# Patient Record
Sex: Female | Born: 1950 | Race: White | Hispanic: No | Marital: Single | Smoking: Never smoker
Health system: Southern US, Community
[De-identification: ages and names within clinical notes are randomized; demographics above are authoritative.]

## PROBLEM LIST (undated history)

## (undated) DIAGNOSIS — E46 Unspecified protein-calorie malnutrition: Secondary | ICD-10-CM

## (undated) DIAGNOSIS — D649 Anemia, unspecified: Secondary | ICD-10-CM

## (undated) DIAGNOSIS — E7800 Pure hypercholesterolemia, unspecified: Secondary | ICD-10-CM

## (undated) DIAGNOSIS — I5032 Chronic diastolic (congestive) heart failure: Secondary | ICD-10-CM

## (undated) DIAGNOSIS — J189 Pneumonia, unspecified organism: Secondary | ICD-10-CM

## (undated) DIAGNOSIS — E440 Moderate protein-calorie malnutrition: Secondary | ICD-10-CM

## (undated) DIAGNOSIS — J9601 Acute respiratory failure with hypoxia: Secondary | ICD-10-CM

## (undated) DIAGNOSIS — D72829 Elevated white blood cell count, unspecified: Secondary | ICD-10-CM

## (undated) DIAGNOSIS — R0902 Hypoxemia: Secondary | ICD-10-CM

## (undated) DIAGNOSIS — R002 Palpitations: Secondary | ICD-10-CM

## (undated) DIAGNOSIS — J849 Interstitial pulmonary disease, unspecified: Secondary | ICD-10-CM

## (undated) DIAGNOSIS — I7300 Raynaud's syndrome without gangrene: Secondary | ICD-10-CM

## (undated) DIAGNOSIS — I272 Pulmonary hypertension, unspecified: Secondary | ICD-10-CM

## (undated) DIAGNOSIS — R6510 Systemic inflammatory response syndrome (SIRS) of non-infectious origin without acute organ dysfunction: Secondary | ICD-10-CM

## (undated) HISTORY — DX: Chronic diastolic (congestive) heart failure: I50.32

## (undated) HISTORY — DX: Palpitations: R00.2

## (undated) HISTORY — DX: Elevated white blood cell count, unspecified: D72.829

## (undated) HISTORY — DX: Anemia, unspecified: D64.9

## (undated) HISTORY — DX: Raynaud's syndrome without gangrene: I73.00

## (undated) HISTORY — DX: Interstitial pulmonary disease, unspecified: J84.9

## (undated) HISTORY — DX: Hypoxemia: R09.02

## (undated) HISTORY — DX: Moderate protein-calorie malnutrition: E44.0

## (undated) HISTORY — DX: Pneumonia, unspecified organism: J18.9

## (undated) HISTORY — DX: Pure hypercholesterolemia, unspecified: E78.00

## (undated) HISTORY — DX: Unspecified protein-calorie malnutrition: E46

## (undated) HISTORY — DX: Systemic inflammatory response syndrome (sirs) of non-infectious origin without acute organ dysfunction: R65.10

## (undated) HISTORY — DX: Acute respiratory failure with hypoxia: J96.01

## (undated) HISTORY — DX: Pulmonary hypertension, unspecified: I27.20

---

## 1978-03-30 HISTORY — PX: TUBAL LIGATION: SHX77

## 2002-06-17 ENCOUNTER — Encounter: Payer: Self-pay | Admitting: Emergency Medicine

## 2002-06-17 ENCOUNTER — Emergency Department (HOSPITAL_COMMUNITY): Admission: EM | Admit: 2002-06-17 | Discharge: 2002-06-17 | Payer: Self-pay | Admitting: Emergency Medicine

## 2011-05-09 ENCOUNTER — Emergency Department (HOSPITAL_COMMUNITY): Payer: BC Managed Care – PPO

## 2011-05-09 ENCOUNTER — Emergency Department (HOSPITAL_COMMUNITY)
Admission: EM | Admit: 2011-05-09 | Discharge: 2011-05-09 | Disposition: A | Discharge status: Home / Self Care | Payer: BC Managed Care – PPO | Attending: Emergency Medicine | Admitting: Emergency Medicine

## 2011-05-09 DIAGNOSIS — R0602 Shortness of breath: Secondary | ICD-10-CM | POA: Insufficient documentation

## 2011-05-09 DIAGNOSIS — J189 Pneumonia, unspecified organism: Secondary | ICD-10-CM | POA: Insufficient documentation

## 2011-05-09 DIAGNOSIS — M549 Dorsalgia, unspecified: Secondary | ICD-10-CM | POA: Insufficient documentation

## 2011-05-09 DIAGNOSIS — R079 Chest pain, unspecified: Secondary | ICD-10-CM | POA: Insufficient documentation

## 2011-05-09 DIAGNOSIS — J8410 Pulmonary fibrosis, unspecified: Secondary | ICD-10-CM | POA: Insufficient documentation

## 2011-05-09 LAB — CBC
HCT: 37.9 % (ref 36.0–46.0)
Hemoglobin: 12.7 g/dL (ref 12.0–15.0)
MCH: 30.1 pg (ref 26.0–34.0)
MCHC: 33.5 g/dL (ref 30.0–36.0)
MCV: 89.8 fL (ref 78.0–100.0)
Platelets: 256 10*3/uL (ref 150–400)
RBC: 4.22 MIL/uL (ref 3.87–5.11)
RDW: 12 % (ref 11.5–15.5)
WBC: 8.8 10*3/uL (ref 4.0–10.5)

## 2011-05-09 LAB — BASIC METABOLIC PANEL
BUN: 13 mg/dL (ref 6–23)
CO2: 28 mEq/L (ref 19–32)
Calcium: 9 mg/dL (ref 8.4–10.5)
Chloride: 97 mEq/L (ref 96–112)
Creatinine, Ser: <0.47 mg/dL (ref 0.4–1.2)
Glucose, Bld: 92 mg/dL (ref 70–99)
Potassium: 4 mEq/L (ref 3.5–5.1)
Sodium: 135 mEq/L (ref 135–145)

## 2011-05-09 LAB — POCT CARDIAC MARKERS
CKMB, poc: <1 ng/mL — ABNORMAL LOW (ref 1.0–8.0)
Myoglobin, poc: 44.4 ng/mL (ref 12–200)
Troponin i, poc: <0.05 ng/mL (ref 0.00–0.09)

## 2011-05-09 LAB — DIFFERENTIAL
Basophils Absolute: 0 10*3/uL (ref 0.0–0.1)
Basophils Relative: 0 % (ref 0–1)
Eosinophils Absolute: 0.1 10*3/uL (ref 0.0–0.7)
Eosinophils Relative: 1 % (ref 0–5)
Lymphocytes Relative: 27 % (ref 12–46)
Lymphs Abs: 2.4 10*3/uL (ref 0.7–4.0)
Monocytes Absolute: 0.7 10*3/uL (ref 0.1–1.0)
Monocytes Relative: 8 % (ref 3–12)
Neutro Abs: 5.6 10*3/uL (ref 1.7–7.7)
Neutrophils Relative %: 64 % (ref 43–77)

## 2011-05-09 MED ORDER — IOHEXOL 300 MG/ML  SOLN
80.0000 mL | Freq: Once | INTRAMUSCULAR | Status: AC | PRN
  Administered 2011-05-09: 80 mL via INTRAVENOUS

## 2011-06-10 ENCOUNTER — Encounter: Payer: Self-pay | Admitting: Emergency Medicine

## 2011-06-13 ENCOUNTER — Ambulatory Visit (INDEPENDENT_AMBULATORY_CARE_PROVIDER_SITE_OTHER): Payer: BC Managed Care – PPO | Admitting: Emergency Medicine

## 2011-06-13 ENCOUNTER — Encounter: Payer: Self-pay | Admitting: Emergency Medicine

## 2011-06-13 ENCOUNTER — Other Ambulatory Visit: Payer: BC Managed Care – PPO

## 2011-06-13 VITALS — BP 108/66 | HR 69 | Temp 97.9°F | Ht 60.5 in | Wt 142.0 lb

## 2011-06-13 DIAGNOSIS — J849 Interstitial pulmonary disease, unspecified: Secondary | ICD-10-CM | POA: Insufficient documentation

## 2011-06-13 DIAGNOSIS — J84115 Respiratory bronchiolitis interstitial lung disease: Secondary | ICD-10-CM

## 2011-06-13 NOTE — Patient Instructions (Signed)
We will do bloodwork today Follow up with Dr Delton Coombes next available appointment to review the results.

## 2011-06-13 NOTE — Progress Notes (Signed)
  Subjective:    Patient ID: Julie Page, female    DOB: 1951-08-09, 60 y.o.   MRN: [id number]  HPI 60 yo never smoker, hx raynaud's, hypercholesterolemia. She has also been treated for CAP on at least 2 -3 occasions, last was May 2012. On that occasion she was rx Avelox. F/u spiro was normal at urgent care. CT-PA 05/09/11 showed some very subtle subpleural interstitial disease. She is referred regarding the CT scan. ROS significant for chronic intermittant dry cough.    Review of Systems  Respiratory: Positive for cough and shortness of breath.    Past Medical History  Diagnosis Date  . Raynaud's syndrome   . Hypercholesteremia      Family History  Problem Relation Age of Onset  . Heart disease Mother   . Heart disease Maternal Grandfather   . Heart disease Father      History   Social History  . Marital Status: Divorced    Spouse Name: N/A    Number of Children: N/A  . Years of Education: N/A   Occupational History  . bookkeeper/accountant    Social History Main Topics  . Smoking status: Never Smoker   . Smokeless tobacco: Not on file  . Alcohol Use: No     rare ETOH use  . Drug Use: No  . Sexually Active: Not on file   Other Topics Concern  . Not on file   Social History Narrative  . No narrative on file     No Known Allergies   No outpatient prescriptions prior to visit.         Objective:   Physical Exam Gen: Pleasant, well-nourished, in no distress,  normal affect  ENT: No lesions,  mouth clear,  oropharynx clear, no postnasal drip  Neck: No JVD, no TMG, no carotid bruits  Lungs: No use of accessory muscles, no dullness to percussion, clear without rales or rhonchi  Cardiovascular: RRR, heart sounds normal, no murmur or gallops, no peripheral edema  Musculoskeletal: No deformities, no cyanosis or clubbing  Neuro: alert, non focal  Skin: Warm, no lesions or rashes        Assessment & Plan:  Interstitial lung disease Will order  auto-immune labs ROV next available to review

## 2011-06-13 NOTE — Assessment & Plan Note (Signed)
Will order auto-immune labs ROV next available to review

## 2011-06-14 LAB — ANA: Anti Nuclear Antibody(ANA): NEGATIVE

## 2011-06-17 ENCOUNTER — Telehealth: Payer: Self-pay | Admitting: Emergency Medicine

## 2011-06-17 NOTE — Telephone Encounter (Signed)
Discussed lab results w pt - all the autoimmune labs are negative w exception of borderline elevated RF. In absence of joint sx significance of this is questionable. I will see her back to decide if any other eval needed for this, and to plan timing of further imaging.

## 2011-06-28 ENCOUNTER — Ambulatory Visit: Payer: BC Managed Care – PPO | Admitting: Emergency Medicine

## 2011-07-19 ENCOUNTER — Ambulatory Visit (INDEPENDENT_AMBULATORY_CARE_PROVIDER_SITE_OTHER): Payer: BC Managed Care – PPO | Admitting: Emergency Medicine

## 2011-08-09 ENCOUNTER — Ambulatory Visit (INDEPENDENT_AMBULATORY_CARE_PROVIDER_SITE_OTHER): Payer: BC Managed Care – PPO | Admitting: Emergency Medicine

## 2011-08-09 ENCOUNTER — Encounter: Payer: Self-pay | Admitting: Emergency Medicine

## 2011-08-09 VITALS — BP 116/78 | HR 88 | Temp 97.8°F | Ht 60.5 in | Wt 145.2 lb

## 2011-08-09 DIAGNOSIS — J849 Interstitial pulmonary disease, unspecified: Secondary | ICD-10-CM

## 2011-08-09 DIAGNOSIS — J84115 Respiratory bronchiolitis interstitial lung disease: Secondary | ICD-10-CM

## 2011-08-09 NOTE — Patient Instructions (Signed)
We will perform full pulmonary function testing on the date of your next visit We will repeat your CXR or your CT scan in several months to compare with your prior Follow up with Dr Delton Coombes next available with PFT's

## 2011-08-09 NOTE — Assessment & Plan Note (Signed)
Full PFT RF slightly elevated, ? Significance Will repeat CXR or CT scan, determine timing based on her clinical status rov next available.

## 2011-08-09 NOTE — Progress Notes (Signed)
  Subjective:    Patient ID: Julie Page, female    DOB: Oct 12, 1951, 60 y.o.   MRN: [id number]  HPI 60 yo never smoker, hx raynaud's, hypercholesterolemia. She has also been treated for CAP on at least 2 -3 occasions, last was May 2012. On that occasion she was rx Avelox. F/u spiro was normal at urgent care. CT-PA 05/09/11 showed some very subtle subpleural interstitial disease. She is referred regarding the CT scan. ROS significant for chronic intermittant dry cough.   ROV 08/09/11 -- follows up for her mild ILD on CT scan. Auto-immune labs negative with exception of RF of 24 (very mildly elevated).  She has not had any arthritic symptoms. She has continued to have cough, especially in the am productive of clear or yellow mucous. She hears some inspiratory wheeze with exertion.  Seems to happen at random, often in the am. She c/o fatigue since our last visit.    Review of Systems  Respiratory: Positive for cough and shortness of breath.       Objective:   Physical Exam Gen: Pleasant, well-nourished, in no distress,  normal affect  ENT: No lesions,  mouth clear,  oropharynx clear, no postnasal drip  Neck: No JVD, no TMG, no carotid bruits  Lungs: No use of accessory muscles, no dullness to percussion, clear without rales or rhonchi  Cardiovascular: RRR, heart sounds normal, no murmur or gallops, no peripheral edema  Musculoskeletal: No deformities, no cyanosis or clubbing  Neuro: alert, non focal  Skin: Warm, no lesions or rashes     Assessment & Plan:

## 2011-09-08 ENCOUNTER — Encounter: Payer: Self-pay | Admitting: Emergency Medicine

## 2011-09-08 ENCOUNTER — Ambulatory Visit (INDEPENDENT_AMBULATORY_CARE_PROVIDER_SITE_OTHER): Payer: BC Managed Care – PPO | Admitting: Emergency Medicine

## 2011-09-08 VITALS — BP 138/72 | HR 85 | Temp 97.7°F | Ht 60.5 in | Wt 143.8 lb

## 2011-09-08 DIAGNOSIS — J84115 Respiratory bronchiolitis interstitial lung disease: Secondary | ICD-10-CM

## 2011-09-08 DIAGNOSIS — J849 Interstitial pulmonary disease, unspecified: Secondary | ICD-10-CM

## 2011-09-08 LAB — PULMONARY FUNCTION TEST

## 2011-09-08 NOTE — Assessment & Plan Note (Signed)
PFT reassuring - repeat CT scan in April or may 2013 - rov to review after

## 2011-09-08 NOTE — Patient Instructions (Signed)
Your pulmonary function testing today was normal We will repeat your CT scan of the chest in April 2013 Follow up with Dr Delton Coombes in April after your scan, or sooner if you have any problems.

## 2011-09-08 NOTE — Progress Notes (Signed)
PFT done today. 

## 2011-09-08 NOTE — Progress Notes (Signed)
  Subjective:    Patient ID: Julie Page, female    DOB: 16-Oct-1951, 60 y.o.   MRN: [id number]  HPI 60 yo never smoker, hx raynaud's, hypercholesterolemia. She has also been treated for CAP on at least 2 -3 occasions, last was May 2012. On that occasion she was rx Avelox. F/u spiro was normal at urgent care. CT-PA 05/09/11 showed some very subtle subpleural interstitial disease. She is referred regarding the CT scan. ROS significant for chronic intermittant dry cough.   ROV 08/09/11 -- follows up for her mild ILD on CT scan. Auto-immune labs negative with exception of RF of 24 (very mildly elevated).  She has not had any arthritic symptoms. She has continued to have cough, especially in the am productive of clear or yellow mucous. She hears some inspiratory wheeze with exertion.  Seems to happen at random, often in the am. She c/o fatigue since our last visit.   ROV 09/08/11 -- follow up for SOB, mild ILD (mild RF+). She tells me that she still has exertional SOB w stairs and hills. Still has cough, usually worst in am, often productive. She is beginning to get some URI sx, sore throat, has sick contact at work. She had PFT today - show normal AF, no BD response, normal volumes, DLCO that corrects for volumes.    Review of Systems  Respiratory: Positive for cough and shortness of breath.       Objective:   Physical Exam Gen: Pleasant, well-nourished, in no distress,  normal affect  ENT: No lesions,  mouth clear,  oropharynx clear, no postnasal drip  Neck: No JVD, no TMG, no carotid bruits  Lungs: No use of accessory muscles, no dullness to percussion, clear without rales or rhonchi  Cardiovascular: RRR, heart sounds normal, no murmur or gallops, no peripheral edema  Musculoskeletal: No deformities, no cyanosis or clubbing  Neuro: alert, non focal  Skin: Warm, no lesions or rashes     Assessment & Plan:  Interstitial lung disease PFT reassuring - repeat CT scan in April or may  2013 - rov to review after

## 2012-03-07 ENCOUNTER — Ambulatory Visit (INDEPENDENT_AMBULATORY_CARE_PROVIDER_SITE_OTHER): Payer: BC Managed Care – PPO | Admitting: Internal Medicine

## 2012-03-07 VITALS — BP 123/73 | HR 101 | Temp 98.1°F | Resp 16 | Ht 61.0 in | Wt 141.4 lb

## 2012-03-07 DIAGNOSIS — J209 Acute bronchitis, unspecified: Secondary | ICD-10-CM

## 2012-03-07 DIAGNOSIS — J40 Bronchitis, not specified as acute or chronic: Secondary | ICD-10-CM

## 2012-03-07 MED ORDER — AZITHROMYCIN 250 MG PO TABS: ORAL_TABLET | ORAL | Status: AC

## 2012-03-07 MED ORDER — HYDROCODONE-ACETAMINOPHEN 7.5-500 MG/15ML PO SOLN: 5.0000 mL | Freq: Four times a day (QID) | ORAL | Status: AC | PRN

## 2012-03-07 NOTE — Progress Notes (Signed)
  Subjective:    Patient ID: Julie Page, female    DOB: [birth year], 61 y.o.   MRN: [id number]  HPI Has fever, cough, sputum yellow, head congestion, sinus. No CP or SOB. OX 96%   Review of Systems Neg    Objective:   Physical Exam Nasal congestion Lungs coarse bs no rales. Heart normal        Assessment & Plan:  Zpak and Lortab elixir 6oz prn

## 2012-03-07 NOTE — Patient Instructions (Signed)

## 2012-03-15 ENCOUNTER — Telehealth: Payer: Self-pay | Admitting: Emergency Medicine

## 2012-03-15 NOTE — Telephone Encounter (Signed)
Spoke with pt and she states just needing to reschedule her ct chest for a later date. I gave her the number to call Rose and reschedule. Pt verbalized understanding and states nothing further needed. Will forward to [facility] per her request to check and make sure she reschedules. Thanks!

## 2012-03-19 ENCOUNTER — Ambulatory Visit (INDEPENDENT_AMBULATORY_CARE_PROVIDER_SITE_OTHER): Payer: BC Managed Care – PPO | Admitting: Family Medicine

## 2012-03-19 VITALS — BP 141/72 | HR 81 | Temp 98.9°F | Resp 16 | Ht 59.38 in | Wt 141.6 lb

## 2012-03-19 DIAGNOSIS — R5381 Other malaise: Secondary | ICD-10-CM

## 2012-03-19 DIAGNOSIS — R05 Cough: Secondary | ICD-10-CM

## 2012-03-19 DIAGNOSIS — R5383 Other fatigue: Secondary | ICD-10-CM

## 2012-03-19 DIAGNOSIS — J029 Acute pharyngitis, unspecified: Secondary | ICD-10-CM

## 2012-03-19 LAB — POCT CBC
Granulocyte percent: 58.5 %G (ref 37–80)
MCH, POC: 29.2 pg (ref 27–31.2)
MCV: 92.1 fL (ref 80–97)
MID (cbc): 0.7 (ref 0–0.9)
MPV: 8.3 fL (ref 0–99.8)
POC LYMPH PERCENT: 35.7 %L (ref 10–50)
POC MID %: 5.8 %M (ref 0–12)
Platelet Count, POC: 510 10*3/uL — AB (ref 142–424)
RBC: 4.15 M/uL (ref 4.04–5.48)
RDW, POC: 13.2 %

## 2012-03-19 MED ORDER — IPRATROPIUM BROMIDE 0.03 % NA SOLN: 2.0000 | Freq: Four times a day (QID) | NASAL | Status: DC

## 2012-03-19 NOTE — Progress Notes (Signed)
Patient Name: Julie Page Date of Birth: [birth year] Medical Record Number: [id number] Gender: female Date of Encounter: 03/19/2012  History of Present Illness:  Julie Page is a 61 y.o. very pleasant female patient who presents with the following:  Here on 03/07/12 and treated for bronchitis with a zpack. She had felt better, but never got to 100%.  Started to feel worse- tired, coughing- over the last 3 days.  Then yesterday noted onset of ST and PND.  Felt a little tight in her chest.  No fever that she has noticed.  No aches, no body aches.  Cough is dry- not productive.  No GI symptoms.  No runny nose, no sneezing.   Patient Active Problem List  Diagnoses  . Interstitial lung disease   Past Medical History  Diagnosis Date  . Raynaud's syndrome   . Hypercholesteremia    Past Surgical History  Procedure Date  . Tubal ligation 03/30/1978   History  Substance Use Topics  . Smoking status: Never Smoker   . Smokeless tobacco: Never Used  . Alcohol Use: No     rare ETOH use   Family History  Problem Relation Age of Onset  . Heart disease Mother   . Heart disease Maternal Grandfather   . Heart disease Father    No Known Allergies  Medication list has been reviewed and updated.  Review of Systems: As per HPI- otherwise negative. Main issue today is just feeling tired and bad  Physical Examination: Filed Vitals:   03/19/12 1442  BP: 141/72  Pulse: 81  Temp: 98.9 F (37.2 C)  TempSrc: Oral  Resp: 16  Height: 4' 11.38" (1.508 m)  Weight: 141 lb 9.6 oz (64.229 kg)   O2 sat 100% Body mass index is 28.23 kg/(m^2).  GEN: WDWN, NAD, Non-toxic, A & O x 3, looks well HEENT: Atraumatic, Normocephalic. Neck supple. No masses, No LAD.  TM, oropharynx wnl Ears and Nose: No external deformity. CV: RRR, No M/G/R. No JVD. No thrill. No extra heart sounds. PULM: CTA B, no wheezes, crackles, rhonchi. No retractions. No resp. distress. No accessory muscle use. EXTR:  No c/c/e NEURO Normal gait.  PSYCH: Normally interactive. Conversant. Not depressed or anxious appearing.  Calm demeanor.   Results for orders placed in visit on 03/19/12  POCT INFLUENZA A/B      Component Value Range   Influenza A, POC Negative     Influenza B, POC Negative    POCT RAPID STREP A (OFFICE)      Component Value Range   Rapid Strep A Screen Negative  Negative   POCT CBC      Component Value Range   WBC 11.3 (*) 4.6 - 10.2 (K/uL)   Lymph, poc 4.0 (*) 0.6 - 3.4    POC LYMPH PERCENT 35.7  10 - 50 (%L)   MID (cbc) 0.7  0 - 0.9    POC MID % 5.8  0 - 12 (%M)   POC Granulocyte 6.6  2 - 6.9    Granulocyte percent 58.5  37 - 80 (%G)   RBC 4.15  4.04 - 5.48 (M/uL)   Hemoglobin 12.1 (*) 12.2 - 16.2 (g/dL)   HCT, POC 40.9  81.1 - 47.9 (%)   MCV 92.1  80 - 97 (fL)   MCH, POC 29.2  27 - 31.2 (pg)   MCHC 31.7 (*) 31.8 - 35.4 (g/dL)   RDW, POC 91.4     Platelet Count, POC 510 (*) 142 -  424 (K/uL)   MPV 8.3  0 - 99.8 (fL)     Assessment and Plan: 1. Fatigue  POCT Influenza A/B, POCT rapid strep A, POCT CBC  2. Cough  POCT Influenza A/B, POCT CBC  3. Sore throat     likley viral URI.  DMM, atrovent nasal prn. Buy thermometer and check temp.  If not feeling better within a couple of days please call- Sooner if worse.   Recommended a recheck CBC at her next blood draw to recheck platelets and wbc

## 2012-03-21 NOTE — Telephone Encounter (Signed)
Spoke with pt and she decided to keep appt for ct on 03/22/12.

## 2012-03-22 ENCOUNTER — Ambulatory Visit (INDEPENDENT_AMBULATORY_CARE_PROVIDER_SITE_OTHER)
Admission: RE | Admit: 2012-03-22 | Discharge: 2012-03-22 | Disposition: A | Discharge status: Home / Self Care | Payer: BC Managed Care – PPO | Source: Ambulatory Visit | Attending: Emergency Medicine | Admitting: Emergency Medicine

## 2012-03-22 DIAGNOSIS — J849 Interstitial pulmonary disease, unspecified: Secondary | ICD-10-CM

## 2012-03-22 DIAGNOSIS — J8410 Pulmonary fibrosis, unspecified: Secondary | ICD-10-CM

## 2012-03-29 ENCOUNTER — Telehealth: Payer: Self-pay | Admitting: Emergency Medicine

## 2012-03-29 NOTE — Telephone Encounter (Signed)
Pt calling for ct results.  Please advise. thanks

## 2012-03-29 NOTE — Telephone Encounter (Signed)
Called and spoke with pt and she is aware of ct results per RB.  She is aware of appt on 4/22 pt to arrive at 8:45.  Pt is aware. Nothing further needed.

## 2012-03-29 NOTE — Telephone Encounter (Signed)
lori can you give me an appt for this pt to come in and see RB to discuss her ct results.  thanks

## 2012-03-29 NOTE — Telephone Encounter (Signed)
Please inform patient that her CT scan shows some slight progression of scarring and inflammation compared with 04/2011. She needs to make follow up with me so we can discuss, decide whether we need to do any other evaluation of this.

## 2012-04-09 ENCOUNTER — Ambulatory Visit (INDEPENDENT_AMBULATORY_CARE_PROVIDER_SITE_OTHER): Payer: BC Managed Care – PPO | Admitting: Emergency Medicine

## 2012-04-09 ENCOUNTER — Encounter: Payer: Self-pay | Admitting: Emergency Medicine

## 2012-04-09 ENCOUNTER — Other Ambulatory Visit: Payer: BC Managed Care – PPO

## 2012-04-09 VITALS — BP 100/64 | HR 93 | Temp 99.0°F | Ht 60.5 in | Wt 140.8 lb

## 2012-04-09 DIAGNOSIS — J849 Interstitial pulmonary disease, unspecified: Secondary | ICD-10-CM

## 2012-04-09 DIAGNOSIS — R05 Cough: Secondary | ICD-10-CM

## 2012-04-09 DIAGNOSIS — J8410 Pulmonary fibrosis, unspecified: Secondary | ICD-10-CM

## 2012-04-09 MED ORDER — OMEPRAZOLE 20 MG PO CPDR: 20.0000 mg | DELAYED_RELEASE_CAPSULE | Freq: Every day | ORAL | Status: DC

## 2012-04-09 NOTE — Assessment & Plan Note (Signed)
She relates to when she went to new workplace 7 yrs ago, has had episodes pleurisy, PNA x 2. Now the CT scan that shows peripheral ILD. Her RF is elevated, other auto-immune labs negative.  - will try to send sputum for cx, if unsuccessful then consider FOB for BAL - strongly consider VATS bx to eval ILD.  - resend RF and consider referral to rheum or empiric steroids.

## 2012-04-09 NOTE — Patient Instructions (Signed)
Please start taking omeprazole 20mg  daily until next visit We will recheck rheumatoid factor today We will refer to thoracici surgery to discuss the possible benefits of a lung biopsy Follow with Dr Delton Coombes in 1 month

## 2012-04-09 NOTE — Progress Notes (Signed)
Subjective:    Patient ID: Julie Page, female    DOB: [birth year], 61 y.o.   MRN: [id number]  HPI 61 yo never smoker, hx raynaud's, hypercholesterolemia. She has also been treated for CAP on at least 2 -3 occasions, last was May 2012. On that occasion she was rx Avelox. F/u spiro was normal at urgent care. CT-PA 05/09/11 showed some very subtle subpleural interstitial disease. She is referred regarding the CT scan. ROS significant for chronic intermittant dry cough.   ROV 08/09/11 -- follows up for her mild ILD on CT scan. Auto-immune labs negative with exception of RF of 24 (very mildly elevated).  She has not had any arthritic symptoms. She has continued to have cough, especially in the am productive of clear or yellow mucous. She hears some inspiratory wheeze with exertion.  Seems to happen at random, often in the am. She c/o fatigue since our last visit.   ROV 09/08/11 -- follow up for SOB, mild ILD (mild RF+). She tells me that she still has exertional SOB w stairs and hills. Still has cough, usually worst in am, often productive. She is beginning to get some URI sx, sore throat, has sick contact at work. She had PFT today - show normal AF, no BD response, normal volumes, DLCO that corrects for volumes.   ROV 04/09/12  --  follow up for SOB, mild ILD (mild RF+). CT scan was done 03/22/12 that showed some slight progression of peripheral ILD with some associated bronchiectasis. PFT 09/08/11 =  normal AF, no BD response, normal volumes, DLCO that corrects for volumes. Tells me that she caught URI and had bronchitis sx in March, rx azithro. Her breathing is stable, still with DOE when climbing stairs. She is having some more cough, mainly in the am usually non-productive. Can relate to meals, no overt aspiration sx but then 10-15 minutes later after meals.   Sputum vs FOB ? Re[peat autoimmune labs/RF Repeat Ct after steroid trial   Review of Systems  Respiratory: Positive for cough and shortness of  breath.       Objective:   Physical Exam Gen: Pleasant, well-nourished, in no distress,  normal affect  ENT: No lesions,  mouth clear,  oropharynx clear, no postnasal drip  Neck: No JVD, no TMG, no carotid bruits  Lungs: No use of accessory muscles, no dullness to percussion, clear without rales or rhonchi  Cardiovascular: RRR, heart sounds normal, no murmur or gallops, no peripheral edema  Musculoskeletal: No deformities, no cyanosis or clubbing  Neuro: alert, non focal  Skin: Warm, no lesions or rashes  CT scan chest 03/22/12: IMPRESSION:  1. Mildly progressive peripheral reticular interstitial  accentuation, with mild associated bronchiectasis. The appearance  raises concern for usual interstitial pneumonitis, potentially  wrist dilated to idiopathic pulmonary fibrosis or collagen vascular  disease. The main differential diagnostic consideration is  nonspecific interstitial pneumonia.  2. Faint mosaic attenuation and portions of the right lung with  expiration, suggesting mild air trapping and raising the  possibility of mild associated respiratory bronchiolitis.  2. Borderline enlarged right paratracheal lymph nodes, potentially  reactive.     Assessment & Plan:  Chronic cough Happens after meals about 10-52min. No PND at this time. No real GERD sx.   Interstitial lung disease She relates to when she went to new workplace 7 yrs ago, has had episodes pleurisy, PNA x 2. Now the CT scan that shows peripheral ILD. Her RF is elevated, other auto-immune labs negative.  -  will try to send sputum for cx, if unsuccessful then consider FOB for BAL - strongly consider VATS bx to eval ILD.  - resend RF and consider referral to rheum or empiric steroids.

## 2012-04-09 NOTE — Assessment & Plan Note (Addendum)
Happens after meals about 10-73min. No PND at this time. No real GERD sx.

## 2012-04-12 ENCOUNTER — Encounter: Payer: Self-pay | Admitting: Cardiothoracic Surgery

## 2012-04-12 ENCOUNTER — Institutional Professional Consult (permissible substitution) (INDEPENDENT_AMBULATORY_CARE_PROVIDER_SITE_OTHER): Payer: BC Managed Care – PPO | Admitting: Cardiothoracic Surgery

## 2012-04-12 VITALS — BP 155/86 | HR 110 | Resp 20 | Ht 60.05 in | Wt 140.0 lb

## 2012-04-12 DIAGNOSIS — J8410 Pulmonary fibrosis, unspecified: Secondary | ICD-10-CM

## 2012-04-12 DIAGNOSIS — J849 Interstitial pulmonary disease, unspecified: Secondary | ICD-10-CM

## 2012-04-12 NOTE — Patient Instructions (Signed)
Idiopathic Pulmonary Fibrosis Idiopathic pulmonary fibrosis is an inflammation (soreness and irritation) in the lungs which eventually causes scarring. This usually shows in middle age over a several year time period. The cause is unknown (idiopathic). Usually death occurs after several years but there are no time tables which will predict perfectly what the course of the illness will be. It affects males and females equally. In this condition there is a formation of fibrous (tough leathery) tissue in the small ducts which carry air to and from your lungs. Because of this, the lungs do not work as well as they should for taking in oxygen from the air you breathe and getting rid of wastes (carbon dioxide). Because the lungs are damaged, there may be more problems with infections or the heart.   Other problems may include pulmonary hypertension (high blood pressure in the lungs), and formation of blood clots. Some symptoms of this illness are:  Coughing and breathing difficulties; cough is usually dry and hacking.   Bluish (cyanotic) skin and lips due to lack of circulating oxygen.   Loss of appetite.   Loss of strength which comes from just the increased work of breathing.   Rapid, shallow breathing occur with moderate exercise and later even while resting.   Increasing shortness of breath (dyspnea) which progresses as the disease gets worse.   Weight loss and fatigue due partly to the increased work of breathing.   Clubbing of the fingers (the ends of the fingers become rounded and enlarged).  DIAGNOSIS   A diagnosis of idiopathic pulmonary fibrosis may be suspected based on exam and a patient's history.  Specialized X-rays, pulmonary function tests, pulse oximetry, and laboratory tests including blood gasses help confirm the problem.   Sometimes a biopsy is done and a small piece of lung tissue is removed. This may be done through a bronchoscope or during an operation in which the chest is  opened. This is looked at under a microscope by a specialist who can tell what the lung problem is.  CAUSES If the cause of pulmonary fibrosis is known, it is no longer known as idiopathic. Several causes of pulmonary fibrosis include:  Occupational and environmental exposures to asbestos, silica, and or metal dusts.   Illegal or street drug use.   Agricultural workers may inhale substances, such as moldy hay, which can cause an allergic reaction in the lung. This reaction is called Farmer's Lung and can cause pulmonary fibrosis. Some other fumes found on farms are directly toxic to the lungs.   Exposure of the lungs to radiation.   Collagen diseases.   Sarcoidosis is a disease which forms granulomas (areas of inflammatory cells), which can attack any area of the body but most frequently affects the lungs.   Drugs. Certain medicines may have the undesirable side effect of causing pulmonary fibrosis. Check with your doctor about the medicines you are taking and ask about any possible side effects.   Some cases of pulmonary fibrosis seem to be genetic.  TREATMENT    There are no drugs currently approved for the treatment of pulmonary fibrosis. Steroids (a potent medication which cuts down on inflammation) are sometimes given to prevent lung changes before they become permanent. High doses may be recommended at first, followed by lower maintenance dosages. Other medications may be tried if steroids do not work.   Lung disease may be monitored with X-rays and laboratory work.   Oxygen may be helpful if oxygen in the blood is diminished.  This improves the quality of life. Your caregiver will give you a prescription for this if it is helpful.   Antibiotics are used for treatment of infections.   Exercise may be beneficial.   Lung transplants are being investigated and a single lung transplant may be considered for some patients.   Influenza vaccine and pneumococcal pneumonia vaccine are  both recommended for people with IPF or any lung disease. These two shots may help keep you healthy.  Document Released: 02/25/2004 Document Revised: 11/24/2011 Document Reviewed: 12/05/2005 [facility] Patient Information 2012 [place], [place].Thoracoscopy Thoracoscopy is a procedure in which a thin, lighted tube (thoracoscope) is put through a small cut (incision) in the chest wall. This procedure makes it possible for your caregiver to look at the lungs or other structures in the chest cavity and to do some minor operations. This is a more minor procedure than thoracotomy, which opens the chest cavity with a large incision. Thoracoscopy can sometimes be used instead of thoracotomy. Thoracoscopy usually involves less pain, a shorter hospital stay, and a shorter recovery time. Common reasons for this procedure are:  To study diseases or problems in the chest.   To take a tissue sample (biopsy) to study under a microscope.   To put medicines directly into the lungs.   To remove collections of fluid, pus (empyema), or blood in the chest.  LET YOUR CAREGIVER KNOW ABOUT:    Allergies to food or medicine.   Medicines taken, including vitamins, herbs, eyedrops, over-the-counter medicines, and creams.   Use of steroids (by mouth or creams).   Previous problems with anesthetics or numbing medicines.   History of bleeding problems or blood clots.   Previous surgery.   Any history of heart problems.   Other health problems, including diabetes and kidney problems.   Possibility of pregnancy, if this applies.  RISKS AND COMPLICATIONS    If too much bleeding occurs, or if the surgery turns out to be major, it may be necessary to open the chest (thoracotomy) to control the bleeding.   There is a risk of injury to nerves or other structures in the chest as a result of the placement of the instruments.   When the chest tube is removed, the lung may collapse (pneumothorax). If this happens, the  tube may need to be reinserted and left in place until a time when the lung will remain expanded as the tube is removed.  BEFORE THE PROCEDURE    You may have routine tests done, such as blood tests, urine tests, and chest X-rays.   Electrocardiography (EKG) to record the electrical activity of the heart may be done to make sure the heart is okay.   Do not eat or drink after midnight the night before the procedure. Medicine given before the procedure that makes you sleep (general anesthetic) may cause vomiting. A patient who vomits is in danger of inhaling food into the lungs. This can cause serious complications and can be life-threatening.  PROCEDURE   Video-assisted thoracic surgery (VATS) is surgery using a thoracoscope with a small video camera on the end. The picture from inside the chest is displayed on a television screen for the surgeon to see. The lung being worked on is collapsed and several small incisions are made to insert instruments. The surgeon can manipulate the instruments while watching on the television screen. The thoracoscope may be removed and put into different areas as needed. When the procedure is finished, the surgeon expands the lung and  puts one or more chest tubes in the chest. The chest tubes allow the lung to expand and allow fluid (drainage) to come out. The remaining incisions are closed with stitches (sutures) or staples. AFTER THE PROCEDURE    The chest tube is left in place for 1 to several days to drain fluid or air from the chest cavity.   Hospital stays range from 1 to 5 days depending on the procedure and treatment.  Document Released: 09/03/2003 Document Revised: 11/24/2011 Document Reviewed: 05/25/2011 [facility] Patient Information 2012 [place], [place].Thoracoscopy Care After Refer to this sheet in the next few weeks. These discharge instructions provide you with general information on caring for yourself after you leave the hospital. Your caregiver may  also give you specific instructions. Your treatment has been planned according to the most current medical practices available, but unavoidable complications sometimes occur. If you have any problems or questions after discharge, call your caregiver. HOME CARE INSTRUCTIONS    Remove the bandage (dressing) over your chest tube site as directed by your caregiver.   It is normal to be sore for a couple weeks following surgery. See your caregiver if this seems to be getting worse rather than better.   Only take over-the-counter or prescription medicines for pain, discomfort, or fever as directed by your caregiver. It is very important to take pain medicine when you need it so that you will cough and breathe deeply enough to clear mucus (phlegm) and expand your lungs.   If it hurts to cough, hold a pillow against your chest when you cough. This may help with the discomfort. In spite of the discomfort, cough frequently, as this helps protect against getting an infection in your lung (pneumonia).   Taking deep breaths keeps lungs inflated and protects against pneumonia. Most patients will go home with an incentive spirometer that encourages deep breathing.   You may resume a normal diet and activities as directed.   Use showers for bathing until you see your caregiver, or as instructed.   Change dressings if necessary or as directed.   Avoid lifting or driving until you are instructed otherwise.   Make an appointment to see your caregiver for stitch (suture) or staple removal when instructed.   Do not travel by airplane for 2 weeks after the chest tube is removed.  SEEK MEDICAL CARE IF:    You are bleeding from your wounds.   You have redness, swelling, or increasing pain in the wounds.   Your heartbeat feels irregular or very fast.   There is pus coming from your wounds.   There is a bad smell coming from the wound or dressing.  SEEK IMMEDIATE MEDICAL CARE IF:    You have a fever.    You develop a rash.   You have difficulty breathing.   You develop any reaction or side effects to medicines given.   You develop lightheadedness or feel faint.   You develop shortness of breath or chest pain.  MAKE SURE YOU:    Understand these instructions.   Will watch your condition.   Will get help right away if you are not doing well or get worse.  Document Released: 06/24/2005 Document Revised: 11/24/2011 Document Reviewed: 05/25/2011 [facility] Patient Information 2012 [place], [place].

## 2012-04-13 NOTE — Progress Notes (Signed)
[address]            [place] [address]          [phone]      Julie Page [facility] Medical Record #[id number] Date of Birth: 03-08-1951  Referring: Leslye Peer, MD Primary Care: Abbe Amsterdam, MD, MD  Chief Complaint:    Chief Complaint  Patient presents with  . Interstitial Lung Disease    Referral from Dr Delton Coombes for eval on ILD and poss VATS    History of Present Illness:    Patient is a 61 year old nonsmoker with several year history of increasing cough and radiographic findings suggestive of interstitial lung disease. She's been followed in the pulmonary clinic and because of progressive symptoms has been referred for consideration of video-assisted thoracoscopy for lung biopsy. She denies hemoptysis, has never been a smoker. She does have a history of  Raynaud's phenomenon which started about 4 years previous.      Current Activity/ Functional Status: Patient is independent with mobility/ambulation, transfers, ADL's, IADL's.   Past Medical History  Diagnosis Date  . Raynaud's syndrome   . Hypercholesteremia     Past Surgical History  Procedure Date  . Tubal ligation 03/30/1978    Family History  Problem Relation Age of Onset  . Heart disease Mother Died mi 89  . Heart disease Maternal Grandfather   . Heart disease Father Died mi 38    History   Social History  . Marital Status: Divorced    Spouse Name: N/A    Number of Children: Two sons in good health  . Years of Education: N/A   Occupational History  . bookkeeper/accountant    Social History Main Topics  . Smoking status: Never Smoker   . Smokeless tobacco: Never Used  . Alcohol Use: No     rare ETOH use  . Drug Use: No   Other Topics Concern  . Not on file   Social History Narrative  . Patient son lives with her    History  Smoking status  . Never Smoker   Smokeless tobacco  . Never Used    History  Alcohol Use No    rare ETOH  use     No Known Allergies  Current Outpatient Prescriptions  Medication Sig Dispense Refill  . Ascorbic Acid (VITAMIN C) 1000 MG tablet Take 1,000 mg by mouth daily.        Marland Kitchen omeprazole (PRILOSEC) 20 MG capsule Take 1 capsule (20 mg total) by mouth daily.  30 capsule  11       Review of Systems:     Cardiac Review of Systems: Y or N  Chest Pain [  n  ]  Resting SOB [ n  ] Exertional SOB  Cove.Etienne  ]  Orthopnea [ n ]   Pedal Edema [  n ]    Palpitations [ n ] Syncope  [n  ]   Presyncope [n   ]  General Review of Systems: [Y] = yes [  ]=no Constitional: recent weight change [ n ]; anorexia [ n ]; fatigue [ y ]; nausea [ n ]; night sweats [n  ]; fever [n  ]; or chills [ n ];  Dental: poor dentition[ n ];   Eye : blurred vision [n  ]; diplopia [   ]; vision changes [  ];  Amaurosis fugax[n  ]; Resp: cough [  ];  wheezing[  ];  hemoptysis[  ]; shortness of breath[  ]; paroxysmal nocturnal dyspnea[ n ]; dyspnea on exertion[y  ]; or orthopnea[n  ];  GI:  gallstones[  ], vomiting[  ];  dysphagia[  ]; melena[  ];  hematochezia [  ]; heartburn[  ];   Hx of  Colonoscopy[  ]; GU: kidney stones [  ]; hematuria[  ];   dysuria [  ];  nocturia[  ];  history of     obstruction [n  ];             Skin: rash, swelling[  ];, hair loss[  ];  peripheral edema[  ];  or itching[  ]; blanching Musculosketetal: myalgias[  ];  joint swelling[  ];  joint erythema[  ];  joint pain[  ];  back pain[  ];  Heme/Lymph: bruising[  ];  bleeding[  ];  anemia[  ];  Neuro: TIA[  ];  headaches[y  ];  stroke[  ];  vertigo[  ];  seizures[n  ];   paresthesias[  ];  difficulty walking[n  ];  Psych:depression[  ]; anxiety[  ];  Endocrine: diabetes[ n ];  thyroid dysfunction[  ];  Immunizations: Flu [ y ]; Pneumococcal[n  ];  Other:  Physical Exam: BP 155/86  Pulse 110  Resp 20  Ht 5' 0.05"  (1.525 m)  Wt 140 lb (63.504 kg)  BMI 27.30 kg/m2  SpO2 97%  General appearance: alert, cooperative, appears stated age and no distress Neurologic: intact Heart: regular rate and rhythm, S1, S2 normal, no murmur, click, rub or gallop and normal apical impulse Lungs: clear to auscultation bilaterally and normal percussion bilaterally Abdomen: soft, non-tender; bowel sounds normal; no masses,  no organomegaly Extremities: extremities normal, atraumatic, no cyanosis or edema and Homans sign is negative, no sign of DVT Patient has no carotid bruits, no cervical or supraclavicular or axillary adenopathy, she has palpable pedal pulses   Diagnostic Studies & Laboratory data:     Recent Radiology Findings:  Ct Chest Wo Contrast  03/22/2012  *RADIOLOGY REPORT*  Clinical Data: Interstitial lung disease.  History of hypertension.  CT CHEST WITHOUT CONTRAST  Technique:  Multidetector CT imaging of the chest was performed following the standard protocol without IV contrast.  Comparison: 05/09/2011  Findings: A right paratracheal node on image 17 of series 2 has a short axis diameter of 0.9 cm.  A lower right paratracheal node has a short axis diameter 1.0 cm.  No obvious hilar adenopathy although small hilar nodes could be obscured due to the lack of IV contrast and adjacent pulmonary vasculature.  Peripheral coarse interstitial accentuation is present bilaterally, with faint associated nodularity anteriorly. Mild bilateral cylindrical bronchiectasis noted, for example in the right middle lobe on image 17 of series 8.  Excretory images demonstrate mild air trapping in the multiple secondary pulmonary lobules, particularly in the right lung.  Overall the peripheral interstitial accentuation appears faintly increased compared to 05/09/2011.  Mild scarring noted along the minor fissure.  IMPRESSION:  1.  Mildly progressive peripheral reticular interstitial accentuation, with mild associated bronchiectasis.  The  appearance raises concern for usual interstitial pneumonitis, potentially wrist dilated to idiopathic pulmonary fibrosis or collagen vascular disease. The main differential diagnostic consideration is nonspecific interstitial pneumonia. 2.  Faint  mosaic attenuation and portions of the right lung with expiration, suggesting mild air trapping and raising the possibility of mild associated respiratory bronchiolitis. 2.  Borderline enlarged right paratracheal lymph nodes, potentially reactive.  Original Report Authenticated By: Dellia Cloud, M.D.    Recent Lab Findings: Lab Results  Component Value Date   WBC 11.3* 03/19/2012   HGB 12.1* 03/19/2012   HCT 38.2 03/19/2012   PLT 256 05/09/2011   GLUCOSE 92 05/09/2011   NA 135 05/09/2011   K 4.0 05/09/2011   CL 97 05/09/2011   CREATININE <0.47 05/09/2011   BUN 13 05/09/2011   CO2 28 05/09/2011   RF  Has increased from 24 to 59 during past year ANA neg   Assessment / Plan:    #1 chronic cough and exertional shortness of breath with radiographic evidence of progressive interstitial lung disease #2 onset of Raynaud phenomenon approximately 4 years ago  I agree with Dr. Delton Coombes that with the patients progressive radiographic changes and increasing fatigue and exertional shortness of breath proceeding with video-assisted thoracoscopy and lung biopsy is indicated to make a tissue diagnosis if she does have interstitial lung disease to better define therapy and prognosis. The risks and options of obtaining lung biopsy by nVATS and the need to obtain enough lung tissue to recognize pathologic architecture is discussed with the patient. She is willing to proceed.   The patient has called this morning and notes that Dr. Delton Coombes would like to delay obtaining a lung biopsy until after she sees a rheumatologist. We will wait to schedule surgery until after we hear back from Dr. Delton Coombes .   Delight Ovens MD  Beeper [phone] Office [phone] 04/13/2012 1:33  PM

## 2012-04-17 ENCOUNTER — Telehealth: Payer: Self-pay | Admitting: Emergency Medicine

## 2012-04-17 DIAGNOSIS — R768 Other specified abnormal immunological findings in serum: Secondary | ICD-10-CM

## 2012-04-17 DIAGNOSIS — J849 Interstitial pulmonary disease, unspecified: Secondary | ICD-10-CM

## 2012-04-17 NOTE — Telephone Encounter (Signed)
Per Dr. Delton Coombes, the pt needs referral to [name] at [place] and needs to have VATS bx with Tyrone Sage. Per Dr. Delton Coombes, he never told the pt to cancel bx or not have this done.  Will contact the pt regarding this matter.

## 2012-04-18 NOTE — Telephone Encounter (Signed)
Rheumatology referral sent to [facility] and pt is aware. Pt states she would like to hold off on the biopsy until after she sees the Rheumatologist and sees what they have to offer or say. Any surgery would put her out of work for several weeks. Will forward to Dr. Delton Coombes so he is aware.

## 2012-04-19 NOTE — Telephone Encounter (Signed)
Thank you :)

## 2012-04-23 ENCOUNTER — Other Ambulatory Visit: Payer: BC Managed Care – PPO

## 2012-04-23 DIAGNOSIS — J849 Interstitial pulmonary disease, unspecified: Secondary | ICD-10-CM

## 2012-04-24 ENCOUNTER — Ambulatory Visit: Payer: BC Managed Care – PPO | Admitting: Emergency Medicine

## 2012-04-26 LAB — RESPIRATORY CULTURE OR RESPIRATORY AND SPUTUM CULTURE: Gram Stain: NONE SEEN

## 2012-05-01 ENCOUNTER — Telehealth: Payer: Self-pay | Admitting: Emergency Medicine

## 2012-05-01 NOTE — Telephone Encounter (Signed)
I called the pt to r/s her appt from 05-10-12. She states she brought by a sputum sample and wants the results. Please advise on results. Carron Curie, CMA

## 2012-05-02 ENCOUNTER — Encounter: Payer: Self-pay | Admitting: Emergency Medicine

## 2012-05-02 NOTE — Telephone Encounter (Signed)
The sputum sample didn't grow out anything, was more consistent with mouth flora (maybe not a deep enough sample). Need to see her to discuss next steps - either FOB or possibly back to [facility] for VATS.

## 2012-05-02 NOTE — Telephone Encounter (Signed)
[email or web address].Julie Page, CMA  

## 2012-05-02 NOTE — Telephone Encounter (Signed)
Pt returned call.  Advised of sputum results / recs as stated by RB.  Pt verbalized her understanding and does not want to come in for appt at this time.  She had "extensive blood work done" and will follow up with Dr [name] regarding this on 5.30.13.  Pt would like to decide what she would like to do once she has that visit.  Pt stated she does not want surgery and prefers not to have bronch done.  As patient will call after her next appt with Dr [name], will sign and forward to RB as FYI.

## 2012-05-09 ENCOUNTER — Inpatient Hospital Stay: Admit: 2012-05-09 | Payer: Self-pay | Admitting: Cardiothoracic Surgery

## 2012-05-09 SURGERY — BRONCHOSCOPY, VIDEO-ASSISTED
Anesthesia: General | Site: Chest | Laterality: Right

## 2012-05-10 ENCOUNTER — Ambulatory Visit: Payer: BC Managed Care – PPO | Admitting: Emergency Medicine

## 2012-06-06 ENCOUNTER — Ambulatory Visit (INDEPENDENT_AMBULATORY_CARE_PROVIDER_SITE_OTHER): Payer: BC Managed Care – PPO | Admitting: Family Medicine

## 2012-06-06 VITALS — BP 102/65 | HR 85 | Temp 97.7°F | Resp 17 | Ht 61.0 in | Wt 138.0 lb

## 2012-06-06 DIAGNOSIS — R5383 Other fatigue: Secondary | ICD-10-CM

## 2012-06-06 DIAGNOSIS — R5381 Other malaise: Secondary | ICD-10-CM

## 2012-06-06 DIAGNOSIS — J029 Acute pharyngitis, unspecified: Secondary | ICD-10-CM

## 2012-06-06 DIAGNOSIS — R05 Cough: Secondary | ICD-10-CM

## 2012-06-06 LAB — POCT CBC
Hemoglobin: 14 g/dL (ref 12.2–16.2)
Lymph, poc: 2.8 (ref 0.6–3.4)
MCH, POC: 29.9 pg (ref 27–31.2)
MCHC: 32 g/dL (ref 31.8–35.4)
MID (cbc): 0.6 (ref 0–0.9)
MPV: 9.1 fL (ref 0–99.8)
POC Granulocyte: 3.8 (ref 2–6.9)
POC MID %: 8.8 %M (ref 0–12)
Platelet Count, POC: 328 10*3/uL (ref 142–424)
WBC: 7.2 10*3/uL (ref 4.6–10.2)

## 2012-06-06 LAB — POCT RAPID STREP A (OFFICE): Rapid Strep A Screen: NEGATIVE

## 2012-06-06 NOTE — Progress Notes (Signed)
Patient Name: Julie Page Date of Birth: 1951-07-17 Medical Record Number: [id number] Gender: female Date of Encounter: 06/06/2012  History of Present Illness:  Julie Page is a 61 y.o. very pleasant female patient who presents with the following:  Notes onset of ST Sunday (today is Wednesday), also cough, sneeze, fatigue, and her right ear feels slightly painful.  No GI symptoms, no fever that she has noted.  She has had to miss work and needs a note. Wants to be sure this not more than just a virus.   Patient Active Problem List  Diagnosis  . Interstitial lung disease  . Chronic cough   Past Medical History  Diagnosis Date  . Raynaud's syndrome   . Hypercholesteremia    Past Surgical History  Procedure Date  . Tubal ligation 03/30/1978   History  Substance Use Topics  . Smoking status: Never Smoker   . Smokeless tobacco: Never Used  . Alcohol Use: No     rare ETOH use   Family History  Problem Relation Age of Onset  . Heart disease Mother   . Heart disease Maternal Grandfather   . Heart disease Father    No Known Allergies  Medication list has been reviewed and updated.  Prior to Admission medications   Medication Sig Start Date End Date Taking? Authorizing Provider  Vitamin D, Ergocalciferol, (DRISDOL) 50000 UNITS CAPS Take 50,000 Units by mouth.   Yes Historical Provider, MD  Ascorbic Acid (VITAMIN C) 1000 MG tablet Take 1,000 mg by mouth daily.      Historical Provider, MD  omeprazole (PRILOSEC) 20 MG capsule Take 1 capsule (20 mg total) by mouth daily. 04/09/12 04/09/13  Leslye Peer, MD    Review of Systems:  As per HPI- otherwise negative.   Physical Examination: Filed Vitals:   06/06/12 1550  BP: 102/65  Pulse: 85  Temp: 97.7 F (36.5 C)  Resp: 17   Filed Vitals:   06/06/12 1550  Height: 5\' 1"  (1.549 m)  Weight: 138 lb (62.596 kg)   Body mass index is 26.07 kg/(m^2). Ideal Body Weight: Weight in (lb) to have BMI = 25: 132    GEN: WDWN, NAD, Non-toxic, A & O x 3 HEENT: Atraumatic, Normocephalic. Neck supple. No masses, No LAD.  TM and oropharynx wnl, PEERL Ears and Nose: No external deformity. CV: RRR, No M/G/R. No JVD. No thrill. No extra heart sounds. PULM: CTA B, no wheezes, crackles, rhonchi. No retractions. No resp. distress. No accessory muscle use. ABD: S, NT, ND, +BS. No rebound. No HSM. EXTR: No c/c/e NEURO Normal gait.  PSYCH: Normally interactive. Conversant. Not depressed or anxious appearing.  Calm demeanor.   Results for orders placed in visit on 06/06/12  POCT CBC      Component Value Range   WBC 7.2  4.6 - 10.2 K/uL   Lymph, poc 2.8  0.6 - 3.4   POC LYMPH PERCENT 39.1  10 - 50 %L   MID (cbc) 0.6  0 - 0.9   POC MID % 8.8  0 - 12 %M   POC Granulocyte 3.8  2 - 6.9   Granulocyte percent 52.1  37 - 80 %G   RBC 4.68  4.04 - 5.48 M/uL   Hemoglobin 14.0  12.2 - 16.2 g/dL   HCT, POC 47.4  25.9 - 47.9 %   MCV 93.3  80 - 97 fL   MCH, POC 29.9  27 - 31.2 pg  MCHC 32.0  31.8 - 35.4 g/dL   RDW, POC 16.1     Platelet Count, POC 328  142 - 424 K/uL   MPV 9.1  0 - 99.8 fL  POCT RAPID STREP A (OFFICE)      Component Value Range   Rapid Strep A Screen Negative  Negative    Assessment and Plan: 1. Fatigue  POCT CBC  2. Cough    3. Sore throat  POCT rapid strep A   Suspect viral URI.  Reassurance, rest, fluids, Patient (or parent if minor) instructed to return to clinic or call if not better in 2-3 day(s). Gave note for her job.    Abbe Amsterdam, MD

## 2012-06-19 ENCOUNTER — Encounter: Payer: Self-pay | Admitting: Family Medicine

## 2012-12-19 DIAGNOSIS — Z0271 Encounter for disability determination: Secondary | ICD-10-CM

## 2013-03-27 ENCOUNTER — Ambulatory Visit: Payer: BC Managed Care – PPO

## 2013-03-27 ENCOUNTER — Ambulatory Visit (INDEPENDENT_AMBULATORY_CARE_PROVIDER_SITE_OTHER): Payer: BC Managed Care – PPO | Admitting: Family Medicine

## 2013-03-27 VITALS — BP 125/77 | HR 92 | Temp 98.2°F | Resp 16 | Ht 60.0 in | Wt 145.0 lb

## 2013-03-27 DIAGNOSIS — R5381 Other malaise: Secondary | ICD-10-CM

## 2013-03-27 DIAGNOSIS — R5383 Other fatigue: Secondary | ICD-10-CM

## 2013-03-27 DIAGNOSIS — R05 Cough: Secondary | ICD-10-CM

## 2013-03-27 LAB — COMPREHENSIVE METABOLIC PANEL
ALT: 12 U/L (ref 0–35)
Albumin: 4.3 g/dL (ref 3.5–5.2)
CO2: 29 mEq/L (ref 19–32)
Chloride: 102 mEq/L (ref 96–112)
Glucose, Bld: 99 mg/dL (ref 70–99)
Potassium: 4.2 mEq/L (ref 3.5–5.3)
Sodium: 138 mEq/L (ref 135–145)
Total Bilirubin: 0.3 mg/dL (ref 0.3–1.2)
Total Protein: 7.5 g/dL (ref 6.0–8.3)

## 2013-03-27 LAB — POCT CBC
Granulocyte percent: 59.8 %G (ref 37–80)
HCT, POC: 41.5 % (ref 37.7–47.9)
Hemoglobin: 13 g/dL (ref 12.2–16.2)
MCV: 92.9 fL (ref 80–97)
POC Granulocyte: 6.6 (ref 2–6.9)
RBC: 4.47 M/uL (ref 4.04–5.48)
RDW, POC: 13.7 %

## 2013-03-27 LAB — GLUCOSE, POCT (MANUAL RESULT ENTRY): POC Glucose: 82 mg/dl (ref 70–99)

## 2013-03-27 LAB — TSH: TSH: 2.324 u[IU]/mL (ref 0.350–4.500)

## 2013-03-27 MED ORDER — DOXYCYCLINE HYCLATE 100 MG PO TABS: 100.0000 mg | ORAL_TABLET | Freq: Two times a day (BID) | ORAL | Status: DC

## 2013-03-27 NOTE — Progress Notes (Signed)
[facility] and [facility] [address] [place] [address] [phone]  Date:  03/27/2013   Name:  Julie Page   DOB:  08-09-1951   MRN:  [id number]  PCP:  Abbe Amsterdam, MD    Chief Complaint: Extremity Weakness and Cough   History of Present Illness:  Julie Page is a 63 y.o. very pleasant female patient who presents with the following:  She is feeling "weak and shaky," and has noted a worse than usual cough for about one week.    Feeling weak for the last 3 or 4 days- she describes this as being fatigued all over, no particular weakness.  She would like to lie down and sleep, but has been going to work.   She has not taken her temperature, but has not noted any subjective fever.  She does get chills but this is typical for her- "I'm always cold."    She has noted body aches, her back hurts.  Her chest "burns" when she coughs but no other chest pains.   She did have a ST last week.  No stuffy nose, but her nose is running. Her cough is minimally productive No GI symptoms. She is eating normally    She has tried aspirin as needed for a HA that she had a couple of days ago.  No other medications used  Patient Active Problem List  Diagnosis  . Interstitial lung disease  . Chronic cough    Past Medical History  Diagnosis Date  . Raynaud's syndrome   . Hypercholesteremia     Past Surgical History  Procedure Laterality Date  . Tubal ligation  03/30/1978    History  Substance Use Topics  . Smoking status: Never Smoker   . Smokeless tobacco: Never Used  . Alcohol Use: No     Comment: rare ETOH use    Family History  Problem Relation Age of Onset  . Heart disease Mother   . Heart disease Maternal Grandfather   . Heart disease Father     No Known Allergies  Medication list has been reviewed and updated.  Current Outpatient Prescriptions on File Prior to Visit  Medication Sig Dispense Refill  . Ascorbic Acid (VITAMIN C) 1000 MG tablet  Take 1,000 mg by mouth daily.        Marland Kitchen omeprazole (PRILOSEC) 20 MG capsule Take 1 capsule (20 mg total) by mouth daily.  30 capsule  11  . Vitamin D, Ergocalciferol, (DRISDOL) 50000 UNITS CAPS Take 50,000 Units by mouth.       No current facility-administered medications on file prior to visit.    Review of Systems:  As per HPI- otherwise negative.   Physical Examination: Filed Vitals:   03/27/13 1541  BP: 125/77  Pulse: 92  Temp: 98.2 F (36.8 C)  Resp: 16   Filed Vitals:   03/27/13 1541  Height: 5' (1.524 m)  Weight: 145 lb (65.772 kg)   Body mass index is 28.32 kg/(m^2). Ideal Body Weight: Weight in (lb) to have BMI = 25: 127.7  GEN: WDWN, NAD, Non-toxic, A & O x 3 HEENT: Atraumatic, Normocephalic. Neck supple. No masses, No LAD. Ears and Nose: No external deformity. CV: RRR, No M/G/R. No JVD. No thrill. No extra heart sounds. PULM: CTA B, mild ronchi and wheezes bilaterally. No retractions. No resp. distress. No accessory muscle use. ABD: S, NT, ND, +BS. No rebound. No HSM. EXTR: No c/c/e NEURO Normal gait.  PSYCH: Normally interactive. Conversant.  Not depressed or anxious appearing.  Calm demeanor.   Results for orders placed in visit on 03/27/13  POCT CBC      Result Value Range   WBC 11.0 (*) 4.6 - 10.2 K/uL   Lymph, poc 3.6 (*) 0.6 - 3.4   POC LYMPH PERCENT 32.9  10 - 50 %L   MID (cbc) 0.8  0 - 0.9   POC MID % 7.3  0 - 12 %M   POC Granulocyte 6.6  2 - 6.9   Granulocyte percent 59.8  37 - 80 %G   RBC 4.47  4.04 - 5.48 M/uL   Hemoglobin 13.0  12.2 - 16.2 g/dL   HCT, POC 16.1  09.6 - 47.9 %   MCV 92.9  80 - 97 fL   MCH, POC 29.1  27 - 31.2 pg   MCHC 31.3 (*) 31.8 - 35.4 g/dL   RDW, POC 04.5     Platelet Count, POC 377  142 - 424 K/uL   MPV 8.1  0 - 99.8 fL  GLUCOSE, POCT (MANUAL RESULT ENTRY)      Result Value Range   POC Glucose 82  70 - 99 mg/dl   [facility] reading (PRIMARY) by  Dr. Patsy Lager. CXR: history of interstitial lung disease.   Hazy left heart  border  CHEST - 2 VIEW  Comparison: Chest CT 03/22/2012 and earlier.  Findings: Since 2012, progression of diffuse increased interstitial markings with peripheral and basilar predominantce. Slightly lower lung volumes. Cardiac size and mediastinal contours are within normal limits. Visualized tracheal air column is within normal limits. No pneumothorax. No pleural effusion. No consolidation or strong evidence of pulmonary edema. No acute osseous abnormality identified.  IMPRESSION: Constellation of findings most compatible with progressive interstitial lung disease since 2012. No superimposed acute findings are suspected.  Assessment and Plan: Cough - Plan: POCT CBC, POCT glucose (manual entry), DG Chest 2 View, doxycycline (VIBRA-TABS) 100 MG tablet  Other malaise and fatigue - Plan: POCT glucose (manual entry), Comprehensive metabolic panel, TSH, Vitamin D, 25-hydroxy  Julie Page is here today with increased cough and feeling of malaise.  Will treat with doxycycline for possible lung infection.  She is to let me know if not better in the next few days- Sooner if worse.    Signed Abbe Amsterdam, MD  Called and [facility] when I received radiology OR report- most consistent with progression of her ILD.  I will send a note to her pulmonologist, please let me know if not better in the next couple of days- Sooner if worse.

## 2013-03-27 NOTE — Patient Instructions (Addendum)
Please be sure to let me know if your are feeling worse.  Fill the antibiotic and use as directed.  I will be in touch with the rest of your labs soon

## 2013-03-28 ENCOUNTER — Encounter: Payer: Self-pay | Admitting: Family Medicine

## 2013-03-31 ENCOUNTER — Telehealth: Payer: Self-pay | Admitting: Family Medicine

## 2013-03-31 NOTE — Telephone Encounter (Signed)
Called but Nene did not pick up.  LMOM asking her to let me know if not better.  Will send letter.

## 2013-03-31 NOTE — Telephone Encounter (Signed)
Message copied by Pearline Cables on Sun Mar 31, 2013  6:12 PM ------      Message from: Leslye Peer      Created: Fri Mar 29, 2013  3:32 PM       Yes thank very much. I'd like to see her if not improving.       Rob Delton Coombes      ----- Message -----         From: Pearline Cables, MD         Sent: 03/27/2013   8:00 PM           To: Leslye Peer, MD            Maren Reamer saw Julie Page in clinic today for malaise and increased cough.  We are treating her with doxycyline.  I received the following CXR report:            CHEST - 2 VIEW            Comparison: Chest CT 03/22/2012 and earlier.            Findings: Since 2012, progression of diffuse increased interstitial      markings with peripheral and basilar predominantce.  Slightly lower      lung volumes.  Cardiac size and mediastinal contours are within      normal limits.  Visualized tracheal air column is within normal      limits.  No pneumothorax.  No pleural effusion.  No consolidation      or strong evidence of pulmonary edema.  No acute osseous      abnormality identified.            IMPRESSION:      Constellation of findings most compatible with progressive      interstitial lung disease since 2012. No superimposed acute      findings are suspected.                  Please let me know if you would like me to have Julie Page schedule a follow-up appt to see you.  I have asked her to let me know if she is not feeling better in the next couple of days.            Best regards,            Abbe Amsterdam,                    ------

## 2013-10-24 ENCOUNTER — Ambulatory Visit: Payer: Self-pay

## 2013-10-24 ENCOUNTER — Ambulatory Visit: Payer: BC Managed Care – PPO

## 2013-10-24 ENCOUNTER — Other Ambulatory Visit: Payer: Self-pay | Admitting: Family Medicine

## 2013-10-24 ENCOUNTER — Ambulatory Visit (INDEPENDENT_AMBULATORY_CARE_PROVIDER_SITE_OTHER): Payer: BC Managed Care – PPO | Admitting: Family Medicine

## 2013-10-24 VITALS — BP 126/68 | HR 91 | Temp 98.1°F | Resp 18 | Ht 61.5 in | Wt 143.0 lb

## 2013-10-24 DIAGNOSIS — J849 Interstitial pulmonary disease, unspecified: Secondary | ICD-10-CM

## 2013-10-24 DIAGNOSIS — R05 Cough: Secondary | ICD-10-CM

## 2013-10-24 DIAGNOSIS — R079 Chest pain, unspecified: Secondary | ICD-10-CM

## 2013-10-24 DIAGNOSIS — R0781 Pleurodynia: Secondary | ICD-10-CM

## 2013-10-24 DIAGNOSIS — J8410 Pulmonary fibrosis, unspecified: Secondary | ICD-10-CM

## 2013-10-24 NOTE — Progress Notes (Signed)
[facility] [address], [place] [address] [phone]  Date:  10/24/2013   Name:  Julie Page   DOB:  06-17-1951   MRN:  [id number]  PCP:  Abbe Amsterdam, MD    Chief Complaint: Cough   History of Present Illness:  Julie Page is a 62 y.o. very pleasant female patient who presents with the following:  History of interstitial lung disease.  She is a pt of Dr. Delton Coombes with [facility] Last week (about one week prior to today) she noted some pain in her back- this was worse on Monday of this week.  She stayed home for 2 days to rest but this morning she noted the pain was persistent so she came in for evaluation She is coughing and wheezing more than usual.  When she coughs or takes a deep breath the area hurts.  NKI.   No fever, chills or malaise.   Cough is non - productive except for occasional phlegm. She may feel SOB with climbing stairs- however this is her baseline.  At rest and if not taking deep breaths she feels fine.   She noted some wheezing over the last couple of days.  She has tried an inhaler in the past but it has never helped her.    No CP.  No GI symptoms. No URI sx.   No antipyretics today or yesterday Patient Active Problem List   Diagnosis Date Noted  . Chronic cough 04/09/2012  . Interstitial lung disease 06/13/2011    Past Medical History  Diagnosis Date  . Raynaud's syndrome   . Hypercholesteremia     Past Surgical History  Procedure Laterality Date  . Tubal ligation  03/30/1978    History  Substance Use Topics  . Smoking status: Never Smoker   . Smokeless tobacco: Never Used  . Alcohol Use: No     Comment: rare ETOH use    Family History  Problem Relation Age of Onset  . Heart disease Mother   . Heart disease Maternal Grandfather   . Heart disease Father     No Known Allergies  Medication list has been reviewed and updated.  Current Outpatient Prescriptions on File Prior to Visit   Medication Sig Dispense Refill  . Ascorbic Acid (VITAMIN C) 1000 MG tablet Take 1,000 mg by mouth daily.        . Vitamin D, Ergocalciferol, (DRISDOL) 50000 UNITS CAPS Take 50,000 Units by mouth.      . doxycycline (VIBRA-TABS) 100 MG tablet Take 1 tablet (100 mg total) by mouth 2 (two) times daily.  20 tablet  0   No current facility-administered medications on file prior to visit.    Review of Systems:  As per HPI- otherwise negative.   Physical Examination: Filed Vitals:   10/24/13 0858  BP: 126/68  Pulse: 91  Temp: 98.1 F (36.7 C)  Resp: 18   Filed Vitals:   10/24/13 0858  Height: 5' 1.5" (1.562 m)  Weight: 143 lb (64.864 kg)   Body mass index is 26.59 kg/(m^2). Ideal Body Weight: Weight in (lb) to have BMI = 25: 134.2  GEN: WDWN, NAD, Non-toxic, A & O x 3, looks well HEENT: Atraumatic, Normocephalic. Neck supple. No masses, No LAD.  Bilateral TM wnl, oropharynx normal.  PEERL,EOMI.   Ears and Nose: No external deformity. CV: RRR, No M/G/R. No JVD. No thrill. No extra heart sounds. PULM: CTA B, no wheezes, crackles, rhonchi. No retractions. No  resp. distress. No accessory muscle use. ABD: S, NT, ND EXTR: No c/c/e NEURO Normal gait.  PSYCH: Normally interactive. Conversant. Not depressed or anxious appearing.  Calm demeanor.  Able to reproduce tenderness along her right lateral ribs.  There is no rash or lesion to suggest shingles.    [facility] reading (PRIMARY) by  Dr. Patsy Lager. ZOX:WRUEAV interstitial lung disease  Right ribs: negative  CHEST - 1 VIEW  COMPARISON: Study read in conjunction with the rib series performed on the same date.  FINDINGS: Stable changes of interstitial fibrosis. Lungs are clear. No pleural effusion.  The bony thorax is intact.  IMPRESSION: No active disease.  RIGHT RIBS AND CHEST - 3+ VIEW  COMPARISON: 03/27/2013  FINDINGS: No rib fracture or rib lesion.  Heart, mediastinum and hila are unremarkable.  Changes of  interstitial fibrosis are stable. No acute findings in the lungs. No pleural effusion or pneumothorax.  IMPRESSION: No rib fracture or rib lesion. No acute findings.  Assessment and Plan: Cough - Plan: CANCELED: DG Chest 2 View  Interstitial lung disease - Plan: CANCELED: DG Chest 2 View  Rib pain on right side - Plan: DG Ribs Unilateral W/Chest Right  Her x-rays are reassuring.  Suspect that she has MSK pain in her right ribs given her negative films and reproducible pain.  She does not desire any particular pain medication at this time and would prefer watchful waiting.  If she is not doing better soon or if she is getting worse she will let me know.    Signed Abbe Amsterdam, MD

## 2013-10-24 NOTE — Patient Instructions (Signed)
Give me a call if you do not feel better.  Try some ibuprofen if you would like.

## 2013-12-05 ENCOUNTER — Ambulatory Visit (INDEPENDENT_AMBULATORY_CARE_PROVIDER_SITE_OTHER): Payer: BC Managed Care – PPO | Admitting: Family Medicine

## 2013-12-05 VITALS — BP 116/62 | HR 98 | Temp 98.8°F | Resp 16 | Ht 60.5 in | Wt 139.6 lb

## 2013-12-05 DIAGNOSIS — R05 Cough: Secondary | ICD-10-CM

## 2013-12-05 DIAGNOSIS — J029 Acute pharyngitis, unspecified: Secondary | ICD-10-CM

## 2013-12-05 MED ORDER — CEFDINIR 300 MG PO CAPS: 300.0000 mg | ORAL_CAPSULE | Freq: Two times a day (BID) | ORAL | Status: DC

## 2013-12-05 NOTE — Patient Instructions (Signed)
Use the omnicef antibiotic as directed.  Let me know if you are not feeling better in the next few days- Sooner if worse.   Rest and drink plenty of fluids

## 2013-12-05 NOTE — Progress Notes (Addendum)
[facility] and [facility] [address] [place] [address] [phone]  Date:  12/05/2013   Name:  Julie Page   DOB:  Mar 24, 1951   MRN:  [id number]  PCP:  Abbe Amsterdam, MD    Chief Complaint: Cough, Fever and Sore Throat   History of Present Illness:  Julie Page is a 62 y.o. very pleasant female patient who presents with the following:  Today is Thursday. She noted onset of a ST on Saturday, then started coughing on Monday.  She has noted a subjective fever, her throat feels congested.  She will cough out some yellow sometimes.   She has been taking aspirin.   Sx are worse in the am and at night. She has noted some chills, feels exhausted.   No GI symptoms.   She last took aspirin this AM.   She has noted some ear pain- they feel congested.    Patient Active Problem List   Diagnosis Date Noted  . Chronic cough 04/09/2012  . Interstitial lung disease 06/13/2011    Past Medical History  Diagnosis Date  . Raynaud's syndrome   . Hypercholesteremia     Past Surgical History  Procedure Laterality Date  . Tubal ligation  03/30/1978    History  Substance Use Topics  . Smoking status: Never Smoker   . Smokeless tobacco: Never Used  . Alcohol Use: No     Comment: rare ETOH use    Family History  Problem Relation Age of Onset  . Heart disease Mother   . Heart disease Maternal Grandfather   . Heart disease Father     No Known Allergies  Medication list has been reviewed and updated.  Current Outpatient Prescriptions on File Prior to Visit  Medication Sig Dispense Refill  . Ascorbic Acid (VITAMIN C) 1000 MG tablet Take 1,000 mg by mouth daily.        . Vitamin D, Ergocalciferol, (DRISDOL) 50000 UNITS CAPS Take 50,000 Units by mouth.       No current facility-administered medications on file prior to visit.    Review of Systems:  As per HPI- otherwise negative.   Physical Examination: Filed Vitals:   12/05/13 1453  BP: 116/62   Pulse: 98  Temp: 98.8 F (37.1 C)  Resp: 16   Filed Vitals:   12/05/13 1453  Height: 5' 0.5" (1.537 m)  Weight: 139 lb 9.6 oz (63.322 kg)   Body mass index is 26.8 kg/(m^2). Ideal Body Weight: Weight in (lb) to have BMI = 25: 129.9  GEN: WDWN, NAD, Non-toxic, A & O x 3.   HEENT: Atraumatic, Normocephalic. Neck supple. No masses, No LAD.  Bilateral TM wnl, oropharynx shows slight exudate bilaterally.  PEERL,EOMI.   Ears and Nose: No external deformity. CV: RRR, No M/G/R. No JVD. No thrill. No extra heart sounds. PULM: CTA B, no wheezes, crackles, rhonchi. No retractions. No resp. distress. No accessory muscle use. ABD: S, NT, ND, +BS. No rebound. No HSM. EXTR: No c/c/e NEURO Normal gait.  PSYCH: Normally interactive. Conversant. Not depressed or anxious appearing.  Calm demeanor.   Results for orders placed in visit on 12/05/13  POCT INFLUENZA A/B      Result Value Range   Influenza A, POC Negative     Influenza B, POC Negative    POCT RAPID STREP A (OFFICE)      Result Value Range   Rapid Strep A Screen Negative  Negative    Assessment and Plan:  Acute pharyngitis - Plan: POCT rapid strep A, Culture, Group A Strep, cefdinir (OMNICEF) 300 MG capsule  Cough - Plan: POCT Influenza A/B, Culture, Group A Strep  Pharyngitis- possibly bacterial or strep.  Will treat with omnicef.  Await culture. Rest, antipyretics prn.   See patient instructions for more details.    Signed Abbe Amsterdam, MD  12/21: called with her throat culture.  She is not as bad as she had been but still not well.  She will see how she is in the am and come in if not improved.  Fever seems to have resolved but ST remains.  Due to negative culture and lack of improvement with omincef suspect a viral infection

## 2013-12-07 LAB — CULTURE, GROUP A STREP: Organism ID, Bacteria: NORMAL

## 2014-10-09 ENCOUNTER — Ambulatory Visit (INDEPENDENT_AMBULATORY_CARE_PROVIDER_SITE_OTHER): Payer: 59

## 2014-10-09 ENCOUNTER — Other Ambulatory Visit: Payer: Self-pay | Admitting: Family Medicine

## 2014-10-09 ENCOUNTER — Ambulatory Visit (INDEPENDENT_AMBULATORY_CARE_PROVIDER_SITE_OTHER): Payer: 59 | Admitting: Family Medicine

## 2014-10-09 VITALS — BP 108/60 | HR 93 | Temp 98.3°F | Resp 18 | Ht 61.0 in | Wt 131.0 lb

## 2014-10-09 DIAGNOSIS — R05 Cough: Secondary | ICD-10-CM

## 2014-10-09 DIAGNOSIS — R52 Pain, unspecified: Secondary | ICD-10-CM

## 2014-10-09 DIAGNOSIS — R053 Chronic cough: Secondary | ICD-10-CM

## 2014-10-09 MED ORDER — GUAIFENESIN-CODEINE 100-10 MG/5ML PO SYRP: 5.0000 mL | ORAL_SOLUTION | Freq: Three times a day (TID) | ORAL | Status: DC | PRN

## 2014-10-09 NOTE — Progress Notes (Signed)
[facility] [address], [place] [id number] [phone]  Date:  10/09/2014   Name:  Julie Page   DOB:  01/14/1951   MRN:  [id number]  PCP:  Abbe AmsterdamOPLAND,Bradd Merlos, MD    Chief Complaint: Flank Pain and Cough   History of Present Illness:  Julie Page is a 63 y.o. very pleasant female patient who presents with the following:  Here today with possible rib injury.  Today is Thursday- on Monday she noted a dull pain in her right side/ flank. This got worse with cough.  She tried to rest, and has used aspirin.  These interventions have helped, but her pain will get worse if she coughs.  Her cough has been stable- more in the am and at night. A couple of weeks ago she had a cold which did seem to make her cough more than usual.  She is over her cold sx now She does generally cough on a regular basis due to her Insterstitial lung disease.  She feels like her breathing is as good as it always is now.  Cough is generally dry.   No urinary sx, bowels are working well. Her appetite is not as good as usual but she is still eating.  No more fever that she has noted  She has had a tubal ligation but no other operations.   Patient Active Problem List   Diagnosis Date Noted  . Chronic cough 04/09/2012  . Interstitial lung disease 06/13/2011    Past Medical History  Diagnosis Date  . Raynaud's syndrome   . Hypercholesteremia     Past Surgical History  Procedure Laterality Date  . Tubal ligation  03/30/1978    History  Substance Use Topics  . Smoking status: Never Smoker   . Smokeless tobacco: Never Used  . Alcohol Use: No     Comment: rare ETOH use    Family History  Problem Relation Age of Onset  . Heart disease Mother   . Heart disease Maternal Grandfather   . Heart disease Father     No Known Allergies  Medication list has been reviewed and updated.  Current Outpatient Prescriptions on File Prior to Visit  Medication Sig Dispense Refill   . Ascorbic Acid (VITAMIN C) 1000 MG tablet Take 1,000 mg by mouth daily.        . Vitamin D, Ergocalciferol, (DRISDOL) 50000 UNITS CAPS Take 50,000 Units by mouth.      . cefdinir (OMNICEF) 300 MG capsule Take 1 capsule (300 mg total) by mouth 2 (two) times daily.  20 capsule  0   No current facility-administered medications on file prior to visit.    Review of Systems:  As per HPI- otherwise negative.   Physical Examination: Filed Vitals:   10/09/14 0958  BP: 108/60  Pulse: 93  Temp: 98.3 F (36.8 C)  Resp: 18   Filed Vitals:   10/09/14 0958  Height: 5\' 1"  (1.549 m)  Weight: 131 lb (59.421 kg)   Body mass index is 24.76 kg/(m^2). Ideal Body Weight: Weight in (lb) to have BMI = 25: 132  GEN: WDWN, NAD, Non-toxic, A & O x 3, looks well HEENT: Atraumatic, Normocephalic. Neck supple. No masses, No LAD. Ears and Nose: No external deformity. CV: RRR, No M/G/R. No JVD. No thrill. No extra heart sounds. PULM: CTA B, no wheezes, crackles, rhonchi. No retractions. No resp. distress. No accessory muscle use. ABD: S, NT, ND, +BS. No rebound. No  HSM. EXTR: No c/c/e NEURO Normal gait.  PSYCH: Normally interactive. Conversant. Not depressed or anxious appearing.  Calm demeanor.  Reproducible tenderness over the right lateral ribs.  No skin findings   [facility] reading (PRIMARY) by  Dr. Patsy Lagerop[place]. Right ribs: negative CXR:  Negative  RIGHT RIBS - 2 VIEW  COMPARISON: 03/27/2013, 10/24/2013  FINDINGS: Moderate diffuse interstitial change, most prominent at the lung bases, stable from 2014. Heart size and vascular pattern are normal. No effusion.  Bony thorax is intact with no evidence of rib lesion.  IMPRESSION: No acute findings. Chronic interstitial lung disease. No rib lesions.  CHEST - 1 VIEW  COMPARISON: 10/24/2013  FINDINGS: Single lateral view of the chest reveals increased density in the bases. It appear slightly more prominent than that seen on the previous  exam.  IMPRESSION: Interstitial lung disease which appears to have increased in the interval from the prior exam in the bases bilaterally.  She has used a cough syrup with codeine that seemed to help her Assessment and Plan: Pain of right side of body - Plan: DG Ribs Unilateral Right, guaiFENesin-codeine (ROBITUSSIN AC) 100-10 MG/5ML syrup, CANCELED: DG Chest 2 View  Chronic cough - Plan: guaiFENesin-codeine (ROBITUSSIN AC) 100-10 MG/5ML syrup  Suspect she has pain in her side from coughing so much.  She has generally done well with a codeine cough syrup.  rx as above, she will follow-up if not better soon  Signed Abbe AmsterdamJessica Dayleen Beske, MD

## 2014-10-09 NOTE — Patient Instructions (Signed)
I do not see any evidence of a broken rib- I think you have a strain or sprain from coughing so much,  Try the cough syrup as needed but remember it will make you sleepy.  Let me know if you do not feel better in the next week or so- Sooner if worse.

## 2014-10-13 ENCOUNTER — Telehealth: Payer: Self-pay | Admitting: Family Medicine

## 2014-10-13 NOTE — Telephone Encounter (Signed)
Called to discuss with her.  She is feeling better, rib pain is better.  Let her know that her CXR did show worsening of her lung disease since her last CXR.  She took this under advisement and will follow-up with her pulmonologist as needed.

## 2015-04-06 ENCOUNTER — Ambulatory Visit (INDEPENDENT_AMBULATORY_CARE_PROVIDER_SITE_OTHER): Payer: Self-pay | Admitting: Family Medicine

## 2015-04-06 VITALS — BP 100/62 | HR 96 | Temp 97.9°F | Resp 18 | Ht 60.5 in | Wt 129.0 lb

## 2015-04-06 DIAGNOSIS — R059 Cough, unspecified: Secondary | ICD-10-CM

## 2015-04-06 DIAGNOSIS — R062 Wheezing: Secondary | ICD-10-CM

## 2015-04-06 DIAGNOSIS — J209 Acute bronchitis, unspecified: Secondary | ICD-10-CM

## 2015-04-06 DIAGNOSIS — J849 Interstitial pulmonary disease, unspecified: Secondary | ICD-10-CM

## 2015-04-06 DIAGNOSIS — R05 Cough: Secondary | ICD-10-CM

## 2015-04-06 MED ORDER — ALBUTEROL SULFATE 108 (90 BASE) MCG/ACT IN AEPB
2.0000 | INHALATION_SPRAY | Freq: Four times a day (QID) | RESPIRATORY_TRACT | Status: DC | PRN
Start: 2015-04-06 — End: 2016-03-13

## 2015-04-06 MED ORDER — GUAIFENESIN-CODEINE 100-10 MG/5ML PO SYRP: 5.0000 mL | ORAL_SOLUTION | Freq: Three times a day (TID) | ORAL | Status: DC | PRN

## 2015-04-06 MED ORDER — PREDNISONE 20 MG PO TABS: ORAL_TABLET | ORAL | Status: DC

## 2015-04-06 MED ORDER — DOXYCYCLINE HYCLATE 100 MG PO TABS: 100.0000 mg | ORAL_TABLET | Freq: Two times a day (BID) | ORAL | Status: DC

## 2015-04-06 NOTE — Patient Instructions (Signed)
We are going to treat your lung infection with doxycycline, albuterol for wheezing and steroids (prednisone) for wheezing and cough Please let me know if you are not feeling better in the next couple of days

## 2015-04-06 NOTE — Progress Notes (Signed)
[facility] [address], [place] [address] [phone]  Date:  04/06/2015   Name:  Julie Page   DOB:  18-Aug-1951   MRN:  [id number]  PCP:  Abbe Amsterdam, MD    Chief Complaint: Cough and Fatigue   History of Present Illness:  Julie Page is a 64 y.o. very pleasant female patient who presents with the following:  Here today with illness.  She does have a history of chronic interstitial lung disease.   She became ill about 2 weeks ago with URI/ cold type sx.   She felt tired and ill, and then noted nasal symptoms. She did have a fever and chills and was kept in bed for a few days- she may have had the flu.  She then was able to get back to work but has continued to noitce chest congestion and cough.  She is sometimes bringing up some material with cough   She still feels tired and ill but not as bad as she was She thinks that her last fever was about one week ago Prior to this illness her breathing has been about the same as usual.  She can do all of her desired activities and work her part time job without problems normally.   She last saw pulmonology about 3 years ago; has not followed up due to finances. She is still uninsured and has a hard time paying for her medical care.  She is not on any inhalers  Patient Active Problem List   Diagnosis Date Noted  . Chronic cough 04/09/2012  . Interstitial lung disease 06/13/2011    Past Medical History  Diagnosis Date  . Raynaud's syndrome   . Hypercholesteremia     Past Surgical History  Procedure Laterality Date  . Tubal ligation  03/30/1978    History  Substance Use Topics  . Smoking status: Never Smoker   . Smokeless tobacco: Never Used  . Alcohol Use: No     Comment: rare ETOH use    Family History  Problem Relation Age of Onset  . Heart disease Mother   . Heart disease Maternal Grandfather   . Heart disease Father     No Known Allergies  Medication list has been reviewed  and updated.  Current Outpatient Prescriptions on File Prior to Visit  Medication Sig Dispense Refill  . Ascorbic Acid (VITAMIN C) 1000 MG tablet Take 1,000 mg by mouth daily.      Marland Kitchen guaiFENesin-codeine (ROBITUSSIN AC) 100-10 MG/5ML syrup Take 5 mLs by mouth 3 (three) times daily as needed for cough. 120 mL 0  . Vitamin D, Ergocalciferol, (DRISDOL) 50000 UNITS CAPS Take 50,000 Units by mouth.     No current facility-administered medications on file prior to visit.    Review of Systems:  As per HPI- otherwise negative.   Physical Examination: Filed Vitals:   04/06/15 1427  BP: 100/62  Pulse: 96  Temp: 97.9 F (36.6 C)  Resp: 18   Filed Vitals:   04/06/15 1427  Height: 5' 0.5" (1.537 m)  Weight: 129 lb (58.514 kg)   Body mass index is 24.77 kg/(m^2). Ideal Body Weight: Weight in (lb) to have BMI = 25: 129.9  GEN: WDWN, NAD, Non-toxic, A & O x 3 HEENT: Atraumatic, Normocephalic. Neck supple. No masses, No LAD.  Bilateral TM wnl, oropharynx normal.  PEERL,EOMI.   Ears and Nose: No external deformity. CV: RRR, No M/G/R. No JVD. No thrill. No extra heart  sounds. PULM: bilateral wheezing and ronchi, no crackles.   No retractions. No resp. distress. No accessory muscle use. EXTR: No c/c/e NEURO Normal gait.  PSYCH: Normally interactive. Conversant. Not depressed or anxious appearing.  Calm demeanor.   BP Readings from Last 3 Encounters:  04/06/15 100/62  10/09/14 108/60  12/05/13 116/62   Assessment and Plan: Acute bronchitis, unspecified organism - Plan: doxycycline (VIBRA-TABS) 100 MG tablet  Wheezing - Plan: Albuterol Sulfate (PROAIR RESPICLICK) 108 (90 BASE) MCG/ACT AEPB, predniSONE (DELTASONE) 20 MG tablet, guaiFENesin-codeine (ROBITUSSIN AC) 100-10 MG/5ML syrup  Interstitial lung disease - Plan: Albuterol Sulfate (PROAIR RESPICLICK) 108 (90 BASE) MCG/ACT AEPB, predniSONE (DELTASONE) 20 MG tablet  Cough - Plan: guaiFENesin-codeine (ROBITUSSIN AC) 100-10 MG/5ML  syrup  Ill recently with fever and other sx- suspect she may have had the flu. Fevers are now passed but she continues to have cough, congestion and wheezing.  May have developed bronchitis.  She declines an albuterol neb due to cost.  Treat with doxycycline. Given coupon for albuterol respiclick, and short course of prednisone Robitussin AC for cough Asked her to please let me know if not feeling better soon   Signed Abbe AmsterdamJessica Ling Flesch, MD

## 2015-04-07 ENCOUNTER — Other Ambulatory Visit: Payer: Self-pay | Admitting: Physician Assistant

## 2015-05-15 ENCOUNTER — Encounter: Payer: Self-pay | Admitting: *Deleted

## 2015-07-31 ENCOUNTER — Telehealth: Payer: Self-pay | Admitting: *Deleted

## 2015-07-31 NOTE — Telephone Encounter (Signed)
Attempted to contact patient on her mobile number, [facility] with name, title, location, PCP and my direct #.

## 2015-08-03 ENCOUNTER — Telehealth: Payer: Self-pay | Admitting: *Deleted

## 2015-08-03 NOTE — Telephone Encounter (Signed)
Patient returned my call and stated she was not interested in scheduling/setting up a mammogram at this time.

## 2015-09-17 ENCOUNTER — Telehealth: Payer: Self-pay | Admitting: Family Medicine

## 2015-09-17 NOTE — Telephone Encounter (Signed)
Spoke with patient and she does not want to do a mammogram.  She states that she does not have insurance.  i offered to send her some information on a scholarship program for a free mammogram but she is against mammograms completely.  She says the risk of having one is too great and she doesn't want to take a chance?  Not sure what she is talking about, couldn't get her to go further into the details.

## 2015-09-17 NOTE — Telephone Encounter (Signed)
Ok,noted

## 2016-01-08 ENCOUNTER — Encounter: Payer: Self-pay | Admitting: Family Medicine

## 2016-01-13 ENCOUNTER — Encounter: Payer: Self-pay | Admitting: Family Medicine

## 2016-03-13 ENCOUNTER — Ambulatory Visit (INDEPENDENT_AMBULATORY_CARE_PROVIDER_SITE_OTHER): Payer: BLUE CROSS/BLUE SHIELD | Admitting: Family Medicine

## 2016-03-13 VITALS — BP 104/70 | HR 90 | Temp 97.8°F | Resp 18 | Ht 60.0 in | Wt 138.2 lb

## 2016-03-13 DIAGNOSIS — J849 Interstitial pulmonary disease, unspecified: Secondary | ICD-10-CM | POA: Diagnosis not present

## 2016-03-13 DIAGNOSIS — R062 Wheezing: Secondary | ICD-10-CM | POA: Diagnosis not present

## 2016-03-13 LAB — POCT CBC
Granulocyte percent: 58.8 %G (ref 37–80)
HCT, POC: 37.2 % — AB (ref 37.7–47.9)
Hemoglobin: 13.1 g/dL (ref 12.2–16.2)
Lymph, poc: 2 (ref 0.6–3.4)
MCH, POC: 30.7 pg (ref 27–31.2)
MCHC: 35.2 g/dL (ref 31.8–35.4)
MCV: 87.4 fL (ref 80–97)
MID (CBC): 0.6 (ref 0–0.9)
MPV: 6.8 fL (ref 0–99.8)
PLATELET COUNT, POC: 340 10*3/uL (ref 142–424)
POC Granulocyte: 3.7 (ref 2–6.9)
POC LYMPH PERCENT: 31.3 %L (ref 10–50)
POC MID %: 9.9 %M (ref 0–12)
RBC: 4.26 M/uL (ref 4.04–5.48)
RDW, POC: 12.9 %
WBC: 6.3 10*3/uL (ref 4.6–10.2)

## 2016-03-13 MED ORDER — BUDESONIDE-FORMOTEROL FUMARATE 160-4.5 MCG/ACT IN AERO: 2.0000 | INHALATION_SPRAY | Freq: Two times a day (BID) | RESPIRATORY_TRACT | Status: DC

## 2016-03-13 MED ORDER — PREDNISONE 20 MG PO TABS
ORAL_TABLET | ORAL | Status: DC
Start: 2016-03-13 — End: 2016-03-31

## 2016-03-13 MED ORDER — ALBUTEROL SULFATE (2.5 MG/3ML) 0.083% IN NEBU
2.5000 mg | INHALATION_SOLUTION | Freq: Once | RESPIRATORY_TRACT | Status: AC
  Administered 2016-03-13: 2.5 mg via RESPIRATORY_TRACT

## 2016-03-13 MED ORDER — ALBUTEROL SULFATE 108 (90 BASE) MCG/ACT IN AEPB
2.0000 | INHALATION_SPRAY | RESPIRATORY_TRACT | Status: DC | PRN
Start: 2016-03-13 — End: 2016-05-04

## 2016-03-13 MED ORDER — IPRATROPIUM BROMIDE 0.02 % IN SOLN
0.5000 mg | Freq: Once | RESPIRATORY_TRACT | Status: AC
  Administered 2016-03-13: 0.5 mg via RESPIRATORY_TRACT

## 2016-03-13 MED ORDER — NESSI SPACER WITH MASK SM/MED DEVI: Status: DC

## 2016-03-13 NOTE — Progress Notes (Addendum)
Subjective:    Patient ID: Julie Page, female    DOB: Dec 15, 1951, 65 y.o.   MRN: [id number] By signing my name below, I, Javier Docker, attest that this documentation has been prepared under the direction and in the presence of Norberto Sorenson, MD. Electronically Signed: Javier Docker, ER Scribe. 03/13/2016. 3:55 PM.  Chief Complaint  Patient presents with  . Shortness of Breath    Had flu-like sx's x 1 week  . Fatigue  . Cough  . Nasal Congestion  . Chills  . Headache    x 2 days   HPI HPI Comments: Julie Page is a 65 y.o. female with a hx of interstitial lung disease, and raynaud's syndrome who presents to [facility] complaining of SOB, fatigue, cough, HA and nasal congestion for the last eight days. Her SOB became dramatically worse today which is her primary concern - . She has an inhaler at home, but has never used it because she does not know how and she is worried the powder will irritate her throat so completely refuses to try it  - even in the office today. She denies CP. She had a fever of 102 for the first two days of her illness, but has not had a fever in the last five days. Her son had the flu before she became sick. She has not had a flu shot this yr.  She has not tried any otc meds for this.  The pt saw Dr. Delton Coombes and Tyrone Sage for her interstitial lung disease.  They recommended getting a lung biopsy as well as seeing rheumatology. She was unable to follow through for any of this due to limited finances but is hopeful that after she gets Medicare next mo she will be able to pursue this.   Pt under no condition would consider taking prednisone to help sxs due to the numerous side effect concerns.  States that recently a friend had the flu which was chased by a course of steroids and her friend (who did NOT have ILD) told her that the steroids made her worse, the sxs intolerable, and that she wished she had never taken them.  Pt reports being prescribed steroids prior but  she has never taken them.  She has never used an inhaler prior.  She has a hx of raynauds syndrome. Past Medical History  Diagnosis Date  . Raynaud's syndrome   . Hypercholesteremia    No Known Allergies  Current Outpatient Prescriptions on File Prior to Visit  Medication Sig Dispense Refill  . Ascorbic Acid (VITAMIN C) 1000 MG tablet Take 1,000 mg by mouth daily.      . Vitamin D, Ergocalciferol, (DRISDOL) 50000 UNITS CAPS Take 50,000 Units by mouth.    . Albuterol Sulfate (PROAIR RESPICLICK) 108 (90 BASE) MCG/ACT AEPB Inhale 2 puffs into the lungs 4 (four) times daily as needed. (Patient not taking: Reported on 03/13/2016) 1 each 1  . doxycycline (VIBRA-TABS) 100 MG tablet Take 1 tablet (100 mg total) by mouth 2 (two) times daily. (Patient not taking: Reported on 03/13/2016) 20 tablet 0  . guaiFENesin-codeine (ROBITUSSIN AC) 100-10 MG/5ML syrup Take 5 mLs by mouth 3 (three) times daily as needed for cough. (Patient not taking: Reported on 03/13/2016) 120 mL 0  . predniSONE (DELTASONE) 20 MG tablet Take 2 pills a day for 3 days, then 1 pill a day for 3 days (Patient not taking: Reported on 03/13/2016) 9 tablet 0   No current facility-administered medications  on file prior to visit.    Review of Systems  Constitutional: Positive for chills, activity change, appetite change and fatigue. Negative for fever.  HENT: Positive for congestion, postnasal drip and rhinorrhea. Negative for trouble swallowing.   Respiratory: Positive for cough, chest tightness, shortness of breath and wheezing. Negative for stridor.   Cardiovascular: Negative for chest pain.  Musculoskeletal: Negative for gait problem.  Neurological: Positive for headaches.      Objective:  BP 104/70 mmHg  Pulse 90  Temp(Src) 97.8 F (36.6 C) (Oral)  Resp 18  Ht 5' (1.524 m)  Wt 138 lb 4 oz (62.71 kg)  BMI 27.00 kg/m2  SpO2 94%  Physical Exam  Constitutional: She is oriented to person, place, and time. Vital signs are  normal. She appears well-developed and well-nourished. She is active. She appears ill. No distress.  HENT:  Head: Normocephalic and atraumatic.  Mouth/Throat: Oropharynx is clear and moist. No oropharyngeal exudate.  TMs, nares and oropharynx are normal.   Eyes: Pupils are equal, round, and reactive to light.  Neck: Neck supple. No thyromegaly present.  Cardiovascular: Normal rate.   Pulmonary/Chest: Accessory muscle usage present. Tachypnea noted. No respiratory distress. She has decreased breath sounds. She has no wheezes. She has rales in the right lower field and the left lower field.  Decreased breath sounds throughout and bibasilar inspiratory rales.   Musculoskeletal: Normal range of motion.  Lymphadenopathy:    She has no cervical adenopathy.  Neurological: She is alert and oriented to person, place, and time. Coordination normal.  Skin: Skin is warm and dry. She is not diaphoretic.  Psychiatric: She has a normal mood and affect. Her behavior is normal.  Nursing note and vitals reviewed.  Results for orders placed or performed in visit on 03/13/16  POCT CBC  Result Value Ref Range   WBC 6.3 4.6 - 10.2 K/uL   Lymph, poc 2.0 0.6 - 3.4   POC LYMPH PERCENT 31.3 10 - 50 %L   MID (cbc) 0.6 0 - 0.9   POC MID % 9.9 0 - 12 %M   POC Granulocyte 3.7 2 - 6.9   Granulocyte percent 58.8 37 - 80 %G   RBC 4.26 4.04 - 5.48 M/uL   Hemoglobin 13.1 12.2 - 16.2 g/dL   HCT, POC 45.4 (A) 09.8 - 47.9 %   MCV 87.4 80 - 97 fL   MCH, POC 30.7 27 - 31.2 pg   MCHC 35.2 31.8 - 35.4 g/dL   RDW, POC 11.9 %   Platelet Count, POC 340 142 - 424 K/uL   MPV 6.8 0 - 99.8 fL       Assessment & Plan:   1. Chronic interstitial lung disease (HCC)   2. Wheezing   3. Interstitial lung disease (HCC)    Pt has a very high deductible health insurance and is VERY concerned about costs of all medical care, meds, procedures, etc.  She refuses to have a home neb machine delivered due to potential cost.  She will  buy a new albuterol aerosolized inh - will not even try the pro-air respiclick in the office that she has but has never used as she had a horrible choking reaction to any powders.  Strongly advised pt to get spacer as I do not feel confident she will be able to effectively use an inhaler with her current respiratory status.  Refuses rec CXR due to cost.    Pt is adamant that she will not consider  taking systemic steroids to treat her current exacerbation of her chronic ILD.  She is concerned about their numerous side effects and poor experience others report.  Spent expensive time discussing the low liklihood or complications with a short steroid burst - esp considering the high risk of morbidity and mortality with untreated exacerbation of her ILD. After extensive discussion with pt, she agrees to take rx for prednisone home with her and she might consider trying 1 tab if her sxs cont to worsen o/n - counseled to go to ER immed if sxs worsen at all - I expressed my concern several times that she could go into resp failure, cardiac arrest, or even mortality if she continues to have untreated increased work of breathing for a lengthy period.  Suspect viral but if fever, prod cough, cong recur/worsen, call clinic to cons covering with an abx. Start symbicort though will take sev wks to be fully effective so may be to late to help current sxs but better than no trx.  Advised starting symbicort immed upon developing URI/worsening resp sxs in future. She refuses to use it more regularly.  Orders Placed This Encounter  Procedures  . For home use only DME Nebulizer machine  . POCT CBC    Meds ordered this encounter  Medications  . albuterol (PROVENTIL) (2.5 MG/3ML) 0.083% nebulizer solution 2.5 mg    Sig:   . ipratropium (ATROVENT) nebulizer solution 0.5 mg    Sig:   . Albuterol Sulfate (PROAIR RESPICLICK) 108 (90 Base) MCG/ACT AEPB    Sig: Inhale 2 puffs into the lungs every 4 (four) hours as needed.     Dispense:  1 each    Refill:  2    Ok to use any type of albuterol that is lease expensive to patient.  Please demonstrate its use to pt.  Marland Kitchen. Spacer/Aero-Holding Chambers (NESSI SPACER WITH MASK SM/MED) DEVI    Sig: Use as directed with albuterol inhaler    Dispense:  1 Device    Refill:  0    Please dispense any type/brand of spacer that is most cost-effective and works with the albuterol pt is being dispensed.  Please make sure that use is demonstrated to pt.  . predniSONE (DELTASONE) 20 MG tablet    Sig: Take 2 pills a day for 3 days, then 1 pill a day for 3 days    Dispense:  9 tablet    Refill:  0  . budesonide-formoterol (SYMBICORT) 160-4.5 MCG/ACT inhaler    Sig: Inhale 2 puffs into the lungs 2 (two) times daily.    Dispense:  1 Inhaler    Refill:  11    I personally performed the services described in this documentation, which was scribed in my presence. The recorded information has been reviewed and considered, and addended by me as needed.  Norberto SorensonEva Akeya Ryther, MD MPH

## 2016-03-13 NOTE — Patient Instructions (Addendum)
   IF you received an x-ray today, you will receive an invoice from [facility]. Please contact [facility] at [phone] with questions or concerns regarding your invoice.   IF you received labwork today, you will receive an invoice from [facility]. Please contact [facility] at [phone] with questions or concerns regarding your invoice.   Our billing staff will not be able to assist you with questions regarding bills from these companies.  You will be contacted with the lab results as soon as they are available. The fastest way to get your results is to activate your My Chart account. Instructions are located on the last page of this paperwork. If you have not heard from us regarding the results in 2 weeks, please contact this office.     Influenza, Adult Influenza ("the flu") is a viral infection of the respiratory tract. It occurs more often in winter months because people spend more time in close contact with one another. Influenza can make you feel very sick. Influenza easily spreads from person to person (contagious). CAUSES  Influenza is caused by a virus that infects the respiratory tract. You can catch the virus by breathing in droplets from an infected person's cough or sneeze. You can also catch the virus by touching something that was recently contaminated with the virus and then touching your mouth, nose, or eyes. RISKS AND COMPLICATIONS You may be at risk for a more severe case of influenza if you smoke cigarettes, have diabetes, have chronic heart disease (such as heart failure) or lung disease (such as asthma), or if you have a weakened immune system. Elderly people and pregnant women are also at risk for more serious infections. The most common problem of influenza is a lung infection (pneumonia). Sometimes, this problem can require emergency medical care and may be life threatening. SIGNS AND SYMPTOMS  Symptoms typically last 4  to 10 days and may include:  Fever.  Chills.  Headache, body aches, and muscle aches.  Sore throat.  Chest discomfort and cough.  Poor appetite.  Weakness or feeling tired.  Dizziness.  Nausea or vomiting. DIAGNOSIS  Diagnosis of influenza is often made based on your history and a physical exam. A nose or throat swab test can be done to confirm the diagnosis. TREATMENT  In mild cases, influenza goes away on its own. Treatment is directed at relieving symptoms. For more severe cases, your health care provider may prescribe antiviral medicines to shorten the sickness. Antibiotic medicines are not effective because the infection is caused by a virus, not by bacteria. HOME CARE INSTRUCTIONS  Take medicines only as directed by your health care provider.  Use a cool mist humidifier to make breathing easier.  Get plenty of rest until your temperature returns to normal. This usually takes 3 to 4 days.  Drink enough fluid to keep your urine clear or pale yellow.  Cover yourmouth and nosewhen coughing or sneezing,and wash your handswellto prevent thevirusfrom spreading.  Stay homefromwork orschool untilthe fever is gonefor at least 1full day. PREVENTION  An annual influenza vaccination (flu shot) is the best way to avoid getting influenza. An annual flu shot is now routinely recommended for all adults in the [place] SEEK MEDICAL CARE IF:  You experiencechest pain, yourcough worsens,or you producemore mucus.  Youhave nausea,vomiting, ordiarrhea.  Your fever returns or gets worse. SEEK IMMEDIATE MEDICAL CARE IF:  You havetrouble breathing, you become short of breath,or your skin ornails becomebluish.  You have severe painor   stiffnessin the neck.  You develop a sudden headache, or pain in the face or ear.  You have nausea or vomiting that you cannot control. MAKE SURE YOU:   Understand these instructions.  Will watch your condition.  Will get help  right away if you are not doing well or get worse.   This information is not intended to replace advice given to you by your health care provider. Make sure you discuss any questions you have with your health care provider.   Document Released: 12/02/2000 Document Revised: 12/26/2014 Document Reviewed: 03/05/2012 Elsevier Interactive Patient Education 2016 Elsevier Inc.  

## 2016-03-23 ENCOUNTER — Ambulatory Visit (HOSPITAL_BASED_OUTPATIENT_CLINIC_OR_DEPARTMENT_OTHER)
Admission: RE | Admit: 2016-03-23 | Discharge: 2016-03-23 | Disposition: A | Discharge status: Home / Self Care | Payer: Medicare Other | Source: Ambulatory Visit | Attending: Family Medicine | Admitting: Family Medicine

## 2016-03-23 ENCOUNTER — Ambulatory Visit (INDEPENDENT_AMBULATORY_CARE_PROVIDER_SITE_OTHER): Payer: Medicare Other | Admitting: Family Medicine

## 2016-03-23 ENCOUNTER — Encounter: Payer: Self-pay | Admitting: Family Medicine

## 2016-03-23 VITALS — BP 130/72 | HR 97 | Temp 99.0°F | Ht 60.5 in | Wt 137.6 lb

## 2016-03-23 DIAGNOSIS — R0602 Shortness of breath: Secondary | ICD-10-CM | POA: Diagnosis not present

## 2016-03-23 DIAGNOSIS — J849 Interstitial pulmonary disease, unspecified: Secondary | ICD-10-CM | POA: Diagnosis not present

## 2016-03-23 DIAGNOSIS — R509 Fever, unspecified: Secondary | ICD-10-CM | POA: Diagnosis not present

## 2016-03-23 DIAGNOSIS — R062 Wheezing: Secondary | ICD-10-CM

## 2016-03-23 DIAGNOSIS — J189 Pneumonia, unspecified organism: Secondary | ICD-10-CM | POA: Diagnosis not present

## 2016-03-23 DIAGNOSIS — J9601 Acute respiratory failure with hypoxia: Secondary | ICD-10-CM | POA: Diagnosis not present

## 2016-03-23 DIAGNOSIS — R0902 Hypoxemia: Secondary | ICD-10-CM | POA: Diagnosis not present

## 2016-03-23 DIAGNOSIS — E440 Moderate protein-calorie malnutrition: Secondary | ICD-10-CM | POA: Diagnosis not present

## 2016-03-23 DIAGNOSIS — I5032 Chronic diastolic (congestive) heart failure: Secondary | ICD-10-CM | POA: Diagnosis not present

## 2016-03-23 DIAGNOSIS — I272 Other secondary pulmonary hypertension: Secondary | ICD-10-CM | POA: Diagnosis not present

## 2016-03-23 DIAGNOSIS — R6510 Systemic inflammatory response syndrome (SIRS) of non-infectious origin without acute organ dysfunction: Secondary | ICD-10-CM | POA: Diagnosis not present

## 2016-03-23 LAB — POCT INFLUENZA A/B
INFLUENZA A, POC: NEGATIVE
INFLUENZA B, POC: NEGATIVE

## 2016-03-23 MED ORDER — ALBUTEROL SULFATE (2.5 MG/3ML) 0.083% IN NEBU: 2.5000 mg | INHALATION_SOLUTION | Freq: Once | RESPIRATORY_TRACT | Status: DC

## 2016-03-23 MED ORDER — LEVOFLOXACIN 500 MG PO TABS: 500.0000 mg | ORAL_TABLET | Freq: Every day | ORAL | Status: DC

## 2016-03-23 MED ORDER — CEFTRIAXONE SODIUM 1 G IJ SOLR
1.0000 g | Freq: Once | INTRAMUSCULAR | Status: AC
  Administered 2016-03-23: 1 g via INTRAMUSCULAR

## 2016-03-23 MED ORDER — IPRATROPIUM-ALBUTEROL 0.5-2.5 (3) MG/3ML IN SOLN: 3.0000 mL | RESPIRATORY_TRACT | Status: DC | PRN

## 2016-03-23 MED ORDER — IPRATROPIUM BROMIDE 0.02 % IN SOLN
0.5000 mg | Freq: Once | RESPIRATORY_TRACT | Status: AC
  Administered 2016-03-23: 0.5 mg via RESPIRATORY_TRACT

## 2016-03-23 NOTE — Progress Notes (Signed)
[facility] [address], [address] [place], [place] [id number] [phone] [phone]  Date:  03/23/2016   Name:  Julie Page   DOB:  12/05/1951   MRN:  [id number]  PCP:  Abbe AmsterdamOPLAND,JESSICA, MD    Chief Complaint: Fever   History of Present Illness:  Julie PorchCarolyn L Page is a 65 y.o. very pleasant female patient who presents with the following:  Today is Wednesday Generally healthy pt except for history of ILD.  She has seen Dr. Delton CoombesByrum in the past who recommended a lung bx to confirm her dx but so far this has not been done.   Here today with illness- she was sick 2 weeks ago, got better but then sickened again over the last 3 days.   Her son had the flu not long ago.  They wonder if she also caught the flu  [facility] complaint of worsening SOB- she was given albuterol inhaler, prednisone and symbicort but has only filled the albuterol.  She did get a little better, but then this past Sunday night she had a fever, yesterday she was worse again.  She is coughing a lot, feels weak and SOB Tmax 101.4 this go round.   (102 2 weeks ago) Her main problem is cough and wheezing. She does not have any CP except with coughing No history of CHF or other cardiac concerns   No ST She is able to eat some but does not have much appetite Drinking liquids ok  SpO2 Readings from Last 3 Encounters:  03/23/16 91%  03/13/16 94%  04/06/15 95%     Patient Active Problem List   Diagnosis Date Noted  . Chronic cough 04/09/2012  . Interstitial lung disease (HCC) 06/13/2011    Past Medical History  Diagnosis Date  . Raynaud's syndrome   . Hypercholesteremia     Past Surgical History  Procedure Laterality Date  . Tubal ligation  03/30/1978    Social History  Substance Use Topics  . Smoking status: Never Smoker   . Smokeless tobacco: Never Used  . Alcohol Use: No     Comment: rare ETOH use    Family History  Problem  Relation Age of Onset  . Heart disease Mother   . Heart disease Maternal Grandfather   . Heart disease Father     No Known Allergies  Medication list has been reviewed and updated.  Current Outpatient Prescriptions on File Prior to Visit  Medication Sig Dispense Refill  . predniSONE (DELTASONE) 20 MG tablet Take 2 pills a day for 3 days, then 1 pill a day for 3 days 9 tablet 0  . Albuterol Sulfate (PROAIR RESPICLICK) 108 (90 Base) MCG/ACT AEPB Inhale 2 puffs into the lungs every 4 (four) hours as needed. (Patient not taking: Reported on 03/23/2016) 1 each 2  . budesonide-formoterol (SYMBICORT) 160-4.5 MCG/ACT inhaler Inhale 2 puffs into the lungs 2 (two) times daily. (Patient not taking: Reported on 03/23/2016) 1 Inhaler 11  . Spacer/Aero-Holding Chambers (NESSI SPACER WITH MASK SM/MED) DEVI Use as directed with albuterol inhaler (Patient not taking: Reported on 03/23/2016) 1 Device 0  . Vitamin D, Ergocalciferol, (DRISDOL) 50000 UNITS CAPS Take 50,000 Units by mouth. Reported on 03/23/2016     No current facility-administered medications on file prior to visit.    Review of Systems:  As per HPI- otherwise negative.   Physical Examination: Filed Vitals:   03/23/16  1429  BP: 130/72  Pulse: 97  Temp: 99 F (37.2 C)   Filed Vitals:   03/23/16 1429  Height: 5' 0.5" (1.537 m)  Weight: 137 lb 9.6 oz (62.415 kg)   Body mass index is 26.42 kg/(m^2). Ideal Body Weight: Weight in (lb) to have BMI = 25: 129.9  GEN: WDWN, NAD, Non-toxic, A & O x 3, appears ill and not her normal self HEENT: Atraumatic, Normocephalic. Neck supple. No masses, No LAD. Ears and Nose: No external deformity. CV: RRR, No M/G/R. No JVD. No thrill. No extra heart sounds. PULM: diffuse wheezes and ronchi in both bases.   EXTR: No c/c/e NEURO Normal gait.  PSYCH: Normally interactive. Conversant. Not depressed or anxious appearing.  Calm demeanor.   Albuterol and atrovent neb- improved her air exchange somewhat  and sat to 93-94% After x-ray had her walk and sats remained at 95- 96%  Dg Chest 2 View  03/23/2016  CLINICAL DATA:  Wheezing, fever EXAM: CHEST  2 VIEW COMPARISON:  10/09/2014 FINDINGS: Diffuse interstitial prominence throughout the lungs, worsened since prior study. Heart is normal size. No effusions. No acute bony abnormality. IMPRESSION: Severe diffuse interstitial prominence throughout the lungs, worsening since prior study. This likely reflects underlying chronic interstitial lung disease. Cannot exclude superimposed interstitial pneumonia. Electronically Signed   By: Charlett Nose M.D.   On: 03/23/2016 15:24    Assessment and Plan: CAP (community acquired pneumonia) - Plan: cefTRIAXone (ROCEPHIN) injection 1 g, ipratropium-albuterol (DUONEB) 0.5-2.5 (3) MG/3ML SOLN, levofloxacin (LEVAQUIN) 500 MG tablet  Wheezing - Plan: DG Chest 2 View, albuterol (PROVENTIL) (2.5 MG/3ML) 0.083% nebulizer solution 2.5 mg, ipratropium (ATROVENT) nebulizer solution 0.5 mg, ipratropium-albuterol (DUONEB) 0.5-2.5 (3) MG/3ML SOLN  Fever, unspecified - Plan: DG Chest 2 View, POCT Influenza A/B  Here today with CAP- she also has interstitial lung disease which is usually more or less asymptomatic when she is well. She felt a lot better after duoneb today. They will be sure to get a neb machine for home Offered to directly admit pt vs treating her at home with close follow-up and the understanding that she would go to the ER if worse. Pt and her son prefer to treat her at home and will come in tomorrow for a recheck Rocephin IM duoneb rx levaquin rx appt made for tomorrow  Signed Abbe Amsterdam, MD

## 2016-03-23 NOTE — Patient Instructions (Signed)
You have pneumonia Get a nebulizer machine and use it nebulizer treatment every 4 hours as needed for cough and shortness of breath You got a shot of antibiotics today- you can start on the levaquin oral antibiotic tomorrow.    Please come and see me tomorrow at 1pm If you are not doing ok tonight or start getting worse go to the ER!

## 2016-03-24 ENCOUNTER — Encounter: Payer: Self-pay | Admitting: Family Medicine

## 2016-03-24 ENCOUNTER — Other Ambulatory Visit: Payer: Self-pay

## 2016-03-24 ENCOUNTER — Encounter (HOSPITAL_COMMUNITY): Payer: Self-pay | Admitting: Emergency Medicine

## 2016-03-24 ENCOUNTER — Inpatient Hospital Stay (HOSPITAL_COMMUNITY)
Admission: EM | Admit: 2016-03-24 | Discharge: 2016-03-31 | DRG: 871 | Disposition: A | Discharge status: Skilled Nursing Facility | Payer: Medicare Other | Attending: Internal Medicine | Admitting: Internal Medicine

## 2016-03-24 ENCOUNTER — Ambulatory Visit (INDEPENDENT_AMBULATORY_CARE_PROVIDER_SITE_OTHER): Payer: Medicare Other | Admitting: Family Medicine

## 2016-03-24 VITALS — BP 120/62 | HR 118 | Temp 97.9°F | Resp 24 | Ht 60.0 in | Wt 135.0 lb

## 2016-03-24 DIAGNOSIS — J9621 Acute and chronic respiratory failure with hypoxia: Secondary | ICD-10-CM | POA: Diagnosis not present

## 2016-03-24 DIAGNOSIS — I272 Other secondary pulmonary hypertension: Secondary | ICD-10-CM | POA: Diagnosis present

## 2016-03-24 DIAGNOSIS — D638 Anemia in other chronic diseases classified elsewhere: Secondary | ICD-10-CM | POA: Diagnosis present

## 2016-03-24 DIAGNOSIS — R0902 Hypoxemia: Secondary | ICD-10-CM

## 2016-03-24 DIAGNOSIS — G4700 Insomnia, unspecified: Secondary | ICD-10-CM | POA: Diagnosis present

## 2016-03-24 DIAGNOSIS — Z9119 Patient's noncompliance with other medical treatment and regimen: Secondary | ICD-10-CM | POA: Diagnosis not present

## 2016-03-24 DIAGNOSIS — M6281 Muscle weakness (generalized): Secondary | ICD-10-CM | POA: Diagnosis not present

## 2016-03-24 DIAGNOSIS — R262 Difficulty in walking, not elsewhere classified: Secondary | ICD-10-CM | POA: Diagnosis not present

## 2016-03-24 DIAGNOSIS — R0602 Shortness of breath: Secondary | ICD-10-CM | POA: Diagnosis not present

## 2016-03-24 DIAGNOSIS — E785 Hyperlipidemia, unspecified: Secondary | ICD-10-CM | POA: Diagnosis present

## 2016-03-24 DIAGNOSIS — J849 Interstitial pulmonary disease, unspecified: Secondary | ICD-10-CM | POA: Diagnosis not present

## 2016-03-24 DIAGNOSIS — Z79899 Other long term (current) drug therapy: Secondary | ICD-10-CM | POA: Diagnosis not present

## 2016-03-24 DIAGNOSIS — E7800 Pure hypercholesterolemia, unspecified: Secondary | ICD-10-CM | POA: Diagnosis present

## 2016-03-24 DIAGNOSIS — R000 Tachycardia, unspecified: Secondary | ICD-10-CM | POA: Diagnosis present

## 2016-03-24 DIAGNOSIS — Z8249 Family history of ischemic heart disease and other diseases of the circulatory system: Secondary | ICD-10-CM | POA: Diagnosis not present

## 2016-03-24 DIAGNOSIS — J189 Pneumonia, unspecified organism: Secondary | ICD-10-CM

## 2016-03-24 DIAGNOSIS — E440 Moderate protein-calorie malnutrition: Secondary | ICD-10-CM | POA: Diagnosis present

## 2016-03-24 DIAGNOSIS — M3500 Sicca syndrome, unspecified: Secondary | ICD-10-CM | POA: Diagnosis present

## 2016-03-24 DIAGNOSIS — D649 Anemia, unspecified: Secondary | ICD-10-CM | POA: Diagnosis present

## 2016-03-24 DIAGNOSIS — A419 Sepsis, unspecified organism: Secondary | ICD-10-CM | POA: Diagnosis not present

## 2016-03-24 DIAGNOSIS — I7300 Raynaud's syndrome without gangrene: Secondary | ICD-10-CM | POA: Diagnosis present

## 2016-03-24 DIAGNOSIS — Z7952 Long term (current) use of systemic steroids: Secondary | ICD-10-CM

## 2016-03-24 DIAGNOSIS — Z5189 Encounter for other specified aftercare: Secondary | ICD-10-CM | POA: Diagnosis not present

## 2016-03-24 DIAGNOSIS — R6510 Systemic inflammatory response syndrome (SIRS) of non-infectious origin without acute organ dysfunction: Secondary | ICD-10-CM | POA: Diagnosis not present

## 2016-03-24 DIAGNOSIS — R030 Elevated blood-pressure reading, without diagnosis of hypertension: Secondary | ICD-10-CM | POA: Diagnosis not present

## 2016-03-24 DIAGNOSIS — J9601 Acute respiratory failure with hypoxia: Secondary | ICD-10-CM | POA: Diagnosis not present

## 2016-03-24 DIAGNOSIS — J80 Acute respiratory distress syndrome: Secondary | ICD-10-CM | POA: Diagnosis not present

## 2016-03-24 DIAGNOSIS — I5032 Chronic diastolic (congestive) heart failure: Secondary | ICD-10-CM | POA: Diagnosis present

## 2016-03-24 DIAGNOSIS — J989 Respiratory disorder, unspecified: Secondary | ICD-10-CM | POA: Diagnosis present

## 2016-03-24 DIAGNOSIS — Z7982 Long term (current) use of aspirin: Secondary | ICD-10-CM

## 2016-03-24 DIAGNOSIS — Z8619 Personal history of other infectious and parasitic diseases: Secondary | ICD-10-CM

## 2016-03-24 DIAGNOSIS — J90 Pleural effusion, not elsewhere classified: Secondary | ICD-10-CM | POA: Diagnosis not present

## 2016-03-24 LAB — BASIC METABOLIC PANEL
Anion gap: 13 (ref 5–15)
BUN: 11 mg/dL (ref 6–20)
CO2: 22 mmol/L (ref 22–32)
Calcium: 8.7 mg/dL — ABNORMAL LOW (ref 8.9–10.3)
Chloride: 104 mmol/L (ref 101–111)
Creatinine, Ser: 0.53 mg/dL (ref 0.44–1.00)
GFR calc Af Amer: >60 mL/min (ref >60)
GFR calc non Af Amer: >60 mL/min (ref >60)
Glucose, Bld: 99 mg/dL (ref 65–99)
Potassium: 3.6 mmol/L (ref 3.5–5.1)
Sodium: 139 mmol/L (ref 135–145)

## 2016-03-24 LAB — URINALYSIS, ROUTINE W REFLEX MICROSCOPIC
Bilirubin Urine: NEGATIVE
GLUCOSE, UA: NEGATIVE mg/dL
Hgb urine dipstick: NEGATIVE
Ketones, ur: 40 mg/dL — AB
Nitrite: NEGATIVE
PROTEIN: NEGATIVE mg/dL
SPECIFIC GRAVITY, URINE: 1.013 (ref 1.005–1.030)
pH: 6 (ref 5.0–8.0)

## 2016-03-24 LAB — CBC WITH DIFFERENTIAL/PLATELET
Basophils Absolute: 0 10*3/uL (ref 0.0–0.1)
Basophils Relative: 0 %
Eosinophils Absolute: 0.1 10*3/uL (ref 0.0–0.7)
Eosinophils Relative: 1 %
HCT: 31.7 % — ABNORMAL LOW (ref 36.0–46.0)
Hemoglobin: 10.7 g/dL — ABNORMAL LOW (ref 12.0–15.0)
Lymphocytes Relative: 17 %
Lymphs Abs: 1.9 10*3/uL (ref 0.7–4.0)
MCH: 29.4 pg (ref 26.0–34.0)
MCHC: 33.8 g/dL (ref 30.0–36.0)
MCV: 87.1 fL (ref 78.0–100.0)
Monocytes Absolute: 0.8 10*3/uL (ref 0.1–1.0)
Monocytes Relative: 7 %
Neutro Abs: 8.2 10*3/uL — ABNORMAL HIGH (ref 1.7–7.7)
Neutrophils Relative %: 75 %
Platelets: 366 10*3/uL (ref 150–400)
RBC: 3.64 MIL/uL — ABNORMAL LOW (ref 3.87–5.11)
RDW: 13.4 % (ref 11.5–15.5)
WBC: 11 10*3/uL — ABNORMAL HIGH (ref 4.0–10.5)

## 2016-03-24 LAB — URINE MICROSCOPIC-ADD ON

## 2016-03-24 LAB — I-STAT CG4 LACTIC ACID, ED: LACTIC ACID, VENOUS: 1.81 mmol/L (ref 0.5–2.0)

## 2016-03-24 MED ORDER — DEXTROSE 5 % IV SOLN
500.0000 mg | INTRAVENOUS | Status: DC
  Filled 2016-03-24: qty 500

## 2016-03-24 MED ORDER — DEXTROSE 5 % IV SOLN
1.0000 g | Freq: Once | INTRAVENOUS | Status: AC
  Administered 2016-03-24: 1 g via INTRAVENOUS
  Filled 2016-03-24: qty 10

## 2016-03-24 MED ORDER — DEXTROSE 5 % IV SOLN
1.0000 g | INTRAVENOUS | Status: AC
  Administered 2016-03-25 – 2016-03-30 (×6): 1 g via INTRAVENOUS
  Filled 2016-03-24 (×6): qty 10

## 2016-03-24 MED ORDER — ACETAMINOPHEN 325 MG PO TABS
650.0000 mg | ORAL_TABLET | Freq: Four times a day (QID) | ORAL | Status: DC | PRN
  Administered 2016-03-28 – 2016-03-30 (×6): 650 mg via ORAL
  Filled 2016-03-24 (×6): qty 2

## 2016-03-24 MED ORDER — ARFORMOTEROL TARTRATE 15 MCG/2ML IN NEBU
15.0000 ug | INHALATION_SOLUTION | Freq: Two times a day (BID) | RESPIRATORY_TRACT | Status: DC
  Administered 2016-03-25 (×2): 15 ug via RESPIRATORY_TRACT
  Filled 2016-03-24 (×11): qty 2

## 2016-03-24 MED ORDER — BENZONATATE 100 MG PO CAPS
200.0000 mg | ORAL_CAPSULE | Freq: Two times a day (BID) | ORAL | Status: DC | PRN
  Administered 2016-03-25 (×2): 200 mg via ORAL
  Filled 2016-03-24 (×2): qty 2

## 2016-03-24 MED ORDER — DEXTROSE 5 % IV SOLN
500.0000 mg | Freq: Once | INTRAVENOUS | Status: AC
  Administered 2016-03-24: 500 mg via INTRAVENOUS
  Filled 2016-03-24: qty 500

## 2016-03-24 MED ORDER — FLUTICASONE FUROATE-VILANTEROL 200-25 MCG/INH IN AEPB
1.0000 | INHALATION_SPRAY | Freq: Every day | RESPIRATORY_TRACT | Status: DC
  Administered 2016-03-24: 1 via RESPIRATORY_TRACT
  Filled 2016-03-24: qty 28

## 2016-03-24 MED ORDER — SODIUM CHLORIDE 0.9 % IV SOLN
INTRAVENOUS | Status: DC
Start: 2016-03-24 — End: 2016-03-28
  Administered 2016-03-24 – 2016-03-27 (×7): via INTRAVENOUS

## 2016-03-24 MED ORDER — BUDESONIDE 0.5 MG/2ML IN SUSP
0.5000 mg | Freq: Two times a day (BID) | RESPIRATORY_TRACT | Status: DC
  Administered 2016-03-25 – 2016-03-31 (×13): 0.5 mg via RESPIRATORY_TRACT
  Filled 2016-03-24 (×13): qty 2

## 2016-03-24 MED ORDER — ENOXAPARIN SODIUM 40 MG/0.4ML ~~LOC~~ SOLN
40.0000 mg | SUBCUTANEOUS | Status: DC
  Administered 2016-03-24 – 2016-03-30 (×7): 40 mg via SUBCUTANEOUS
  Filled 2016-03-24 (×7): qty 0.4

## 2016-03-24 MED ORDER — SODIUM CHLORIDE 0.9 % IV BOLUS (SEPSIS)
1000.0000 mL | Freq: Once | INTRAVENOUS | Status: AC
  Administered 2016-03-24: 1000 mL via INTRAVENOUS

## 2016-03-24 MED ORDER — PREDNISONE 50 MG PO TABS
60.0000 mg | ORAL_TABLET | Freq: Every day | ORAL | Status: DC
  Administered 2016-03-25 – 2016-03-26 (×2): 60 mg via ORAL
  Filled 2016-03-24 (×2): qty 1

## 2016-03-24 MED ORDER — GUAIFENESIN ER 600 MG PO TB12
600.0000 mg | ORAL_TABLET | Freq: Two times a day (BID) | ORAL | Status: DC
  Administered 2016-03-24 – 2016-03-31 (×14): 600 mg via ORAL
  Filled 2016-03-24 (×15): qty 1

## 2016-03-24 MED ORDER — IPRATROPIUM-ALBUTEROL 0.5-2.5 (3) MG/3ML IN SOLN
3.0000 mL | RESPIRATORY_TRACT | Status: DC | PRN
  Administered 2016-03-28: 3 mL via RESPIRATORY_TRACT
  Filled 2016-03-24: qty 3

## 2016-03-24 MED ORDER — ASPIRIN EC 325 MG PO TBEC
325.0000 mg | DELAYED_RELEASE_TABLET | Freq: Four times a day (QID) | ORAL | Status: DC | PRN
  Filled 2016-03-24: qty 1

## 2016-03-24 NOTE — Progress Notes (Signed)
Pre visit review using our clinic review tool, if applicable. No additional management support is needed unless otherwise documented below in the visit note. 

## 2016-03-24 NOTE — ED Notes (Signed)
19:20 to floor. 

## 2016-03-24 NOTE — Progress Notes (Signed)
[facility] [address], [address] [place], [place] [address] [phone] [phone]  Date:  03/24/2016   Name:  Julie PorchCarolyn L Hassey   DOB:  06/13/1951   MRN:  [id number]  PCP:  Abbe AmsterdamOPLAND,JESSICA, MD    Chief Complaint: Follow-up   History of Present Illness:  Julie Page is a 65 y.o. very pleasant female patient who presents with the following:  History of ILD.  Seen by myself yesterday with pneumonia- we gave a gram of rocephin, started levaquin and duoneb treatments.   She is here today for a recheck- feeling "about the same."  Her son thinks that she may be worse. She is working hard to breathe She did sleep some last night  Temp up to about 99 last night. She did take aspirin prior to coming here today  Last used her duoneb about 2 hours ago  Patient Active Problem List   Diagnosis Date Noted  . Chronic cough 04/09/2012  . Interstitial lung disease (HCC) 06/13/2011    Past Medical History  Diagnosis Date  . Raynaud's syndrome   . Hypercholesteremia     Past Surgical History  Procedure Laterality Date  . Tubal ligation  03/30/1978    Social History  Substance Use Topics  . Smoking status: Never Smoker   . Smokeless tobacco: Never Used  . Alcohol Use: No     Comment: rare ETOH use    Family History  Problem Relation Age of Onset  . Heart disease Mother   . Heart disease Maternal Grandfather   . Heart disease Father     No Known Allergies  Medication list has been reviewed and updated.  Current Outpatient Prescriptions on File Prior to Visit  Medication Sig Dispense Refill  . Albuterol Sulfate (PROAIR RESPICLICK) 108 (90 Base) MCG/ACT AEPB Inhale 2 puffs into the lungs every 4 (four) hours as needed. (Patient not taking: Reported on 03/23/2016) 1 each 2  . budesonide-formoterol (SYMBICORT) 160-4.5 MCG/ACT inhaler Inhale 2 puffs into the lungs 2 (two) times daily. (Patient not taking: Reported on 03/23/2016) 1  Inhaler 11  . ipratropium-albuterol (DUONEB) 0.5-2.5 (3) MG/3ML SOLN Take 3 mLs by nebulization every 4 (four) hours as needed. 360 mL 1  . levofloxacin (LEVAQUIN) 500 MG tablet Take 1 tablet (500 mg total) by mouth daily. 10 tablet 0  . predniSONE (DELTASONE) 20 MG tablet Take 2 pills a day for 3 days, then 1 pill a day for 3 days 9 tablet 0  . Spacer/Aero-Holding Chambers (NESSI SPACER WITH MASK SM/MED) DEVI Use as directed with albuterol inhaler (Patient not taking: Reported on 03/23/2016) 1 Device 0  . Vitamin D, Ergocalciferol, (DRISDOL) 50000 UNITS CAPS Take 50,000 Units by mouth. Reported on 03/23/2016     Current Facility-Administered Medications on File Prior to Visit  Medication Dose Route Frequency Provider Last Rate Last Dose  . albuterol (PROVENTIL) (2.5 MG/3ML) 0.083% nebulizer solution 2.5 mg  2.5 mg Nebulization Once Gwenlyn FoundJessica C Copland, MD        Review of Systems:  As per HPI- otherwise negative.   Physical Examination:  GEN: WDWN, NAD, Non-toxic, A & O x 3, does not appear her normal self- appears ill HEENT: Atraumatic, Normocephalic. Neck supple. No masses, No LAD. Ears and Nose: No external deformity. CV: RRR, No M/G/R. No JVD. No thrill. No extra heart sounds. PULM: tachypnea., wheezing bilaterally and ronchi in bilateral bases . ABD: S, NT, ND, +BS. No rebound.  No HSM. EXTR: No c/c/e NEURO Normal gait.  PSYCH: Normally interactive. Conversant. Not depressed or anxious appearing.  Calm demeanor.    Assessment and Plan: CAP (community acquired pneumonia)  Interstitial lung disease (HCC)  Here today for a recheck. She is not any better and is getting tired.  Was not able to get her directly admitted to the hospital.  She and her son will go to the ER at [facility] for evaluation and possibly admission  Signed Abbe Amsterdam, MD

## 2016-03-24 NOTE — Patient Instructions (Signed)
Please go to the ER for further evaluation. I will call [facility] and let them know that you are on your way

## 2016-03-24 NOTE — ED Notes (Signed)
Called 4e - bed side report otw

## 2016-03-24 NOTE — H&P (Addendum)
[facility] History and Physical  Julie Page WUJ:[id number] DOB: 08/07/1951 DOA: 03/24/2016  Referring physician: ED PCP: Abbe Amsterdam, MD   Chief Complaint: Cough and shortness of breath  HPI:  Julie Page  is a 65 year old female with a past medical history significant for Raynaud's, HLD, and  autoimmune lung scarring; who presents with complaints of cough and shortness of breath. She reports having flulike symptoms 2 weeks ago fevers up to 102.1F after her son had the flu prior. When she initially went to go get evaluated she was out of the window to receive Tamiflu and therefore was not tested. She reports initially getting better, but subsequently over the last weekend progressively declined reporting recurrent cough and fever. She reports fevers up to 101.69F. She saw her primary care provider yesterday and was diagnosed with pneumonia and was given a gram of Rocephin in the office, then prescriptions for DuoNeb, and Levaquin. She took one dose in the office and then subsequently a dose this morning, however symptoms did not improve. When she follow back up with her primary care provider she was worse today and found to be tachycardic with reported low O2 saturations. Her primary care provider sent her to the emergency department for further evaluation.  Upon admission patient is evaluated and seen to have heart rate of 114, respirations 22, O2 saturations 90%. Initial lab work revealed a WBC of 11, hemoglobin at 10.7, lactic acid 1.81. Chest x-ray showing diffuse interstitial prominence throughout both lungs reflecting chronic interstitial lung disease although cannot exclude a superimposed interstitial pneumonia. She was placed on Rocephin and azithromycin IV in the ED. [facility] consulted to admit   Review of Systems  Constitutional: Positive for fever, chills and malaise/fatigue.  HENT: Negative for hearing loss.   Eyes: Negative for photophobia and pain.   Respiratory: Positive for cough, sputum production, shortness of breath and wheezing.   Cardiovascular: Negative for chest pain, palpitations and orthopnea.  Gastrointestinal: Negative for nausea, vomiting and abdominal pain.  Genitourinary: Negative for dysuria and urgency.  Musculoskeletal: Negative for back pain and neck pain.  Skin: Negative for itching and rash.  Neurological: Positive for weakness. Negative for tingling, tremors, sensory change and headaches.  Endo/Heme/Allergies: Negative for environmental allergies. Does not bruise/bleed easily.  Psychiatric/Behavioral: Negative for hallucinations and substance abuse.      Past Medical History  Diagnosis Date  . Raynaud's syndrome   . Hypercholesteremia      Past Surgical History  Procedure Laterality Date  . Tubal ligation  03/30/1978      Social History:  reports that she has never smoked. She has never used smokeless tobacco. She reports that she does not drink alcohol or use illicit drugs. Where does patient live--home and with whom if at home? Can patient participate in ADLs? yes  No Known Allergies  Family History  Problem Relation Age of Onset  . Heart disease Mother   . Heart disease Maternal Grandfather   . Heart disease Father        Prior to Admission medications   Medication Sig Start Date End Date Taking? Authorizing Provider  Albuterol Sulfate (PROAIR RESPICLICK) 108 (90 Base) MCG/ACT AEPB Inhale 2 puffs into the lungs every 4 (four) hours as needed. 03/13/16  Yes Sherren Mocha, MD  aspirin 325 MG EC tablet Take 325 mg by mouth every 6 (six) hours as needed for pain.   Yes Historical Provider, MD  HOMEOPATHIC PRODUCTS PO Take 1 capsule by mouth daily as needed (  flu symptoms).   Yes Historical Provider, MD  ipratropium-albuterol (DUONEB) 0.5-2.5 (3) MG/3ML SOLN Take 3 mLs by nebulization every 4 (four) hours as needed. Patient taking differently: Take 3 mLs by nebulization every 4 (four) hours as  needed (sob and wheezing).  03/23/16  Yes Gwenlyn Found Copland, MD  levofloxacin (LEVAQUIN) 500 MG tablet Take 1 tablet (500 mg total) by mouth daily. 03/23/16  Yes Gwenlyn Found Copland, MD  Vitamin D, Ergocalciferol, (DRISDOL) 50000 UNITS CAPS Take 50,000 Units by mouth every 7 (seven) days. Reported on 03/23/2016   Yes Historical Provider, MD  budesonide-formoterol (SYMBICORT) 160-4.5 MCG/ACT inhaler Inhale 2 puffs into the lungs 2 (two) times daily. 03/13/16   Sherren Mocha, MD  predniSONE (DELTASONE) 20 MG tablet Take 2 pills a day for 3 days, then 1 pill a day for 3 days 03/13/16   Sherren Mocha, MD  Spacer/Aero-Holding Chambers (NESSI SPACER WITH MASK SM/MED) [facility] Use as directed with albuterol inhaler 03/13/16   Sherren Mocha, MD     Physical Exam: Filed Vitals:   03/24/16 1500 03/24/16 1639 03/24/16 1858  BP: 144/72 136/77 124/73  Pulse: 98 95 101  Temp: 97.7 F (36.5 C)  98.4 F (36.9 C)  TempSrc: Oral  Oral  Resp: SpO2: 100% 99% 96%     Constitutional: Vital signs reviewed. Patient is a ill-appearing, but not toxic female Head: Normocephalic and atraumatic  Ear: TM normal bilaterally  Mouth: no erythema or exudates, MMM  Eyes: PERRL, EOMI, conjunctivae normal, No scleral icterus.  Neck: Supple, Trachea midline normal ROM, No JVD, mass, thyromegaly, or carotid bruit present.  Cardiovascular: Tachycardic Pulmonary/Chest: Tachypneic with diffuse coarse breath sounds and rhonchi noted throughout both lung fields.  Abdominal: Soft. Non-tender, non-distended, bowel sounds are normal, no masses, organomegaly, or guarding present.  GU: no CVA tenderness Musculoskeletal: No joint deformities, erythema, or stiffness, ROM full and no nontender Ext: no edema and no cyanosis, pulses palpable bilaterally (DP and PT)  Hematology: no cervical, inginal, or axillary adenopathy.  Neurological: A&O x3, Strenght is normal and symmetric bilaterally, cranial nerve II-XII are grossly intact, no focal motor  deficit, sensory intact to light touch bilaterally.  Skin: Warm, dry and intact. No rash, cyanosis, or clubbing.  Psychiatric: Normal mood and affect. speech and behavior is normal. Judgment and thought content normal. Cognition and memory are normal.      Data Review   Micro Results Recent Results (from the past 240 hour(s))  Blood Culture (routine x 2)     Status: None (Preliminary result)   Collection Time: 03/24/16  5:00 PM  Result Value Ref Range Status   Specimen Description BLOOD RIGHT FOREARM  Final   Special Requests BOTTLES DRAWN AEROBIC AND ANAEROBIC 5 ML  Final   Culture PENDING  Incomplete   Report Status PENDING  Incomplete    Radiology Reports Dg Chest 2 View  03/23/2016  CLINICAL DATA:  Wheezing, fever EXAM: CHEST  2 VIEW COMPARISON:  10/09/2014 FINDINGS: Diffuse interstitial prominence throughout the lungs, worsened since prior study. Heart is normal size. No effusions. No acute bony abnormality. IMPRESSION: Severe diffuse interstitial prominence throughout the lungs, worsening since prior study. This likely reflects underlying chronic interstitial lung disease. Cannot exclude superimposed interstitial pneumonia. Electronically Signed   By: Charlett Nose M.D.   On: 03/23/2016 15:24     CBC  Recent Labs Lab 03/24/16 1511  WBC 11.0*  HGB 10.7*  HCT 31.7*  PLT 366  MCV 87.1  MCH 29.4  MCHC 33.8  RDW 13.4  LYMPHSABS 1.9  MONOABS 0.8  EOSABS 0.1  BASOSABS 0.0    Chemistries   Recent Labs Lab 03/24/16 1511  NA 139  K 3.6  CL 104  CO2 22  GLUCOSE 99  BUN 11  CREATININE 0.53  CALCIUM 8.7*   ------------------------------------------------------------------------------------------------------------------ estimated creatinine clearance is 58.1 mL/min (by C-G formula based on Cr of 0.53). ------------------------------------------------------------------------------------------------------------------ No results for input(s): HGBA1C in the last 72  hours. ------------------------------------------------------------------------------------------------------------------ No results for input(s): CHOL, HDL, LDLCALC, TRIG, CHOLHDL, LDLDIRECT in the last 72 hours. ------------------------------------------------------------------------------------------------------------------ No results for input(s): TSH, T4TOTAL, T3FREE, THYROIDAB in the last 72 hours.  Invalid input(s): FREET3 ------------------------------------------------------------------------------------------------------------------ No results for input(s): VITAMINB12, FOLATE, FERRITIN, TIBC, IRON, RETICCTPCT in the last 72 hours.  Coagulation profile No results for input(s): INR, PROTIME in the last 168 hours.  No results for input(s): DDIMER in the last 72 hours.  Cardiac Enzymes No results for input(s): CKMB, TROPONINI, MYOGLOBIN in the last 168 hours.  Invalid input(s): CK ------------------------------------------------------------------------------------------------------------------ Invalid input(s): POCBNP   CBG: No results for input(s): GLUCAP in the last 168 hours.     EKG: Independently reviewed. Sinus rhythm with nonspecific T-wave abnormalities of the lateral leads   Assessment/Plan Sepsis secondary to CAP (community acquired pneumonia): Acute. Patient with a recent history of flulike illness presumed to be flu given sign diagnosed with flu prior to onset of the patient's symptoms. Reports initially getting better then getting worse with cough and fever. Initially HR up to 118, RR 24 , WBC 11, and  lactic acid 1.81.  Chest x-ray showing chronic interstitial lung changes, but did not rule out underlying infection. Suspect post influenza pneumonia. - Admit to a telemetry bed - Antibiotics of azithromycin and Rocephin IV - Continuous pulse oximetry with nasal cannula oxygen and to keep O2 sats greater than 92% - Prednisone 60 mg daily - Follow up blood and  sputum cultures - Gentle IV fluids of normal saline 75 ml /hr - DuoNeb's prn sob and/or wheezing - Budesonide and Brovana nebs twice a day - Mucinex - Tylenol when necessary fever - Tessalon Perles prn cough  Interstitial lung disease - Will need further information   Anemia: Hemoglobin 10.7 on admission - Repeat CBC in a.m.  Code Status:   full Family Communication: bedside Disposition Plan: admit   Total time spent 55 minutes.Greater than 50% of this time was spent in counseling, explanation of diagnosis, planning of further management, and coordination of care  Clydie BraunRondell A Annaliza Zia [facility] Pager [phone]  If 7PM-7AM, please contact night-coverage [email or web address] Password [email or web address] 03/24/2016, 7:58 PM

## 2016-03-24 NOTE — ED Notes (Signed)
Per pt, states diagnosed with PNA yesterday-having increased shortness of breath

## 2016-03-24 NOTE — ED Provider Notes (Signed)
CSN: [id number]     Arrival date & time 03/24/16  1454 History   First MD Initiated Contact with Patient 03/24/16 1523     Chief Complaint  Patient presents with  . PNA      (Consider location/radiation/quality/duration/timing/severity/associated sxs/prior Treatment) The history is provided by the patient and medical records. No language interpreter was used.     Julie Page is a 65 y.o. female  with a hx of Raynaud's, high cholesterol, "lung scarring - autoimmune," never smoker presents to the Emergency Department complaining of gradual, persistent, progressively worsening SOB, recurrent fevers, cough onset 5 days ago and worsening 2 days ago.  Pt reports ILI 2 weeks ago with fevers to 102.5 with improvement over the last week.  Fevers and cough returned over the weekend with fever to 101.4.  She saw her PCP yesterday and was given duoneb, levaquin.  She felt worse today and presented to her PCP who found her tachycardic and hypoxic.  She reports feeling better on oxygen.  Pt's son at bedside reports she develops tachypnea with even the most basic tasks.  Pt normally performs all ADLs without difficulty.  She has no risk for HCAP.  Pt was given duoneb at PCP this morning with some improvement, but continued to require oxygen.  Pt denies neck pain, chest pain, abd pain, N/V/D, weakness, dizziness, syncope, all urinary symptoms.      Record review shows CXR from yesterday:  IMPRESSION: Severe diffuse interstitial prominence throughout the lungs, worsening since prior study. This likely reflects underlying chronic interstitial lung disease. Cannot exclude superimposed interstitial pneumonia.  PCP: Abbe AmsterdamJessica Copland.    Past Medical History  Diagnosis Date  . Raynaud's syndrome   . Hypercholesteremia    Past Surgical History  Procedure Laterality Date  . Tubal ligation  03/30/1978   Family History  Problem Relation Age of Onset  . Heart disease Mother   . Heart disease Maternal  Grandfather   . Heart disease Father    Social History  Substance Use Topics  . Smoking status: Never Smoker   . Smokeless tobacco: Never Used  . Alcohol Use: No     Comment: rare ETOH use   OB History    No data available     Review of Systems  Constitutional: Positive for fever and fatigue. Negative for diaphoresis, appetite change and unexpected weight change.  HENT: Negative for mouth sores.   Eyes: Negative for visual disturbance.  Respiratory: Positive for cough, shortness of breath and wheezing. Negative for chest tightness.   Cardiovascular: Negative for chest pain.  Gastrointestinal: Negative for nausea, vomiting, abdominal pain, diarrhea and constipation.  Endocrine: Negative for polydipsia, polyphagia and polyuria.  Genitourinary: Negative for dysuria, urgency, frequency and hematuria.  Musculoskeletal: Negative for back pain and neck stiffness.  Skin: Negative for rash.  Allergic/Immunologic: Negative for immunocompromised state.  Neurological: Negative for syncope, light-headedness and headaches.  Hematological: Does not bruise/bleed easily.  Psychiatric/Behavioral: Negative for sleep disturbance. The patient is not nervous/anxious.       Allergies  Review of patient's allergies indicates no known allergies.  Home Medications   Prior to Admission medications   Medication Sig Start Date End Date Taking? Authorizing Provider  Albuterol Sulfate (PROAIR RESPICLICK) 108 (90 Base) MCG/ACT AEPB Inhale 2 puffs into the lungs every 4 (four) hours as needed. 03/13/16  Yes Sherren MochaEva N Shaw, MD  aspirin 325 MG EC tablet Take 325 mg by mouth every 6 (six) hours as needed for pain.  Yes Historical Provider, MD  HOMEOPATHIC PRODUCTS PO Take 1 capsule by mouth daily as needed (flu symptoms).   Yes Historical Provider, MD  ipratropium-albuterol (DUONEB) 0.5-2.5 (3) MG/3ML SOLN Take 3 mLs by nebulization every 4 (four) hours as needed. Patient taking differently: Take 3 mLs by  nebulization every 4 (four) hours as needed (sob and wheezing).  03/23/16  Yes Gwenlyn Found Copland, MD  levofloxacin (LEVAQUIN) 500 MG tablet Take 1 tablet (500 mg total) by mouth daily. 03/23/16  Yes Gwenlyn Found Copland, MD  Vitamin D, Ergocalciferol, (DRISDOL) 50000 UNITS CAPS Take 50,000 Units by mouth every 7 (seven) days. Reported on 03/23/2016   Yes Historical Provider, MD  budesonide-formoterol (SYMBICORT) 160-4.5 MCG/ACT inhaler Inhale 2 puffs into the lungs 2 (two) times daily. 03/13/16   Sherren Mocha, MD  predniSONE (DELTASONE) 20 MG tablet Take 2 pills a day for 3 days, then 1 pill a day for 3 days 03/13/16   Sherren Mocha, MD  Spacer/Aero-Holding Chambers (NESSI SPACER WITH MASK SM/MED) [facility] Use as directed with albuterol inhaler 03/13/16   Sherren Mocha, MD   BP 136/77 mmHg  Pulse 95  Temp(Src) 97.7 F (36.5 C) (Oral)  Resp 24  SpO2 99% Physical Exam  Constitutional: She appears well-developed and well-nourished. No distress.  Awake, alert, nontoxic appearance  HENT:  Head: Normocephalic and atraumatic.  Mouth/Throat: Oropharynx is clear and moist. No oropharyngeal exudate.  Eyes: Conjunctivae are normal. No scleral icterus.  Neck: Normal range of motion. Neck supple.  Cardiovascular: Regular rhythm, normal heart sounds and intact distal pulses.  Tachycardia present.   No murmur heard. Pulses:      Radial pulses are 2+ on the right side, and 2+ on the left side.  Pulmonary/Chest: Effort normal. No respiratory distress. She has decreased breath sounds (bilateral lower lobes). She has no wheezes. She has rhonchi in the right middle field, the right lower field, the left middle field and the left lower field.  Equal chest expansion  Abdominal: Soft. Bowel sounds are normal. She exhibits no mass. There is no tenderness. There is no rebound and no guarding.  Musculoskeletal: Normal range of motion. She exhibits no edema.  Neurological: She is alert.  Speech is clear and goal oriented Moves  extremities without ataxia  Skin: Skin is warm and dry. She is not diaphoretic.  Psychiatric: She has a normal mood and affect.  Nursing note and vitals reviewed.   ED Course  Procedures (including critical care time) Labs Review Labs Reviewed  CBC WITH DIFFERENTIAL/PLATELET - Abnormal; Notable for the following:    WBC 11.0 (*)    RBC 3.64 (*)    Hemoglobin 10.7 (*)    HCT 31.7 (*)    Neutro Abs 8.2 (*)    All other components within normal limits  BASIC METABOLIC PANEL - Abnormal; Notable for the following:    Calcium 8.7 (*)    All other components within normal limits  URINALYSIS, ROUTINE W REFLEX MICROSCOPIC (NOT AT [facility]) - Abnormal; Notable for the following:    Ketones, ur 40 (*)    Leukocytes, UA MODERATE (*)    All other components within normal limits  URINE MICROSCOPIC-ADD ON - Abnormal; Notable for the following:    Squamous Epithelial / LPF 6-30 (*)    Bacteria, UA RARE (*)    All other components within normal limits  CULTURE, BLOOD (ROUTINE X 2)  CULTURE, BLOOD (ROUTINE X 2)  I-STAT CG4 LACTIC ACID, ED  Imaging Review Dg Chest 2 View  03/23/2016  CLINICAL DATA:  Wheezing, fever EXAM: CHEST  2 VIEW COMPARISON:  10/09/2014 FINDINGS: Diffuse interstitial prominence throughout the lungs, worsened since prior study. Heart is normal size. No effusions. No acute bony abnormality. IMPRESSION: Severe diffuse interstitial prominence throughout the lungs, worsening since prior study. This likely reflects underlying chronic interstitial lung disease. Cannot exclude superimposed interstitial pneumonia. Electronically Signed   By: Charlett Nose M.D.   On: 03/23/2016 15:24   I have personally reviewed and evaluated these images and lab results as part of my medical decision-making.   MDM   Final diagnoses:  Community acquired pneumonia  Hypoxia  Interstitial lung disease (HCC)   Concepcion Elk Fogel presents with CAP, failure of outpatient therapy and new oxygen  requirement.  Pt with Hypoxia at PCP and is requiring 2 L of oxygen via nasal cannula currently. She has failed her outpatient therapy. She will need admission.  Lordosis of 11. Anemia 10.7. Lactic acid within normal limits. Patient will be admitted for IV antibiotics. Given Rocephin and azithromycin here in the emergency department..    The patient was discussed with and seen by Dr. Cyndie Chime who agrees with the treatment plan.    Dahlia Client Mardell Suttles, PA-C 03/24/16 1837  Leta Baptist, MD 03/25/16 [phone]

## 2016-03-24 NOTE — Progress Notes (Addendum)
Pharmacy Antibiotic Follow-up Note  Marlou PorchCarolyn L Flores is a 65 y.o. year-old female admitted on 03/24/2016.  The patient is currently on day 1 of Rocephin & Azithromycin  for PNA.  Assessment/Plan: This patient's current antibiotics will be continued without adjustments.  Pharmacy will sign off protocol  Temp (24hrs), Avg:97.8 F (36.6 C), Min:97.7 F (36.5 C), Max:97.9 F (36.6 C)   Recent Labs Lab 03/24/16 1511  WBC 11.0*    Recent Labs Lab 03/24/16 1511  CREATININE 0.53   Estimated Creatinine Clearance: 58.1 mL/min (by C-G formula based on Cr of 0.53).    No Known Allergies  Antimicrobials this admission: 4/6 Rocephin >>  4/6 Azithromycin >>   Microbiology results: 4/6 BCx: sent  Thank you for allowing pharmacy to be a part of this patient's care.  Otho BellowsGreen, Curtez Brallier L PharmD 03/24/2016 4:47 PM

## 2016-03-25 ENCOUNTER — Inpatient Hospital Stay (HOSPITAL_COMMUNITY): Payer: Medicare Other

## 2016-03-25 DIAGNOSIS — R6510 Systemic inflammatory response syndrome (SIRS) of non-infectious origin without acute organ dysfunction: Secondary | ICD-10-CM | POA: Diagnosis present

## 2016-03-25 DIAGNOSIS — R0902 Hypoxemia: Secondary | ICD-10-CM

## 2016-03-25 DIAGNOSIS — A419 Sepsis, unspecified organism: Secondary | ICD-10-CM | POA: Diagnosis present

## 2016-03-25 DIAGNOSIS — J849 Interstitial pulmonary disease, unspecified: Secondary | ICD-10-CM

## 2016-03-25 DIAGNOSIS — J189 Pneumonia, unspecified organism: Principal | ICD-10-CM

## 2016-03-25 DIAGNOSIS — D649 Anemia, unspecified: Secondary | ICD-10-CM | POA: Diagnosis present

## 2016-03-25 LAB — CBC WITH DIFFERENTIAL/PLATELET
BASOS ABS: 0 10*3/uL (ref 0.0–0.1)
Basophils Relative: 0 %
EOS PCT: 2 %
Eosinophils Absolute: 0.2 10*3/uL (ref 0.0–0.7)
HEMATOCRIT: 30.8 % — AB (ref 36.0–46.0)
Hemoglobin: 10.2 g/dL — ABNORMAL LOW (ref 12.0–15.0)
LYMPHS ABS: 2.5 10*3/uL (ref 0.7–4.0)
Lymphocytes Relative: 25 %
MCH: 29.5 pg (ref 26.0–34.0)
MCHC: 33.1 g/dL (ref 30.0–36.0)
MCV: 89 fL (ref 78.0–100.0)
MONOS PCT: 7 %
Monocytes Absolute: 0.7 10*3/uL (ref 0.1–1.0)
Neutro Abs: 6.6 10*3/uL (ref 1.7–7.7)
Neutrophils Relative %: 66 %
Platelets: 338 10*3/uL (ref 150–400)
RBC: 3.46 MIL/uL — ABNORMAL LOW (ref 3.87–5.11)
RDW: 13.7 % (ref 11.5–15.5)
WBC: 10.1 10*3/uL (ref 4.0–10.5)

## 2016-03-25 LAB — BASIC METABOLIC PANEL
Anion gap: 11 (ref 5–15)
BUN: 8 mg/dL (ref 6–20)
CO2: 19 mmol/L — ABNORMAL LOW (ref 22–32)
CREATININE: 0.48 mg/dL (ref 0.44–1.00)
Calcium: 8.2 mg/dL — ABNORMAL LOW (ref 8.9–10.3)
Chloride: 110 mmol/L (ref 101–111)
GFR calc Af Amer: >60 mL/min (ref >60)
GFR calc non Af Amer: >60 mL/min (ref >60)
Glucose, Bld: 90 mg/dL (ref 65–99)
Potassium: 3.6 mmol/L (ref 3.5–5.1)
SODIUM: 140 mmol/L (ref 135–145)

## 2016-03-25 LAB — BRAIN NATRIURETIC PEPTIDE: B Natriuretic Peptide: 200.2 pg/mL — ABNORMAL HIGH (ref 0.0–100.0)

## 2016-03-25 LAB — STREP PNEUMONIAE URINARY ANTIGEN: Strep Pneumo Urinary Antigen: NEGATIVE

## 2016-03-25 MED ORDER — CETYLPYRIDINIUM CHLORIDE 0.05 % MT LIQD
7.0000 mL | Freq: Two times a day (BID) | OROMUCOSAL | Status: DC
  Administered 2016-03-25 – 2016-03-30 (×9): 7 mL via OROMUCOSAL

## 2016-03-25 MED ORDER — IPRATROPIUM-ALBUTEROL 0.5-2.5 (3) MG/3ML IN SOLN
3.0000 mL | Freq: Three times a day (TID) | RESPIRATORY_TRACT | Status: DC
  Administered 2016-03-25 – 2016-03-31 (×18): 3 mL via RESPIRATORY_TRACT
  Filled 2016-03-25 (×18): qty 3

## 2016-03-25 NOTE — Progress Notes (Signed)
Progress Note   Julie Page ZOX:[id number] DOB: Jul 21, 1951 DOA: 03/24/2016 PCP: Abbe Amsterdam, MD   Brief Narrative:   Julie Page is an 65 y.o. 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 Delton Coombes of pulmonology/rheumatology, who was admitted 03/24/16 with a history of flulike symptoms including fever and cough, diagnosed with pneumonia by her PCP and placed on Levaquin, after failing outpatient treatment. Does not take steroids chronically. Apparently, autoimmune work up suggestive of Sjogrens (no symptoms of dry eye/mouth) per patient report.  ANA negative in 2012. RA elevated. Scleroderma Scl-70 not elevated.  Assessment/Plan:   Principal Problem:   CAP (community acquired pneumonia) with SIRS Chest x-ray shows interstitial changes that have worsened. There were some concern for sepsis on admission, but there is no evidence of end organ damage to suggest sepsis. Strep pneumonia negative, legionella pending. Requested sputum cultures. Blood culture sent. Outpatient influenza studies negative.  Active Problems:   Interstitial lung disease (HCC) We'll request pulmonology evaluation. Continue prednisone, Pulmicort, Brovana and bronchodilators.    Normocytic Anemia Likely anemia of chronic disease.    DVT Prophylaxis Lovenox ordered.   Family Communication/Anticipated D/C date and plan/Code Status   Family Communication: Sons updated at bedside. Disposition Plan/date: Home when respiratory status stable. Code Status: Full code.   Procedures and diagnostic studies:   Dg Chest 2 View  03/23/2016  CLINICAL DATA:  Wheezing, fever EXAM: CHEST  2 VIEW COMPARISON:  10/09/2014 FINDINGS: Diffuse interstitial prominence throughout the lungs, worsened since prior study. Heart is normal size. No effusions. No acute bony abnormality. IMPRESSION: Severe diffuse interstitial prominence throughout the lungs, worsening  since prior study. This likely reflects underlying chronic interstitial lung disease. Cannot exclude superimposed interstitial pneumonia. Electronically Signed   By: Charlett Nose M.D.   On: 03/23/2016 15:24    Medical Consultants:    Pulmonology  Anti-Infectives:   Rocephin/azithromycin 03/24/16--->   Subjective:   Julie Page feels short of breath, occasional cough.  No chest pain.  No joint swelling.  No chills.    Objective:    Filed Vitals:   03/24/16 2106 03/24/16 2152 03/25/16 0430 03/25/16 0747  BP: 172/84  120/71   Pulse: 114  111 117  Temp: 99.1 F (37.3 C)  98 F (36.7 C)   TempSrc: Oral  Oral   Resp: Height:      Weight:      SpO2: 93% 94% 98% 92%    Intake/Output Summary (Last 24 hours) at 03/25/16 0808 Last data filed at 03/25/16 0729  Gross per 24 hour  Intake 711.25 ml  Output    200 ml  Net 511.25 ml   Filed Weights   03/24/16 2055  Weight: 61.281 kg (135 lb 1.6 oz)    Exam: Gen:  Ill appearing Cardiovascular:  Tachy, No M/R/G Respiratory:  Lungs with bilateral crackles 2/3 way up Gastrointestinal:  Abdomen soft, NT/ND, + BS Extremities:  No C/E/C   Data Reviewed:    Labs: Basic Metabolic Panel:  Recent Labs Lab 03/24/16 1511 03/25/16 0502  NA 139 140  K 3.6 3.6  CL 104 110  CO2 22 19*  GLUCOSE 99 90  BUN 11 8  CREATININE 0.53 0.48  CALCIUM 8.7* 8.2*   GFR Estimated Creatinine Clearance: 58.1 mL/min (by C-G formula based on Cr of 0.48).  CBC:  Recent Labs Lab 03/24/16 1511 03/25/16 0502  WBC  11.0* 10.1  NEUTROABS 8.2* 6.6  HGB 10.7* 10.2*  HCT 31.7* 30.8*  MCV 87.1 89.0  PLT 366 338   Sepsis Labs:  Recent Labs Lab 03/24/16 1511 03/24/16 1713 03/25/16 0502  WBC 11.0*  --  10.1  LATICACIDVEN  --  1.81  --    Microbiology Recent Results (from the past 240 hour(s))  Blood Culture (routine x 2)     Status: None (Preliminary result)   Collection Time: 03/24/16  5:00 PM  Result Value Ref  Range Status   Specimen Description BLOOD RIGHT FOREARM  Final   Special Requests BOTTLES DRAWN AEROBIC AND ANAEROBIC 5 ML  Final   Culture PENDING  Incomplete   Report Status PENDING  Incomplete     Medications:   . antiseptic oral rinse  7 mL Mouth Rinse BID  . arformoterol  15 mcg Nebulization BID  . azithromycin  500 mg Intravenous Q24H  . budesonide (PULMICORT) nebulizer solution  0.5 mg Nebulization BID  . cefTRIAXone (ROCEPHIN)  IV  1 g Intravenous Q24H  . enoxaparin (LOVENOX) injection  40 mg Subcutaneous Q24H  . guaiFENesin  600 mg Oral BID  . ipratropium-albuterol  3 mL Nebulization TID  . predniSONE  60 mg Oral Q breakfast   Continuous Infusions: . sodium chloride 75 mL/hr at 03/24/16 2131    Time spent: 35 minutes with > 50% of time discussing current diagnostic test results, clinical impression and plan of care.   LOS: 1 day   RAMA,CHRISTINA  [facility] Pager [phone]. If unable to reach me by pager, please call my cell phone at [phone].  *Please refer to [email or web address], password [email or web address] to get updated schedule on who will round on this patient, as hospitalists switch teams weekly. If 7PM-7AM, please contact night-coverage at [email or web address], password [email or web address] for any overnight needs.  03/25/2016, 8:08 AM

## 2016-03-25 NOTE — Consult Note (Signed)
Name: Julie Page MRN: [id number] DOB: 09/16/1951    ADMISSION DATE:  03/24/2016 CONSULTATION DATE:  03/25/16  REFERRING MD :  Dr. Darnelle Catalanama   CHIEF COMPLAINT:  Cough, SOB    HISTORY OF PRESENT ILLNESS:  65 year old female with PMH of never smoker, Raynaud's, high cholesterol and suspected history of ILD who presented to ED on 4/6 with complaints progressive shortness of breath, cough, intermittent fevers for 5 days. Additionally she reported influenza like illness 2 weeks ago with 102.5 fevers for one week, then improved but over last weekend, with fever of 101.4. She was seen by her PCP on 4/5 and treated with rocephin IM and placed on levaquin and duoneb. She went home however felt worse the next day, went back to her PCP and then was found to be hypoxic and tachycardic and sent to ED.  It appears she was last seen by Dr. Delton CoombesByrum 03/2012 for workup and probable diagnosis of ILD. She had a positive RF and negative auto-immune workup at that time. She was scheduled for a VATS per Dr. Tyrone SageGerhardt, however patient wanted to see rheumatology before proceeding. Unfortunately, she has not followed through and has intermittently been seen by her PCP for intermittent cough, fever, and fatigue. Patient reports she did research regarding VATS biopsy and felt it wasn't needed.  Upon admission patient is evaluated and seen to have heart rate of 114, respirations 22, O2 saturations 90%. Initial lab work revealed a WBC of 11, hemoglobin at 10.7, lactic acid 1.81. Chest x-ray showing diffuse interstitial prominence throughout both lungs reflecting chronic interstitial lung disease. She was placed on Rocephin and azithromycin IV in the ED. Admitted by [facility]. While inpatient, she has been hesitant to use prednisone while inpatient as she is concerned about the side effects. At baseline she does not take any medications. She also reports that she uses homeopathic medications (Natures Sunshine) for lung  and adrenal health through her naturopathic practitioner Theodora BlowJill Clarey.  PCCM consulting for further eval of interstitial lung disease.   Patient reports 3+ years of dry cough. She states that she thinks she has a laryngeal nerve injury from a previous viral illness that caused her to have a chronic cough (not diagnosed by M.D., Internet research only).  PULMONARY HX:  Work: Scientific laboratory technicianffice/clerical work.  Patient reports concern that current office building correlates with her illness approximately 10 years ago. No specific complaints such as mold ATC.  Recreational:  No inhaled drugs  Living situation:  [place] has predominantly lived in [place] her entire life. She has lived in the city & [place].  She had a wood burning stove for heat in her adult life 5 years. Her husband smoked during their marriage and her parents smoked during her childhood.  Pets:  Patient denies Orthoptistexotic animals / birds  Activity:  The patient reports she was very active approximately 10 years ago able to walk every day at the park. Over the last years she has declined to walking in the park at best 1-3 times per month. She continues to be able to bathe herself and provide ADLs.  PAST MEDICAL HISTORY :   has a past medical history of Raynaud's syndrome and Hypercholesteremia.  has past surgical history that includes Tubal ligation (03/30/1978).   Prior to Admission medications   Medication Sig Start Date End Date Taking? Authorizing Provider  Albuterol Sulfate (PROAIR RESPICLICK) 108 (90 Base) MCG/ACT AEPB Inhale 2 puffs into the lungs every 4 (  four) hours as needed. 03/13/16  Yes Sherren Mocha, MD  aspirin 325 MG EC tablet Take 325 mg by mouth every 6 (six) hours as needed for pain.   Yes Historical Provider, MD  HOMEOPATHIC PRODUCTS PO Take 1 capsule by mouth daily as needed (flu symptoms).   Yes Historical Provider, MD  ipratropium-albuterol (DUONEB) 0.5-2.5 (3) MG/3ML SOLN Take 3 mLs by  nebulization every 4 (four) hours as needed. Patient taking differently: Take 3 mLs by nebulization every 4 (four) hours as needed (sob and wheezing).  03/23/16  Yes Gwenlyn Found Copland, MD  levofloxacin (LEVAQUIN) 500 MG tablet Take 1 tablet (500 mg total) by mouth daily. 03/23/16  Yes Gwenlyn Found Copland, MD  Vitamin D, Ergocalciferol, (DRISDOL) 50000 UNITS CAPS Take 50,000 Units by mouth every 7 (seven) days. Reported on 03/23/2016   Yes Historical Provider, MD  budesonide-formoterol (SYMBICORT) 160-4.5 MCG/ACT inhaler Inhale 2 puffs into the lungs 2 (two) times daily. 03/13/16   Sherren Mocha, MD  predniSONE (DELTASONE) 20 MG tablet Take 2 pills a day for 3 days, then 1 pill a day for 3 days 03/13/16   Sherren Mocha, MD  Spacer/Aero-Holding Chambers (NESSI SPACER WITH MASK SM/MED) [facility] Use as directed with albuterol inhaler 03/13/16   Sherren Mocha, MD   No Known Allergies  FAMILY HISTORY:  family history includes Heart disease in her father, maternal grandfather, and mother.   SOCIAL HISTORY:  reports that she has never smoked. She has never used smokeless tobacco. She reports that she does not drink alcohol or use illicit drugs.  REVIEW OF SYSTEMS:   Constitutional: Negative for fever, chills, weight loss, malaise/fatigue and diaphoresis.  HENT: Negative for hearing loss, ear pain, nosebleeds, congestion, sore throat, neck pain, tinnitus and ear discharge.   Eyes: Negative for blurred vision, double vision, photophobia, pain, discharge and redness.  Respiratory: Negative for cough, hemoptysis, sputum production, shortness of breath, wheezing and stridor.   Cardiovascular: Negative for chest pain, palpitations, orthopnea, claudication, leg swelling and PND.  Gastrointestinal: Negative for heartburn, nausea, vomiting, abdominal pain, diarrhea, constipation, blood in stool and melena.  Genitourinary: Negative for dysuria, urgency, frequency, hematuria and flank pain.  Musculoskeletal: Negative for myalgias,  back pain, joint pain and falls.  Skin: Negative for itching and rash on chest wall and right posterior back from Vicks VapoRub.  Neurological: Negative for dizziness, tingling, tremors, sensory change, speech change, focal weakness, seizures, loss of consciousness, weakness and headaches.  Endo/Heme/Allergies: Negative for environmental allergies and polydipsia. Does not bruise/bleed easily.  SUBJECTIVE:   VITAL SIGNS: Temp:  [98 F (36.7 C)-99.1 F (37.3 C)] 98 F (36.7 C) (04/07 1500) Pulse Rate:  [95-117] 112 (04/07 1500) Resp:  [22-24] 22 (04/07 1500) BP: (120-172)/(71-85) 130/85 mmHg (04/07 1500) SpO2:  [92 %-99 %] 94 % (04/07 1500) Weight:  [135 lb 1.6 oz (61.281 kg)] 135 lb 1.6 oz (61.281 kg) (04/06 2055)  PHYSICAL EXAMINATION: General: Well-developed adult female in no acute distress Neuro:  AAO 4, speech clear, MAE PSY:  Poor eye contact, flat affect HEENT:  MM pink/moist, good dentition, multiple fillings, no JVD or LAN Cardiovascular:  S1-S2 RRR, no M/R/G Lungs:  Even/nonlabored on 6 L per nasal cannula, lungs bilaterally with Velcro crackles approximately 1/2 to2/3 of the way up Abdomen:  Soft/nontender, bowel sounds 4 active Musculoskeletal:  No acute deformities Skin:  Warm/dry, no edema, large telangiectasia noted on anterior chest wall   Recent Labs Lab 03/24/16 1511 03/25/16 0502  NA  139 140  K 3.6 3.6  CL 104 110  CO2 22 19*  BUN 11 8  CREATININE 0.53 0.48  GLUCOSE 99 90    Recent Labs Lab 03/24/16 1511 03/25/16 0502  HGB 10.7* 10.2*  HCT 31.7* 30.8*  WBC 11.0* 10.1  PLT 366 338   No results found.   SIGNIFICANT EVENTS  4/06  admit with cough and shortness of breath, chest x-ray concerning for progression of ILD 4/07  PCCM consulted  STUDIES:  03/22/2012 CT Chest >> mildly progressive peripheral reticular interstitial accentuation w/mild associated bronchiectasis 03/25/2016 CXR 2 View >> Severe diffuse interstitial throughout the lungs,  worse since prior study, likely reflecting underlying chronic interstitial lung disease; cannot exlude superimposed interstitial pneumonia. 03/25/2016  HR CT Chest >>  CULTURES:  BCx2 4/6 >>  Sputum 4/6 >>   ABX:  Azithromycin 4/6 >> Ceftriaxone 4/6 >>  ASSESSMENT / PLAN:   1. Concern for ILD - chest x-ray on admission demonstrates concern for progressive ILD compared to 2013. The patient is known to have positive RA/Sjogren's workup with rheumatology in the past (08/08/2011 RF of 24, see Dr. Kavin Leech note from 4/ 22/2013).  She previously has had normal PFTs.  Chart review reflects she has had multiple prior bouts of CAP.  She denies aspiration/difficulty swallowing.  Differential diagnosis includes ILD flare in the setting of suspected autoimmune disease, viral pneumonitis, MAC, and CHF.  Assess echo, BNP and high resolution CT of the chest with inspiratory and expiratory cuts.  Continue antibiotics and prednisone therapy. 2. Hypoxic Respiratory Failure - in setting of progression of suspected interstitial lung disease.  Continue oxygen therapy for goal saturations greater than 92%.  Institute pulmonary hygiene.  Will need ambulatory assessment of oxygen needs prior to discharge.  GLOBAL:  The patient appears to have a degree of denial regarding possible diagnosis / resistance to traditional medical therapies.  She has decided to not follow up in the past regarding further work up and seek naturopathic approach.  This may be a [place] to managing disease process.   Canary Brim, NP-C [facility] Pgr: [phone] or if no answer [phone] 03/25/2016, 5:04 PM

## 2016-03-25 NOTE — Progress Notes (Signed)
Pt informed RN of local reaction to azithromycin IV during infusion last pm. Dr. Darnelle Catalanama notified and order received to discontinue azithromycin.  Azithromycin added to allergy list and reaction noted. Maeola Harmanark, Robbye Dede Johnson

## 2016-03-26 ENCOUNTER — Inpatient Hospital Stay (HOSPITAL_COMMUNITY): Payer: Medicare Other

## 2016-03-26 DIAGNOSIS — J189 Pneumonia, unspecified organism: Secondary | ICD-10-CM | POA: Insufficient documentation

## 2016-03-26 DIAGNOSIS — J80 Acute respiratory distress syndrome: Secondary | ICD-10-CM

## 2016-03-26 LAB — ECHOCARDIOGRAM COMPLETE
Height: 60 in
Weight: 2161.6 oz

## 2016-03-26 LAB — LEGIONELLA PNEUMOPHILA SEROGP 1 UR AG: L. pneumophila Serogp 1 Ur Ag: NEGATIVE

## 2016-03-26 MED ORDER — LEVALBUTEROL HCL 0.63 MG/3ML IN NEBU: 0.6300 mg | INHALATION_SOLUTION | Freq: Once | RESPIRATORY_TRACT | Status: DC

## 2016-03-26 MED ORDER — ZOLPIDEM TARTRATE 5 MG PO TABS
5.0000 mg | ORAL_TABLET | Freq: Every evening | ORAL | Status: DC | PRN
  Administered 2016-03-26: 5 mg via ORAL
  Filled 2016-03-26: qty 1

## 2016-03-26 MED ORDER — METHYLPREDNISOLONE SODIUM SUCC 125 MG IJ SOLR
125.0000 mg | Freq: Two times a day (BID) | INTRAMUSCULAR | Status: AC
  Administered 2016-03-26 – 2016-03-29 (×7): 125 mg via INTRAVENOUS
  Filled 2016-03-26 (×7): qty 2

## 2016-03-26 MED ORDER — BENZONATATE 100 MG PO CAPS
200.0000 mg | ORAL_CAPSULE | Freq: Three times a day (TID) | ORAL | Status: DC | PRN
  Administered 2016-03-26 – 2016-03-31 (×11): 200 mg via ORAL
  Filled 2016-03-26 (×12): qty 2

## 2016-03-26 NOTE — Progress Notes (Signed)
Progress Note   RIKA DAUGHDRILL ZOX:[id number] DOB: July 27, 1951 DOA: 03/24/2016 PCP: Abbe Amsterdam, MD   Brief Narrative:   Julie Page is an 65 y.o. 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 Delton Coombes of pulmonology/rheumatology, who was admitted 03/24/16 with a history of flulike symptoms including fever and cough, diagnosed with pneumonia by her PCP and placed on Levaquin, after failing outpatient treatment. Does not take steroids chronically. Apparently, autoimmune work up suggestive of Sjogrens (no symptoms of dry eye/mouth) per patient report.  ANA negative in 2012. RA elevated. Scleroderma Scl-70 not elevated.  Assessment/Plan:   Principal Problem:   CAP (community acquired pneumonia) with SIRS Chest x-ray shows interstitial changes that have worsened. There were some concern for sepsis on admission, but there is no evidence of end organ damage to suggest sepsis.  Strep pneumonia negative and legionella also neg. Outpatient influenza studies negative. Continue supportive care, treatment for ILD flare as mentioned below and current antibiotic therapy  Active Problems:   Interstitial lung disease (HCC) Will follow pulmonary service rec's Plan is for high resolution CT, 2-D echo and high dose steroids Will continue current antibiotics, Pulmicort, Brovana and bronchodilators. Continue oxygen supplementation     Normocytic Anemia Likely anemia of chronic disease. Will monitor Hgb trend    DVT Prophylaxis Lovenox ordered.   Family Communication/Anticipated D/C date and plan/Code Status   Family Communication: whole family at bedside. Disposition Plan/date: Home when respiratory status stable. Code Status: Full code.   Procedures and diagnostic studies:   Dg Chest 2 View  03/23/2016  CLINICAL DATA:  Wheezing, fever EXAM: CHEST  2 VIEW COMPARISON:  10/09/2014 FINDINGS: Diffuse interstitial prominence  throughout the lungs, worsened since prior study. Heart is normal size. No effusions. No acute bony abnormality. IMPRESSION: Severe diffuse interstitial prominence throughout the lungs, worsening since prior study. This likely reflects underlying chronic interstitial lung disease. Cannot exclude superimposed interstitial pneumonia. Electronically Signed   By: Charlett Nose M.D.   On: 03/23/2016 15:24    Medical Consultants:    Pulmonology  Anti-Infectives:   Rocephin/azithromycin 03/24/16--->   Subjective:   Concepcion Elk Johanson afebrile. No chest pain; breathing uncheanged.  No joint swelling.  No chills.    Objective:    Filed Vitals:   03/26/16 0949 03/26/16 0951 03/26/16 0952 03/26/16 1354  BP:    167/79  Pulse:    118  Temp:    97.5 F (36.4 C)  TempSrc:    Oral  Resp:    20  Height:      Weight:      SpO2: 97% 97% 97% 97%    Intake/Output Summary (Last 24 hours) at 03/26/16 1820 Last data filed at 03/26/16 1500  Gross per 24 hour  Intake   1800 ml  Output    200 ml  Net   1600 ml   Filed Weights   03/24/16 2055  Weight: 61.281 kg (135 lb 1.6 oz)    Exam: Gen:  Ill appearing, with intermittent episodes of SOB and flat affect. Still requiring O2 supplementation.  Cardiovascular:  Tachy, No M/R/G; per telemetry SR. Respiratory:  Lungs with bibasilar crackles, positive rhonchi and end exp wheezing Gastrointestinal:  Abdomen soft, NT/ND, + BS Extremities:  No C/E/C   Data Reviewed:    Labs: Basic Metabolic Panel:  Recent Labs Lab 03/24/16 1511 03/25/16 0502  NA 139 140  K 3.6 3.6  CL  104 110  CO2 22 19*  GLUCOSE 99 90  BUN 11 8  CREATININE 0.53 0.48  CALCIUM 8.7* 8.2*   GFR Estimated Creatinine Clearance: 58.1 mL/min (by C-G formula based on Cr of 0.48).  CBC:  Recent Labs Lab 03/24/16 1511 03/25/16 0502  WBC 11.0* 10.1  NEUTROABS 8.2* 6.6  HGB 10.7* 10.2*  HCT 31.7* 30.8*  MCV 87.1 89.0  PLT 366 338   Sepsis Labs:  Recent Labs Lab  03/24/16 1511 03/24/16 1713 03/25/16 0502  WBC 11.0*  --  10.1  LATICACIDVEN  --  1.81  --    Microbiology Recent Results (from the past 240 hour(s))  Blood Culture (routine x 2)     Status: None (Preliminary result)   Collection Time: 03/24/16  4:58 PM  Result Value Ref Range Status   Specimen Description BLOOD LEFT ANTECUBITAL  Final   Special Requests BOTTLES DRAWN AEROBIC AND ANAEROBIC 5 ML  Final   Culture   Final    NO GROWTH 2 DAYS Performed at [facility]    Report Status PENDING  Incomplete  Blood Culture (routine x 2)     Status: None (Preliminary result)   Collection Time: 03/24/16  5:00 PM  Result Value Ref Range Status   Specimen Description BLOOD RIGHT FOREARM  Final   Special Requests BOTTLES DRAWN AEROBIC AND ANAEROBIC 5 ML  Final   Culture   Final    NO GROWTH 2 DAYS Performed at [facility]    Report Status PENDING  Incomplete     Medications:   . antiseptic oral rinse  7 mL Mouth Rinse BID  . arformoterol  15 mcg Nebulization BID  . budesonide (PULMICORT) nebulizer solution  0.5 mg Nebulization BID  . cefTRIAXone (ROCEPHIN)  IV  1 g Intravenous Q24H  . enoxaparin (LOVENOX) injection  40 mg Subcutaneous Q24H  . guaiFENesin  600 mg Oral BID  . ipratropium-albuterol  3 mL Nebulization TID  . levalbuterol  0.63 mg Nebulization Once  . methylPREDNISolone (SOLU-MEDROL) injection  125 mg Intravenous Q12H   Continuous Infusions: . sodium chloride 75 mL/hr at 03/26/16 0915    Time spent: 35 minutes with > 50% of time dedicated to face to face examination, discussing current diagnostic test results, clinical impression and coordination of care.   LOS: 2 days   Vassie LollMadera, Ahley Bulls  [facility] Pager [phone]  03/26/2016, 6:20 PM

## 2016-03-26 NOTE — Consult Note (Signed)
Name: Julie Page MRN: [id number] DOB: 03-05-1951    ADMISSION DATE:  03/24/2016 CONSULTATION DATE:  03/25/16  REFERRING MD :  Dr. Darnelle Catalan   CHIEF COMPLAINT:  Cough, SOB    HISTORY OF PRESENT ILLNESS:  65 year old female with PMH of never smoker, Raynaud's, high cholesterol and suspected history of ILD who presented to ED on 4/6 with complaints progressive shortness of breath, cough, intermittent fevers for 5 days. Additionally she reported influenza like illness 2 weeks ago with 102.5 fevers for one week, then improved but over last weekend, with fever of 101.4. She was seen by her PCP on 4/5 and treated with rocephin IM and placed on levaquin and duoneb. She went home however felt worse the next day, went back to her PCP and then was found to be hypoxic and tachycardic and sent to ED.  It appears she was last seen by Dr. Delton Coombes 03/2012 for workup and probable diagnosis of ILD. She had a positive RF and negative auto-immune workup at that time. She was scheduled for a VATS per Dr. Tyrone Sage, however patient wanted to see rheumatology before proceeding. Unfortunately, she has not followed through and has intermittently been seen by her PCP for intermittent cough, fever, and fatigue. Patient reports she did research regarding VATS biopsy and felt it wasn't needed.  Upon admission patient is evaluated and seen to have heart rate of 114, respirations 22, O2 saturations 90%. Initial lab work revealed a WBC of 11, hemoglobin at 10.7, lactic acid 1.81. Chest x-ray showing diffuse interstitial prominence throughout both lungs reflecting chronic interstitial lung disease. She was placed on Rocephin and azithromycin IV in the ED. Admitted by [facility]. While inpatient, she has been hesitant to use prednisone while inpatient as she is concerned about the side effects. At baseline she does not take any medications. She also reports that she uses homeopathic medications (Natures Sunshine) for lung  and adrenal health through her naturopathic practitioner [name].    SUBJECTIVE:   Diaphoretic and insomnia on steroids. Denies chest pain, fever. Little sputum or cough.  VITAL SIGNS: Temp:  [98 F (36.7 C)] 98 F (36.7 C) (04/08 0616) Pulse Rate:  [80-112] 80 (04/08 0648) Resp:  [18-22] 18 (04/08 0616) BP: (130-169)/(61-85) 158/80 mmHg (04/08 0648) SpO2:  [94 %-98 %] 97 % (04/08 0952)  PHYSICAL EXAMINATION: General: Well-developed adult female in no acute distress Neuro:  AAO 4, speech clear, MAE PSY:  Poor eye contact, flat affect HEENT:  MM pink/moist, good dentition, multiple fillings, no JVD or LAN Cardiovascular:  S1-S2 RRR, no M/R/G Lungs:  Even/nonlabored on 6 L per nasal cannula, lungs bilaterally with Velcro crackles approximately 1/2 to2/3 of the way up Abdomen:  Soft/nontender, bowel sounds 4 active Musculoskeletal:  No acute deformities Skin:  Warm/dry, no edema, large telangiectasia noted on anterior chest wall   Recent Labs Lab 03/24/16 1511 03/25/16 0502  NA 139 140  K 3.6 3.6  CL 104 110  CO2 22 19*  BUN 11 8  CREATININE 0.53 0.48  GLUCOSE 99 90    Recent Labs Lab 03/24/16 1511 03/25/16 0502  HGB 10.7* 10.2*  HCT 31.7* 30.8*  WBC 11.0* 10.1  PLT 366 338   No results found.   SIGNIFICANT EVENTS  4/06  admit with cough and shortness of breath, chest x-ray concerning for progression of ILD 4/07  PCCM consulted  STUDIES:  03/22/2012 CT Chest >> mildly progressive peripheral reticular interstitial accentuation w/mild associated bronchiectasis 03/25/2016 CXR  2 View >> Severe diffuse interstitial throughout the lungs, worse since prior study, likely reflecting underlying chronic interstitial lung disease; cannot exlude superimposed interstitial pneumonia. 03/25/2016  HR CT Chest >>  CULTURES:  BCx2 4/6 >>  Sputum 4/6 >>   ABX:  Azithromycin 4/6 >> Ceftriaxone 4/6 >>  ASSESSMENT / PLAN:  Carried over from 4/7  assessment:  1. Concern for ILD - chest x-ray on admission demonstrates concern for progressive ILD compared to 2013. The patient is known to have positive RA/Sjogren's workup with rheumatology in the past (08/08/2011 RF of 24, see Dr. Kavin LeechByrum"s note from 4/ 22/2013).  She previously has had normal PFTs.  Chart review reflects she has had multiple prior bouts of CAP.  She denies aspiration/difficulty swallowing.  Differential diagnosis includes ILD flare in the setting of suspected autoimmune disease, viral pneumonitis, MAC, and CHF.  Assess echo, BNP and high resolution CT of the chest with inspiratory and expiratory cuts.  Continue antibiotics and prednisone therapy. 2. Hypoxic Respiratory Failure - in setting of progression of suspected interstitial lung disease.  Continue oxygen therapy for goal saturations greater than 92%.  Institute pulmonary hygiene.  Will need ambulatory assessment of oxygen needs prior to discharge.  GLOBAL:  The patient appears to have a degree of denial regarding possible diagnosis / resistance to traditional medical therapies.  She has decided to not follow up in the past regarding further work up and seek naturopathic approach.  This may be a Gervais to managing disease process.   03/26/16- This may be acute flare of interstitial lung disease- Hamman-Rich, in which case prognosis is quite poor. Plan- We are changing to high dose IV solumedrol as discussed with patient and family at bedside today. She asks help for insomnia on steroids.- giving Thomasene Mohairambien  CD Darrin Koman, MD [facility] Pgr:[phone] or if no answer [phone] 03/26/2016, 1:11 PM

## 2016-03-26 NOTE — Care Management Note (Addendum)
Case Management Note  Patient Details  Name: Marlou PorchCarolyn L Yonts MRN: [id number] Date of Birth: 12/06/1951  Subjective/Objective:                  Community acquired pneumonia Interstitial lung disease [facility](HCC)  Action/Plan: CM spoke with patient, Genevie CheshireBilly (son) and daughter-in-law. Family has questions about applying for Medicaid for secondary coverage. Patient has not looked into to paying for secondary insurance and would possibly be able to afford it depending on the cost. They have concerns about the patient returning home to her current living environment. They think there is mold in the house. One of her son's lives with her. There are stairs in the home the family is concerned about her being able to get up and down. Concerned about whether she needs to go to rehab or home at discharge with for assistance. CM explained that if home health services are ordered, CM will arrange the services prior to discharge. Discussed the option of the member being discharged to her [place]'s [place] or another son in the area if they are not comfortable with her returning to her current home. Explained CM can arrange home care services for whichever home she resides in at discharge. Discussed a CSW referral for Medicaid questions. Daughter-in-law asked about a POA. A Spiritual Care Referral is pending.  CM will continue to follow for discharge needs.   Expected Discharge Date:                  Expected Discharge Plan:  Home/Self Care  In-House Referral:  Clinical Social Work  Discharge planning Services  CM Consult  Post Acute Care Choice:    Choice offered to:     DME Arranged:    DME Agency:     HH Arranged:    HH Agency:     Status of Service:  In process, will continue to follow  Medicare Important Message Given:    Date Medicare IM Given:    Medicare IM give by:    Date Additional Medicare IM Given:    Additional Medicare Important Message give by:     If discussed at Long Length of  Stay Meetings, dates discussed:    Additional Comments:  Antony HasteBennett, Merrilyn Legler Harris, RN 03/26/2016, 2:31 PM

## 2016-03-26 NOTE — Progress Notes (Signed)
Received report from HuttonKristin and agree with assessment.

## 2016-03-26 NOTE — Progress Notes (Signed)
Echocardiogram 2D Echocardiogram has been performed.  Julie Page 03/26/2016, 3:28 PM

## 2016-03-27 DIAGNOSIS — I5032 Chronic diastolic (congestive) heart failure: Secondary | ICD-10-CM

## 2016-03-27 DIAGNOSIS — I272 Other secondary pulmonary hypertension: Secondary | ICD-10-CM

## 2016-03-27 DIAGNOSIS — G4700 Insomnia, unspecified: Secondary | ICD-10-CM

## 2016-03-27 MED ORDER — ISOSORB DINITRATE-HYDRALAZINE 20-37.5 MG PO TABS
1.0000 | ORAL_TABLET | Freq: Two times a day (BID) | ORAL | Status: DC
  Administered 2016-03-27 – 2016-03-31 (×8): 1 via ORAL
  Filled 2016-03-27 (×8): qty 1

## 2016-03-27 MED ORDER — ZOLPIDEM TARTRATE 5 MG PO TABS
2.5000 mg | ORAL_TABLET | Freq: Every evening | ORAL | Status: DC | PRN
  Administered 2016-03-27 – 2016-03-30 (×4): 2.5 mg via ORAL
  Filled 2016-03-27 (×4): qty 1

## 2016-03-27 MED ORDER — ENSURE ENLIVE PO LIQD
237.0000 mL | Freq: Three times a day (TID) | ORAL | Status: DC
  Administered 2016-03-28 (×2): 237 mL via ORAL

## 2016-03-27 MED ORDER — SALINE SPRAY 0.65 % NA SOLN
1.0000 | NASAL | Status: DC | PRN
  Filled 2016-03-27: qty 44

## 2016-03-27 NOTE — Care Management Important Message (Signed)
Important Message  Patient Details  Name: Julie Page MRN: [id number] Date of Birth: 08/03/1951   Medicare Important Message Given:  Yes    Antony HasteBennett, Kazuto Sevey Harris, RN 03/27/2016, 10:28 AM

## 2016-03-27 NOTE — Progress Notes (Signed)
Pt's sons expressed concerns regarding pt's oxygen level throughout the shift. Oxygen running in low 90's - 92-93% much of the time on 6L [place]. Near end of the shift, pt expressed that she was tired of breathing so hard. This RN called Respiratory Therapist and passed on to the dayshift RN to call them after their report was finished so the pt could get a breathing treatment.

## 2016-03-27 NOTE — Progress Notes (Signed)
[place] ret to pt to deliver AD copy and explain and answer questions of materials given. Pt's sons had gone on-line for more information. Pt and sons also wanted to know about organ/research donations. [place] instructed them to talk w/nurses/dr about those concerns. CH will refer pt to day [facility] for execution of AD during weekday. Please page if pt has concerns prior to that time. Chaplain Ernest Haber, M.Div.   03/27/16 1800  Clinical Encounter Type  Visited With Patient and family together

## 2016-03-27 NOTE — Progress Notes (Signed)
RT placed HFNC system in PT room- current Sp02 93% on non HFNC.

## 2016-03-27 NOTE — Progress Notes (Signed)
Progress Note   Julie Page:[id number] DOB: 29-Aug-1951 DOA: 03/24/2016 PCP: Abbe Amsterdam, MD   Brief Narrative:   Julie Page is an 65 y.o. 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 Delton Coombes of pulmonology/rheumatology, who was admitted 03/24/16 with a history of flulike symptoms including fever and cough, diagnosed with pneumonia by her PCP and placed on Levaquin, after failing outpatient treatment. Does not take steroids chronically. Apparently, autoimmune work up suggestive of Sjogrens (no symptoms of dry eye/mouth) per patient report.  ANA negative in 2012. RA elevated. Scleroderma Scl-70 not elevated.  Assessment/Plan:   Principal Problem:   CAP (community acquired pneumonia) with SIRS Chest x-ray shows interstitial changes that have worsened. There were some concern for sepsis on admission, but there is no evidence of end organ damage to suggest sepsis.  Strep pneumonia negative and legionella also neg. Outpatient influenza studies negative. Continue supportive care, treatment for ILD flare as mentioned below and current antibiotic therapy  Active Problems:   Interstitial lung disease (HCC) -Will follow pulmonary service rec's -Plan is for high resolution CT, 2-D echo demonstrating grade 2 diastolic HF; preserved EF and normal WM. Moderate pulmonary HTN suggested base on RV systolic pressure  -continue high dose steroids -Will continue current antibiotics, Pulmicort, Brovana and bronchodilators. -Continue oxygen supplementation     Normocytic Anemia Likely anemia of chronic disease. Will monitor Hgb trend  Insomnia -will continue ambien; but will cut dose in half.  Grade 2 diastolic heart failure -no signs of fluid overload -will control BP -recommend low sodium diet -follow daily weights and strict intake/output.    DVT Prophylaxis Lovenox ordered.   Family  Communication/Anticipated D/C date and plan/Code Status   Family Communication: whole family at bedside. Disposition Plan/date: Home when respiratory status stable. Code Status: Full code.   Procedures and diagnostic studies:   Dg Chest 2 View  03/23/2016  CLINICAL DATA:  Wheezing, fever EXAM: CHEST  2 VIEW COMPARISON:  10/09/2014 FINDINGS: Diffuse interstitial prominence throughout the lungs, worsened since prior study. Heart is normal size. No effusions. No acute bony abnormality. IMPRESSION: Severe diffuse interstitial prominence throughout the lungs, worsening since prior study. This likely reflects underlying chronic interstitial lung disease. Cannot exclude superimposed interstitial pneumonia. Electronically Signed   By: Charlett Nose M.D.   On: 03/23/2016 15:24   2-D echo:  - Left ventricle: The cavity size was normal. Systolic function was  normal. The estimated ejection fraction was in the range of 60%  to 65%. Wall motion was normal; there were no regional wall  motion abnormalities. Features are consistent with a pseudonormal  left ventricular filling pattern, with concomitant abnormal  relaxation and increased filling pressure (grade 2 diastolic  dysfunction). - Aortic valve: Mildly calcified annulus. Trileaflet; normal  thickness, mildly calcified leaflets. - Mitral valve: Calcified annulus. There was mild regurgitation. - Left atrium: The atrium was mildly dilated. - Pulmonary arteries: PA peak pressure: 45 mm Hg (S).  Impressions: - The right ventricular systolic pressure was increased consistent  with moderate pulmonary hypertension.  Medical Consultants:    Pulmonology  Anti-Infectives:   Rocephin/azithromycin 03/24/16--->   Subjective:   Julie Page afebrile. No chest pain; breathing uncheanged.  No joint swelling.  No chills. Slept good, but felt droggy from sleeping aid in am.    Objective:    Filed Vitals:   03/27/16 0748 03/27/16  0749 03/27/16 1402 03/27/16 1421  BP:    169/87  Pulse:    109  Temp:    97.9 F (36.6 C)  TempSrc:    Oral  Resp:    22  Height:      Weight:      SpO2: 92% 92% 94% 93%    Intake/Output Summary (Last 24 hours) at 03/27/16 1748 Last data filed at 03/27/16 1548  Gross per 24 hour  Intake   1490 ml  Output   1951 ml  Net   -461 ml   Filed Weights   03/24/16 2055  Weight: 61.281 kg (135 lb 1.6 oz)    Exam: Gen:  Ill appearing, with intermittent episodes of SOB; reports she sleep ok, but felt droggy from sleeping aid in am. Still requiring O2 supplementation. No CP.  Cardiovascular:  Tachy, No M/R/G; per telemetry SR. Respiratory:  Lungs with bibasilar crackles, positive rhonchi and end exp wheezing Gastrointestinal:  Abdomen soft, NT/ND, + BS Extremities:  No C/E/C   Data Reviewed:    Labs: Basic Metabolic Panel:  Recent Labs Lab 03/24/16 1511 03/25/16 0502  NA 139 140  K 3.6 3.6  CL 104 110  CO2 22 19*  GLUCOSE 99 90  BUN 11 8  CREATININE 0.53 0.48  CALCIUM 8.7* 8.2*   GFR Estimated Creatinine Clearance: 58.1 mL/min (by C-G formula based on Cr of 0.48).  CBC:  Recent Labs Lab 03/24/16 1511 03/25/16 0502  WBC 11.0* 10.1  NEUTROABS 8.2* 6.6  HGB 10.7* 10.2*  HCT 31.7* 30.8*  MCV 87.1 89.0  PLT 366 338   Sepsis Labs:  Recent Labs Lab 03/24/16 1511 03/24/16 1713 03/25/16 0502  WBC 11.0*  --  10.1  LATICACIDVEN  --  1.81  --    Microbiology Recent Results (from the past 240 hour(s))  Blood Culture (routine x 2)     Status: None (Preliminary result)   Collection Time: 03/24/16  4:58 PM  Result Value Ref Range Status   Specimen Description BLOOD LEFT ANTECUBITAL  Final   Special Requests BOTTLES DRAWN AEROBIC AND ANAEROBIC 5 ML  Final   Culture   Final    NO GROWTH 3 DAYS [facility]    Report Status PENDING  Incomplete  Blood Culture (routine x 2)     Status: None (Preliminary result)   Collection Time: 03/24/16   5:00 PM  Result Value Ref Range Status   Specimen Description BLOOD RIGHT FOREARM  Final   Special Requests BOTTLES DRAWN AEROBIC AND ANAEROBIC 5 ML  Final   Culture   Final    NO GROWTH 3 DAYS Performed at [facility]    Report Status PENDING  Incomplete     Medications:   . antiseptic oral rinse  7 mL Mouth Rinse BID  . arformoterol  15 mcg Nebulization BID  . budesonide (PULMICORT) nebulizer solution  0.5 mg Nebulization BID  . cefTRIAXone (ROCEPHIN)  IV  1 g Intravenous Q24H  . enoxaparin (LOVENOX) injection  40 mg Subcutaneous Q24H  . feeding supplement (ENSURE ENLIVE)  237 mL Oral TID BM  . guaiFENesin  600 mg Oral BID  . ipratropium-albuterol  3 mL Nebulization TID  . levalbuterol  0.63 mg Nebulization Once  . methylPREDNISolone (SOLU-MEDROL) injection  125 mg Intravenous Q12H   Continuous Infusions: . sodium chloride 50 mL/hr at 03/27/16 1323    Time spent: 35 minutes with > 50% of time dedicated to face to face examination, discussing current diagnostic test  results, clinical impression and coordination of care.   LOS: 3 days   Vassie Loll  [facility] Pager [phone]  03/27/2016, 5:48 PM

## 2016-03-27 NOTE — Progress Notes (Signed)
Pt was sitting up in bed having [facility] Two [facility] were bedside. CH offered to come at a later time. If time allows will take AD to pt to read over prior to completing it w/staff on Monday. Pleas page if support is needed prior to that time. Chaplain Marjory LiesPamela Carrington Holder, M.Div.   03/27/16 1700  Clinical Encounter Type  Visited With Patient and family together

## 2016-03-27 NOTE — Progress Notes (Signed)
RT has [facility] ([facility] Dept). [employer] will retreive HFNC system from [employer] and deliver to [employer] (per [place] from [employer]- approximately 1 hour for delivery). RT caring for PT is aware.

## 2016-03-28 DIAGNOSIS — E440 Moderate protein-calorie malnutrition: Secondary | ICD-10-CM | POA: Insufficient documentation

## 2016-03-28 DIAGNOSIS — J9601 Acute respiratory failure with hypoxia: Secondary | ICD-10-CM

## 2016-03-28 DIAGNOSIS — I272 Pulmonary hypertension, unspecified: Secondary | ICD-10-CM | POA: Insufficient documentation

## 2016-03-28 NOTE — Progress Notes (Addendum)
Progress Note   Julie Page ZOX:[id number] DOB: 11/10/1951 DOA: 03/24/2016 PCP: Abbe AmsterdamOPLAND,JESSICA, MD   Brief Narrative:   Julie PorchCarolyn L Page is an 65 y.o. 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 Delton CoombesByrum of pulmonology/rheumatology, who was admitted 03/24/16 with a history of flulike symptoms including fever and cough, diagnosed with pneumonia by her PCP and placed on Levaquin, after failing outpatient treatment. Does not take steroids chronically. Apparently, autoimmune work up suggestive of Sjogrens (no symptoms of dry eye/mouth) per patient report.  ANA negative in 2012. RA elevated. Scleroderma Scl-70 not elevated.  Assessment/Plan:   Principal Problem:   CAP (community acquired pneumonia) with SIRS Chest x-ray shows interstitial changes that have worsened. There were some concern for sepsis on admission, but there is no evidence of end organ damage to suggest sepsis.  Strep pneumonia negative and legionella also neg. Outpatient influenza studies negative. Continue supportive care, treatment for ILD flare as mentioned below and current antibiotic therapy    Interstitial lung disease (HCC) -Will follow pulmonary service rec's -Plan is for high resolution CT, 2-D echo demonstrating grade 2 diastolic HF; preserved EF and normal WM. Moderate pulmonary HTN suggested base on RV systolic pressure  -continue high dose steroids -Will continue current antibiotics, Pulmicort, Brovana and bronchodilators. -Continue oxygen supplementation     Normocytic Anemia Likely anemia of chronic disease. Will monitor Hgb trend  Insomnia -will continue ambien; but will cut dose in half.  Grade 2 diastolic heart failure -no signs of fluid overload -will control BP -recommend low sodium diet -follow daily weights and strict intake/output.  Elevated BP and moderate pulmonary HTN -will continue tx with BIDIL  moderate protein  calorie malnutrition -follow nutrition service rec's -good nutrition and hydration discussed with patient    DVT Prophylaxis Lovenox ordered.   Family Communication/Anticipated D/C date and plan/Code Status   Family Communication: Son at bedside. Disposition Plan/date: Home when respiratory status stable. Code Status: Full code.   Procedures and diagnostic studies:   Dg Chest 2 View  03/23/2016  CLINICAL DATA:  Wheezing, fever EXAM: CHEST  2 VIEW COMPARISON:  10/09/2014 FINDINGS: Diffuse interstitial prominence throughout the lungs, worsened since prior study. Heart is normal size. No effusions. No acute bony abnormality. IMPRESSION: Severe diffuse interstitial prominence throughout the lungs, worsening since prior study. This likely reflects underlying chronic interstitial lung disease. Cannot exclude superimposed interstitial pneumonia. Electronically Signed   By: Charlett NoseKevin  Dover M.D.   On: 03/23/2016 15:24   Ct Chest High Resolution  03/28/2016  CLINICAL DATA:  Increased shortness of breath and interstitial markings on chest x-ray. EXAM: CT CHEST WITHOUT CONTRAST TECHNIQUE: Multidetector CT imaging of the chest was performed following the standard protocol without intravenous contrast. High resolution imaging of the lungs, as well as inspiratory and expiratory imaging, was performed. COMPARISON:  03/22/2012 and 05/09/2011. FINDINGS: Mediastinum/Lymph Nodes: Mediastinal lymph nodes measure up to 1.9 cm in the lower right paratracheal station, increased from 1.0 cm. Hilar regions are difficult to definitively evaluate without IV contrast. No axillary adenopathy. Atherosclerotic calcification of the arterial vasculature. Heart is at the upper limits of normal in size. No pericardial effusion. Small hiatal hernia. Lungs/Pleura: Subpleural reticulation and traction bronchiectasis/bronchiolectasis appear progressive from 05/09/2011. There is superimposed mid and lower lung zone predominant patchy  ground-glass, new. Trace bilateral pleural effusions. Airway is unremarkable. Minimal air trapping. Upper abdomen: Visualized portions of the liver, gallbladder, adrenal glands, kidneys, spleen  unremarkable. Small hiatal hernia. Musculoskeletal: No worrisome lytic or sclerotic lesions. IMPRESSION: 1. Basilar predominant patchy ground-glass may be due to edema or pneumonia. 2. Subpleural reticulation traction bronchiectasis/bronchiolectasis, progressive from 05/09/2011, indicative of usual interstitial pneumonitis. 3. Tiny bilateral effusions. 4. Mediastinal adenopathy is likely reactive. Electronically Signed   By: Leanna Battles M.D.   On: 03/28/2016 08:14   2-D echo:  - Left ventricle: The cavity size was normal. Systolic function was  normal. The estimated ejection fraction was in the range of 60%  to 65%. Wall motion was normal; there were no regional wall  motion abnormalities. Features are consistent with a pseudonormal  left ventricular filling pattern, with concomitant abnormal  relaxation and increased filling pressure (grade 2 diastolic  dysfunction). - Aortic valve: Mildly calcified annulus. Trileaflet; normal  thickness, mildly calcified leaflets. - Mitral valve: Calcified annulus. There was mild regurgitation. - Left atrium: The atrium was mildly dilated. - Pulmonary arteries: PA peak pressure: 45 mm Hg (S).  Impressions: - The right ventricular systolic pressure was increased consistent  with moderate pulmonary hypertension.  Medical Consultants:    Pulmonology  Anti-Infectives:   Rocephin/azithromycin 03/24/16--->04/01/16 (to complete 8 days total)   Subjective:   Julie Page afebrile. No chest pain; breathing uncheanged.  No joint swelling.  No chills. Slept good and with adjusted ambien dose no droggy effect in am.    Objective:    Filed Vitals:   03/28/16 0630 03/28/16 0806 03/28/16 1352 03/28/16 1450  BP:    143/71  Pulse:  87 92 94  Temp:     97.9 F (36.6 C)  TempSrc:    Oral  Resp:  Height:      Weight:      SpO2: 93% 92% 98% 97%    Intake/Output Summary (Last 24 hours) at 03/28/16 1855 Last data filed at 03/28/16 1451  Gross per 24 hour  Intake    360 ml  Output   1100 ml  Net   -740 ml   Filed Weights   03/24/16 2055  Weight: 61.281 kg (135 lb 1.6 oz)    Exam: Gen:  Ill appearing, with intermittent episodes of SOB; reports she slept good and no droggy effect with adjusted ambien. Still requiring O2 supplementation. No CP.  Cardiovascular:  Tachy, No M/R/G; per telemetry SR. Respiratory:  Lungs with decrease Bs at bases, positive rhonchi and end exp wheezing Gastrointestinal:  Abdomen soft, NT/ND, + BS Extremities:  No C/E/C   Data Reviewed:    Labs: Basic Metabolic Panel:  Recent Labs Lab 03/24/16 1511 03/25/16 0502  NA 139 140  K 3.6 3.6  CL 104 110  CO2 22 19*  GLUCOSE 99 90  BUN 11 8  CREATININE 0.53 0.48  CALCIUM 8.7* 8.2*   GFR Estimated Creatinine Clearance: 58.1 mL/min (by C-G formula based on Cr of 0.48).  CBC:  Recent Labs Lab 03/24/16 1511 03/25/16 0502  WBC 11.0* 10.1  NEUTROABS 8.2* 6.6  HGB 10.7* 10.2*  HCT 31.7* 30.8*  MCV 87.1 89.0  PLT 366 338   Sepsis Labs:  Recent Labs Lab 03/24/16 1511 03/24/16 1713 03/25/16 0502  WBC 11.0*  --  10.1  LATICACIDVEN  --  1.81  --    Microbiology Recent Results (from the past 240 hour(s))  Blood Culture (routine x 2)     Status: None (Preliminary result)   Collection Time: 03/24/16  4:58 PM  Result Value Ref Range Status   Specimen  Description BLOOD LEFT ANTECUBITAL  Final   Special Requests BOTTLES DRAWN AEROBIC AND ANAEROBIC 5 ML  Final   Culture   Final    NO GROWTH 4 DAYS Performed at [facility]    Report Status PENDING  Incomplete  Blood Culture (routine x 2)     Status: None (Preliminary result)   Collection Time: 03/24/16  5:00 PM  Result Value Ref Range Status   Specimen Description  BLOOD RIGHT FOREARM  Final   Special Requests BOTTLES DRAWN AEROBIC AND ANAEROBIC 5 ML  Final   Culture   Final    NO GROWTH 4 DAYS Performed at [facility]    Report Status PENDING  Incomplete     Medications:   . antiseptic oral rinse  7 mL Mouth Rinse BID  . arformoterol  15 mcg Nebulization BID  . budesonide (PULMICORT) nebulizer solution  0.5 mg Nebulization BID  . cefTRIAXone (ROCEPHIN)  IV  1 g Intravenous Q24H  . enoxaparin (LOVENOX) injection  40 mg Subcutaneous Q24H  . feeding supplement (ENSURE ENLIVE)  237 mL Oral TID BM  . guaiFENesin  600 mg Oral BID  . ipratropium-albuterol  3 mL Nebulization TID  . isosorbide-hydrALAZINE  1 tablet Oral BID  . levalbuterol  0.63 mg Nebulization Once  . methylPREDNISolone (SOLU-MEDROL) injection  125 mg Intravenous Q12H   Continuous Infusions:    Time spent: 35 minutes with > 50% of time dedicated to face to face examination, discussing current diagnostic test results, clinical impression and coordination of care.   LOS: 4 days   Vassie Loll  [facility] Pager [phone]  03/28/2016, 6:55 PM

## 2016-03-28 NOTE — Progress Notes (Signed)
Initial Nutrition Assessment  DOCUMENTATION CODES:   Non-severe (moderate) malnutrition in context of acute illness/injury  INTERVENTION:  - Continue Ensure Enlive TID, each supplement provides 350 kcal and 20 grams of protein - RD will continue to monitor for additional nutrition needs or educational needs  NUTRITION DIAGNOSIS:   Inadequate oral intake related to acute illness, poor appetite as evidenced by per patient/family report.  GOAL:   Patient will meet greater than or equal to 90% of their needs  MONITOR:   PO intake, Supplement acceptance, Weight trends, Labs, Skin, I & O's  REASON FOR ASSESSMENT:   Consult Assessment of nutrition requirement/status  ASSESSMENT:   65 year old female with a past medical history significant for Raynaud's, HLD, and autoimmune lung scarring; who presents with complaints of cough and shortness of breath. She reports having flulike symptoms 2 weeks ago fevers up to 102.38F after her son had the flu prior. When she initially went to go get evaluated she was out of the window to receive Tamiflu and therefore was not tested. She reports initially getting better, but subsequently over the last weekend progressively declined reporting recurrent cough and fever. She reports fevers up to 101.36F. She saw her primary care provider yesterday and was diagnosed with pneumonia and was given a gram of Rocephin in the office, then prescriptions for DuoNeb, and Levaquin. She took one dose in the office and then subsequently a dose this morning, however symptoms did not improve.  Pt seen for consult. BMI indicates overweight status. Per chart review, pt ate 25% of breakfast and 75% of lunch 4/7 and 0% of breakfast and 25% of lunch 4/9. Pt reports that her appetite has been decreased x3 weeks and that during that time she had flu-like symptoms which have since resolved. Pt with notable SOB during discussion. She states that during meals she eats slowly to not  further exacerbate SOB and that she was encouraged by other medical staff members to consume several small meal/day so she is not too full which could lead to increased difficulty with breathing. Pt denies any other issues related to eating including no issues with chewing or swallowing.   Pt has not had nutrition supplements in the past but is appreciative for [facility]ure [facility] order. She states that she likes to consume items that are more natural and have limited added flavors, colors, etc. Talked with pt about Boost Simply which is an all-natural product. Talked with her about the importance of protein intake related to demand with breathing difficulty. Pt and son, who is at bedside, report understanding. Pt does not eat much meat but does enjoy non-meat protein sources. Provided pt's son with coupons for Boost products after d/c.   Son reports that pt was encouraged to consume a low Na diet. Pt has not received education on this in the past and was interested in receiving handouts to assist at home. Provided handouts from the Academy of [employer]etics: "Low-Sodium Nutrition Therapy," "Sodium Content of Foods," and "Sodium-Free Flavoring Tips." Reviewed these handouts with son as pt was getting a breathing treatment.   Pt reports UBW of ~135 lbs and that she recently gained weight before losing weight over the past few months to return to UBW range. Pt unsure of how much weight was gained and subsequently lost. Per chart review, pt has lost 3 lbs (2% body weight) in the past 11 days which is not significant for time frame. Physical assessment to upper body shows mild muscle and fat wasting.  Pt not fully meeting needs PTA. Medications reviewed. Labs reviewed; Ca: 8.2 mg/dL.   Diet Order:  Diet regular Room service appropriate?: Yes; Fluid consistency:: Thin  Skin:  Reviewed, no issues  Last BM:  4/9  Height:   Ht Readings from Last 1 Encounters:  03/24/16 5' (1.524 m)    Weight:    Wt Readings from Last 1 Encounters:  03/24/16 135 lb 1.6 oz (61.281 kg)    Ideal Body Weight:  45.45 kg (kg)  BMI:  Body mass index is 26.38 kg/(m^2).  Estimated Nutritional Needs:   Kcal:  1350-1550  Protein:  70-80 grams  Fluid:  1.5-1.7 L/day  EDUCATION NEEDS:   Education needs addressed     Trenton Gammon, RD, LDN Inpatient Clinical Dietitian Pager # [phone] After hours/weekend pager # [phone]

## 2016-03-28 NOTE — Progress Notes (Signed)
Name: Julie Page MRN: [id number] DOB: [birth year]    ADMISSION DATE:  03/24/2016 CONSULTATION DATE:  03/25/16  REFERRING MD :  Dr. Darnelle Catalan   CHIEF COMPLAINT:  Cough, SOB    HISTORY OF PRESENT ILLNESS:  65 year old female with PMH of never smoker, Raynaud's, high cholesterol and suspected history of ILD who presented to ED on 4/6 with complaints progressive shortness of breath, cough, intermittent fevers for 5 days. Additionally she reported influenza like illness 2 weeks ago with 102.5 fevers for one week, then improved but over last weekend, with fever of 101.4. She was seen by her PCP on 4/5 and treated with rocephin IM and placed on levaquin and duoneb. She went home however felt worse the next day, went back to her PCP and then was found to be hypoxic and tachycardic and sent to ED.  It appears she was last seen by Dr. Delton Coombes 03/2012 for workup and probable diagnosis of ILD. She had a positive RF and negative auto-immune workup at that time. She was scheduled for a VATS per Dr. Tyrone Sage, however patient wanted to see rheumatology before proceeding. Unfortunately, she has not followed through and has intermittently been seen by her PCP for intermittent cough, fever, and fatigue. Patient reports she did research regarding VATS biopsy and felt it wasn't needed.  Upon admission patient is evaluated and seen to have heart rate of 114, respirations 22, O2 saturations 90%. Initial lab work revealed a WBC of 11, hemoglobin at 10.7, lactic acid 1.81. Chest x-ray showing diffuse interstitial prominence throughout both lungs reflecting chronic interstitial lung disease. She was placed on Rocephin and azithromycin IV in the ED. Admitted by [facility]. While inpatient, she has been hesitant to use prednisone while inpatient as she is concerned about the side effects. At baseline she does not take any medications. She also reports that she uses homeopathic medications (Natures Sunshine) for lung  and adrenal health through her naturopathic practitioner [name].    SUBJECTIVE:    Unable to state whether she is improved after starting steroids but may be marginally better On 6 L nasal cannula Afebrile No chest pain, remains dyspneic on minimal exertion   VITAL SIGNS: Temp:  [97.8 F (36.6 C)-98.2 F (36.8 C)] 98.2 F (36.8 C) (04/10 0615) Pulse Rate:  [87-109] 87 (04/10 0806) Resp:  [16-22] 20 (04/10 0806) BP: (168-170)/(86-94) 168/86 mmHg (04/10 0615) SpO2:  [92 %-95 %] 92 % (04/10 0806)  PHYSICAL EXAMINATION: General: Well-developed adult female in no acute distress Neuro:  AAO 4, speech clear, MAE PSY:  Poor eye contact, flat affect HEENT:  MM pink/moist, good dentition, multiple fillings, no JVD or LAN Cardiovascular:  S1-S2 RRR, no M/R/G Lungs:  Even/nonlabored on 6 L per nasal cannula, lungs bilaterally with Velcro crackles approximately 1/2 to2/3 of the way up Abdomen:  Soft/nontender, bowel sounds 4 active Musculoskeletal:  No acute deformities Skin:  Warm/dry, no edema, large telangiectasia noted on anterior chest wall   Recent Labs Lab 03/24/16 1511 03/25/16 0502  NA 139 140  K 3.6 3.6  CL 104 110  CO2 22 19*  BUN 11 8  CREATININE 0.53 0.48  GLUCOSE 99 90    Recent Labs Lab 03/24/16 1511 03/25/16 0502  HGB 10.7* 10.2*  HCT 31.7* 30.8*  WBC 11.0* 10.1  PLT 366 338   No results found.   SIGNIFICANT EVENTS  4/06  admit with cough and shortness of breath, chest x-ray concerning for progression of ILD 4/07  PCCM consulted  STUDIES:  03/22/2012 CT Chest >> mildly progressive peripheral reticular interstitial accentuation w/mild associated bronchiectasis 03/25/2016 CXR 2 View >> Severe diffuse interstitial throughout the lungs, worse since prior study, likely reflecting underlying chronic interstitial lung disease; cannot exlude superimposed interstitial pneumonia. 03/25/2016  HR CT Chest >> Subpleural reticulation traction  bronchiectasis/bronchiolectasis, progressive from 05/09/2011, indicative of usual interstitial pneumonitis.  CULTURES:  BCx2 4/6 >> ng Sputum 4/6 >>   ABX:  Azithromycin 4/6 >>4/10 Ceftriaxone 4/6 >>  ASSESSMENT / PLAN:  Carried over from 4/7 assessment:  Progressive ILD compared to 2013. The patient is known to have positive RA/Sjogren's workup with rheumatology in the past (08/08/2011 RF of 24, see Dr. Kavin LeechByrum"s note from 4/ 22/2013).  She previously has had normal PFTs.  Chart review reflects she has had multiple prior bouts of CAP.  She denies aspiration/difficulty swallowing.  Differential diagnosis includes ILD flare in the setting of suspected autoimmune disease, viral pneumonitis, MAC, and CHF.    Acute Hypoxic Respiratory Failure - in setting of progression of suspected interstitial lung disease.  Continue oxygen therapy for goal saturations greater than 92%.    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 Tonny BollmanFCCP. [facility] Pager [phone] If no response call [phone]   03/28/2016     03/28/2016, 11:48 AM

## 2016-03-29 DIAGNOSIS — I5032 Chronic diastolic (congestive) heart failure: Secondary | ICD-10-CM | POA: Insufficient documentation

## 2016-03-29 LAB — BASIC METABOLIC PANEL
ANION GAP: 10 (ref 5–15)
BUN: 23 mg/dL — ABNORMAL HIGH (ref 6–20)
CO2: 26 mmol/L (ref 22–32)
Calcium: 8.4 mg/dL — ABNORMAL LOW (ref 8.9–10.3)
Chloride: 108 mmol/L (ref 101–111)
Creatinine, Ser: 0.44 mg/dL (ref 0.44–1.00)
GFR calc Af Amer: >60 mL/min (ref >60)
Glucose, Bld: 132 mg/dL — ABNORMAL HIGH (ref 65–99)
POTASSIUM: 3.1 mmol/L — AB (ref 3.5–5.1)
SODIUM: 144 mmol/L (ref 135–145)

## 2016-03-29 LAB — EXTRACTABLE NUCLEAR ANTIGEN ANTIBODY
ENA SM Ab Ser-aCnc: <0.2 AI (ref 0.0–0.9)
Ribonucleic Protein: <0.2 AI (ref 0.0–0.9)
SSA (Ro) (ENA) Antibody, IgG: 1 AI — ABNORMAL HIGH (ref 0.0–0.9)
SSB (La) (ENA) Antibody, IgG: <0.2 AI (ref 0.0–0.9)
Scleroderma (Scl-70) (ENA) Antibody, IgG: <0.2 AI (ref 0.0–0.9)

## 2016-03-29 LAB — CULTURE, BLOOD (ROUTINE X 2)
Culture: NO GROWTH
Culture: NO GROWTH

## 2016-03-29 MED ORDER — IBUPROFEN 200 MG PO TABS
400.0000 mg | ORAL_TABLET | Freq: Four times a day (QID) | ORAL | Status: DC | PRN
  Administered 2016-03-29 – 2016-03-31 (×5): 400 mg via ORAL
  Filled 2016-03-29 (×5): qty 2

## 2016-03-29 MED ORDER — PREDNISONE 50 MG PO TABS
60.0000 mg | ORAL_TABLET | Freq: Every day | ORAL | Status: DC
  Administered 2016-03-30 – 2016-03-31 (×2): 60 mg via ORAL
  Filled 2016-03-29 (×2): qty 1

## 2016-03-29 MED ORDER — POTASSIUM CHLORIDE CRYS ER 20 MEQ PO TBCR
40.0000 meq | EXTENDED_RELEASE_TABLET | ORAL | Status: AC
  Administered 2016-03-29 (×2): 40 meq via ORAL
  Filled 2016-03-29 (×2): qty 2

## 2016-03-29 NOTE — Evaluation (Signed)
Physical Therapy Evaluation Patient Details Name: Julie PorchCarolyn L Coppolino MRN: [id number] DOB: 09/04/1951 Today's Date: 03/29/2016   History of Present Illness  65 year old female with PMH Raynaud's, high cholesterol and suspected history of ILD who presented to ED on 4/6 with complaints progressive shortness of breath and admitted with CAP with SIRS and ILD with pulmonary/critical following  Clinical Impression  Pt admitted with above diagnosis. Pt currently with functional limitations due to the deficits listed below (see PT Problem List).  Pt will benefit from skilled PT to increase their independence and safety with mobility to allow discharge to the venue listed below.   Pt with moderate dyspnea during ambulation and required seated rest break.  Pt also required supplemental oxygen.  One of pt's sons present and uncertain of 24/7 assist at home.  If pt is able to have more assist at home then recommend HHPT.  Pt would not likely be able to have transport to outpatient rehab at this time.  If assist not available at home then SNF.  Pt's SpO2 dropped to 86% on 3L O2 so increased to 4L O2 for ambulation and SpO2 improved to 90s.     Follow Up Recommendations Home health PT;SNF;Supervision/Assistance - 24 hour    Equipment Recommendations  Rolling walker with 5" wheels    Recommendations for Other Services       Precautions / Restrictions Precautions Precautions: Fall Precaution Comments: monitor sats      Mobility  Bed Mobility Overal bed mobility: Modified Independent             General bed mobility comments: increased time  Transfers Overall transfer level: Needs assistance Equipment used: None Transfers: Sit to/from Stand Sit to Stand: Min guard         General transfer comment: min/guard for safety  Ambulation/Gait Ambulation/Gait assistance: Min assist Ambulation Distance (Feet): 40 Feet (x2) Assistive device: None Gait Pattern/deviations: Step-through  pattern;Decreased stride length;Drifts right/left     General Gait Details: occasionally unsteady gait requiring assist, required seated rest break, HR 126 and SpO2 dropped to 86% on 3L O2 so increased to 4L and SPO2 in 90s  Stairs            Wheelchair Mobility    Modified Rankin (Stroke Patients Only)       Balance Overall balance assessment: Needs assistance         Standing balance support: No upper extremity supported Standing balance-Leahy Scale: Fair                               Pertinent Vitals/Pain Pain Assessment: No/denies pain    Home Living Family/patient expects to be discharged to:: Private residence Living Arrangements: Children (son)   Type of Home: House       Home Layout: Two level;Able to live on main level with bedroom/bathroom Home Equipment: None      Prior Function Level of Independence: Independent               Hand Dominance        Extremity/Trunk Assessment               Lower Extremity Assessment: Generalized weakness         Communication   Communication: No difficulties  Cognition Arousal/Alertness: Awake/alert Behavior During Therapy: WFL for tasks assessed/performed Overall Cognitive Status: Within Functional Limits for tasks assessed  General Comments      Exercises        Assessment/Plan    PT Assessment Patient needs continued PT services  PT Diagnosis Difficulty walking   PT Problem List Decreased strength;Decreased activity tolerance;Decreased mobility;Decreased balance;Decreased knowledge of use of DME;Cardiopulmonary status limiting activity  PT Treatment Interventions DME instruction;Gait training;Functional mobility training;Patient/family education;Therapeutic exercise;Therapeutic activities   PT Goals (Current goals can be found in the Care Plan section) Acute Rehab PT Goals PT Goal Formulation: With patient Time For Goal Achievement:  04/05/16 Potential to Achieve Goals: Good    Frequency Min 3X/week   Barriers to discharge        Co-evaluation               End of Session Equipment Utilized During Treatment: Gait belt;Oxygen Activity Tolerance: Patient limited by fatigue Patient left: in bed;with call bell/phone within reach;with family/visitor present Nurse Communication: Mobility status         Time: 1610-9604 PT Time Calculation (min) (ACUTE ONLY): 26 min   Charges:   PT Evaluation $PT Eval Moderate Complexity: 1 Procedure PT Treatments $Gait Training: 8-22 mins   PT G Codes:        Elige Shouse,KATHrine E 03/29/2016, 3:55 PM Zenovia Jarred, PT, DPT 03/29/2016 Pager: [phone]

## 2016-03-29 NOTE — Progress Notes (Signed)
Name: Julie Page MRN: [id number] DOB: [birth year]    ADMISSION DATE:  03/24/2016 CONSULTATION DATE:  03/25/16  REFERRING MD :  Dr. Darnelle Catalan   CHIEF COMPLAINT:  Cough, SOB    HISTORY OF PRESENT ILLNESS:  65 year old female with PMH of never smoker, Raynaud's, high cholesterol and suspected history of ILD who presented to ED on 4/6 with complaints progressive shortness of breath, cough, intermittent fevers for 5 days. Additionally she reported influenza like illness 2 weeks ago with 102.5 fevers for one week, then improved but over last weekend, with fever of 101.4. She was seen by her PCP on 4/5 and treated with rocephin IM and placed on levaquin and duoneb. She went home however felt worse the next day, went back to her PCP and then was found to be hypoxic and tachycardic and sent to ED.  It appears she was last seen by Dr. Delton Coombes 03/2012 for workup and probable diagnosis of ILD. She had a positive RF and negative auto-immune workup at that time. She was scheduled for a VATS per Dr. Tyrone Sage, however patient wanted to see rheumatology before proceeding. Unfortunately, she has not followed through and has intermittently been seen by her PCP for intermittent cough, fever, and fatigue. Patient reports she did research regarding VATS biopsy and felt it wasn't needed.  Upon admission patient is evaluated and seen to have heart rate of 114, respirations 22, O2 saturations 90%. Initial lab work revealed a WBC of 11, hemoglobin at 10.7, lactic acid 1.81. Chest x-ray showing diffuse interstitial prominence throughout both lungs reflecting chronic interstitial lung disease. She was placed on Rocephin and azithromycin IV in the ED. Admitted by [facility]. While inpatient, she has been hesitant to use prednisone while inpatient as she is concerned about the side effects. At baseline she does not take any medications. She also reports that she uses homeopathic medications (Natures Sunshine) for lung  and adrenal health through her naturopathic practitioner [name].    SUBJECTIVE:   Pt feels better in regards to activity tolerance.  O2 weaned to 4L per [place].  Desaturates to mid 80's with activity (improved from previous, she went to 70's at first consult).  Denies significant symptoms from steroids.     VITAL SIGNS: Temp:  [97.9 F (36.6 C)-98.3 F (36.8 C)] 98.2 F (36.8 C) (04/11 0600) Pulse Rate:  [89-96] 89 (04/11 0726) Resp:  [18-24] 18 (04/11 0726) BP: (143-159)/(69-86) 159/86 mmHg (04/11 0600) SpO2:  [91 %-98 %] 96 % (04/11 0726)  PHYSICAL EXAMINATION: General: Well-developed adult female in no acute distress Neuro:  AAO 4, speech clear, MAE PSY:  Poor eye contact, flat affect HEENT:  MM pink/moist, good dentition, multiple fillings, no JVD or LAN Cardiovascular:  S1-S2 RRR, no M/R/G Lungs:  Even/nonlabored on 4L per [place], lungs bilaterally with Velcro crackles approximately 1/2 to2/3 of the way up posteriorly  Abdomen:  Soft/nontender, bowel sounds 4 active Musculoskeletal:  No acute deformities Skin:  Warm/dry, no edema, large telangiectasia noted on anterior chest wall, resolving contact dermatitis (from Vicks rub)   Recent Labs Lab 03/24/16 1511 03/25/16 0502 03/29/16 0502  NA 139 140 144  K 3.6 3.6 3.1*  CL 104 110 108  CO2 22 19* 26  BUN 11 8 23*  CREATININE 0.53 0.48 0.44  GLUCOSE 99 90 132*    Recent Labs Lab 03/24/16 1511 03/25/16 0502  HGB 10.7* 10.2*  HCT 31.7* 30.8*  WBC 11.0* 10.1  PLT 366 338  No results found.   SIGNIFICANT EVENTS  4/06  admit with cough and shortness of breath, chest x-ray concerning for progression of ILD 4/07  PCCM consulted  STUDIES:  03/22/2012 CT Chest >> mildly progressive peripheral reticular interstitial accentuation w/mild associated bronchiectasis 03/25/2016 CXR 2 View >> Severe diffuse interstitial throughout the lungs, worse since prior study, likely reflecting underlying chronic interstitial lung  disease; cannot exlude superimposed interstitial pneumonia. 03/25/2016  HR CT Chest >> Subpleural reticulation traction bronchiectasis/bronchiolectasis, progressive from 05/09/2011, indicative of usual interstitial pneumonitis.  CULTURES:  BCx2 4/6 >> ng Sputum 4/6 >>   ABX:  Azithromycin 4/6 >>4/10 Ceftriaxone 4/6 >> 4/12 (stop date added)  ASSESSMENT / PLAN:  Progressive ILD - compared to 2013. The patient is known to have positive RA/Sjogren's workup with rheumatology in the past (08/08/2011 RF of 24, see Dr. Kavin LeechByrum"s note from 4/ 22/2013).  She previously has had normal PFTs.  Chart review reflects she has had multiple prior bouts of "CAP".  She denies aspiration/difficulty swallowing.  Differential diagnosis includes ILD flare in the setting of suspected autoimmune disease, viral pneumonitis, MAC, and CHF.     Acute Hypoxic Respiratory Failure - in setting of progression of suspected interstitial lung disease.  Continue oxygen therapy for goal saturations greater than 92%.    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 Marchelle Gearingamaswamy (see d/c section) Consider taper by 10 mg Q 2 weeks at discharge of prednisone (60 mg x 2 weeks, then 50 mg x2 weeks etc) It is possible that she had a viral infection superimposed that she contracted from her son-hope is that at least that would improve as would her hypoxia over next few days Will need O2 eval prior to discharge for ambulatory needs  Push PT efforts  Consider outpatient pulmonary rehab   Discussed in detail with the patient and her sons at bedside.  She may need early referral to transplant center.     Canary BrimBrandi Annastasia Haskins, NP-C [facility] Pgr: [phone] or if no answer [phone] 03/29/2016, 11:27 AM

## 2016-03-29 NOTE — Progress Notes (Signed)
Progress Note   Julie Page ZOX:[id number] DOB: December 28, 1950 DOA: 03/24/2016 PCP: Abbe Amsterdam, MD   Brief Narrative:   Julie Page is an 65 y.o. 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 Delton Coombes of pulmonology/rheumatology, who was admitted 03/24/16 with a history of flulike symptoms including fever and cough, diagnosed with pneumonia by her PCP and placed on Levaquin, after failing outpatient treatment. Does not take steroids chronically. Apparently, autoimmune work up suggestive of Sjogrens (no symptoms of dry eye/mouth) per patient report.  ANA negative in 2012. RA elevated. Scleroderma Scl-70 not elevated.  Assessment/Plan:   Principal Problem:   CAP (community acquired pneumonia) with SIRS Chest x-ray shows interstitial changes that have worsened. There were some concern for sepsis on admission, but there is no evidence of end organ damage to suggest sepsis.  Strep pneumonia negative and legionella also neg. Outpatient influenza studies negative. Continue supportive care, treatment for ILD flare as mentioned below and current antibiotic therapy    Interstitial lung disease (HCC) -Plan is for high resolution CT, 2-D echo demonstrating grade 2 diastolic HF; preserved EF and normal WM. Moderate pulmonary HTN suggested base on RV systolic pressure  -continue high dose steroids X 1 more day and then follow instructions/recommendation from pulmonary service -Will continue current antibiotics, Pulmicort, Brovana and bronchodilators. -Continue oxygen supplementation     Normocytic Anemia Likely anemia of chronic disease. Will monitor Hgb trend  Insomnia -will continue ambien; 2.5mg  QHS PRN  Grade 2 diastolic heart failure -no signs of fluid overload -will control BP -recommended/advise to follow low sodium diet -follow daily weights and strict intake/output.  Elevated BP and moderate pulmonary  HTN -will continue tx with BIDIL  moderate protein calorie malnutrition -follow nutrition service rec's -good nutrition and hydration discussed with patient    DVT Prophylaxis Lovenox ordered.   Family Communication/Anticipated D/C date and plan/Code Status   Family Communication: Son at bedside. Disposition Plan/date: Home when respiratory status stable vs needs of rehab/conditioning in a facility. Follow PT rec's Code Status: Full code.   Procedures and diagnostic studies:   Dg Chest 2 View  03/23/2016  CLINICAL DATA:  Wheezing, fever EXAM: CHEST  2 VIEW COMPARISON:  10/09/2014 FINDINGS: Diffuse interstitial prominence throughout the lungs, worsened since prior study. Heart is normal size. No effusions. No acute bony abnormality. IMPRESSION: Severe diffuse interstitial prominence throughout the lungs, worsening since prior study. This likely reflects underlying chronic interstitial lung disease. Cannot exclude superimposed interstitial pneumonia. Electronically Signed   By: Charlett Nose M.D.   On: 03/23/2016 15:24   Ct Chest High Resolution  03/28/2016  CLINICAL DATA:  Increased shortness of breath and interstitial markings on chest x-ray. EXAM: CT CHEST WITHOUT CONTRAST TECHNIQUE: Multidetector CT imaging of the chest was performed following the standard protocol without intravenous contrast. High resolution imaging of the lungs, as well as inspiratory and expiratory imaging, was performed. COMPARISON:  03/22/2012 and 05/09/2011. FINDINGS: Mediastinum/Lymph Nodes: Mediastinal lymph nodes measure up to 1.9 cm in the lower right paratracheal station, increased from 1.0 cm. Hilar regions are difficult to definitively evaluate without IV contrast. No axillary adenopathy. Atherosclerotic calcification of the arterial vasculature. Heart is at the upper limits of normal in size. No pericardial effusion. Small hiatal hernia. Lungs/Pleura: Subpleural reticulation and traction  bronchiectasis/bronchiolectasis appear progressive from 05/09/2011. There is superimposed mid and lower lung zone predominant patchy ground-glass, new. Trace bilateral pleural effusions. Airway is unremarkable.  Minimal air trapping. Upper abdomen: Visualized portions of the liver, gallbladder, adrenal glands, kidneys, spleen unremarkable. Small hiatal hernia. Musculoskeletal: No worrisome lytic or sclerotic lesions. IMPRESSION: 1. Basilar predominant patchy ground-glass may be due to edema or pneumonia. 2. Subpleural reticulation traction bronchiectasis/bronchiolectasis, progressive from 05/09/2011, indicative of usual interstitial pneumonitis. 3. Tiny bilateral effusions. 4. Mediastinal adenopathy is likely reactive. Electronically Signed   By: Leanna BattlesMelinda  Blietz M.D.   On: 03/28/2016 08:14   2-D echo:  - Left ventricle: The cavity size was normal. Systolic function was  normal. The estimated ejection fraction was in the range of 60%  to 65%. Wall motion was normal; there were no regional wall  motion abnormalities. Features are consistent with a pseudonormal  left ventricular filling pattern, with concomitant abnormal  relaxation and increased filling pressure (grade 2 diastolic  dysfunction). - Aortic valve: Mildly calcified annulus. Trileaflet; normal  thickness, mildly calcified leaflets. - Mitral valve: Calcified annulus. There was mild regurgitation. - Left atrium: The atrium was mildly dilated. - Pulmonary arteries: PA peak pressure: 45 mm Hg (S).  Impressions: - The right ventricular systolic pressure was increased consistent  with moderate pulmonary hypertension.  Medical Consultants:    Pulmonology  Anti-Infectives:   Rocephin/azithromycin 03/24/16--->04/01/16 (to complete 8 days total)   Subjective:   Julie Page afebrile. No chest pain; patient felt breathing slightly better. No joint swelling.  No chills. Still requiring O2 supplementation and with episodes  of hypoxia with minimal exertion.  Objective:    Filed Vitals:   03/29/16 0600 03/29/16 0726 03/29/16 1120 03/29/16 1337  BP: 159/86     Pulse: 93 89  88  Temp: 98.2 F (36.8 C)     TempSrc: Oral     Resp: 24 18  22   Height:      Weight:      SpO2: 91% 96% 97% 94%    Intake/Output Summary (Last 24 hours) at 03/29/16 1510 Last data filed at 03/29/16 0934  Gross per 24 hour  Intake    100 ml  Output    752 ml  Net   -652 ml   Filed Weights   03/24/16 2055  Weight: 61.281 kg (135 lb 1.6 oz)    Exam: Gen:  Ill appearing, with intermittent episodes of SOB and hypoxia with minimal exertion. Requiring 4L of [place] supplementation. Reports some overall improvement. No CP.  Cardiovascular:  Tachy, No M/R/G; per telemetry SR. Respiratory:  Lungs with decrease Bs at bases, positive rhonchi and end exp wheezing Gastrointestinal:  Abdomen soft, NT/ND, + BS Extremities:  No C/E/C   Data Reviewed:    Labs: Basic Metabolic Panel:  Recent Labs Lab 03/24/16 1511 03/25/16 0502 03/29/16 0502  NA 139 140 144  K 3.6 3.6 3.1*  CL 104 110 108  CO2 22 19* 26  GLUCOSE 99 90 132*  BUN 11 8 23*  CREATININE 0.53 0.48 0.44  CALCIUM 8.7* 8.2* 8.4*   GFR Estimated Creatinine Clearance: 58.1 mL/min (by C-G formula based on Cr of 0.44).  CBC:  Recent Labs Lab 03/24/16 1511 03/25/16 0502  WBC 11.0* 10.1  NEUTROABS 8.2* 6.6  HGB 10.7* 10.2*  HCT 31.7* 30.8*  MCV 87.1 89.0  PLT 366 338   Sepsis Labs:  Recent Labs Lab 03/24/16 1511 03/24/16 1713 03/25/16 0502  WBC 11.0*  --  10.1  LATICACIDVEN  --  1.81  --    Microbiology Recent Results (from the past 240 hour(s))  Blood Culture (routine x 2)  Status: None   Collection Time: 03/24/16  4:58 PM  Result Value Ref Range Status   Specimen Description BLOOD LEFT ANTECUBITAL  Final   Special Requests BOTTLES DRAWN AEROBIC AND ANAEROBIC 5 ML  Final   Culture   Final    NO GROWTH 5 DAYS Performed at [facility]     Report Status 03/29/2016 FINAL  Final  Blood Culture (routine x 2)     Status: None   Collection Time: 03/24/16  5:00 PM  Result Value Ref Range Status   Specimen Description BLOOD RIGHT FOREARM  Final   Special Requests BOTTLES DRAWN AEROBIC AND ANAEROBIC 5 ML  Final   Culture   Final    NO GROWTH 5 DAYS Performed at [facility]    Report Status 03/29/2016 FINAL  Final     Medications:   . antiseptic oral rinse  7 mL Mouth Rinse BID  . arformoterol  15 mcg Nebulization BID  . budesonide (PULMICORT) nebulizer solution  0.5 mg Nebulization BID  . cefTRIAXone (ROCEPHIN)  IV  1 g Intravenous Q24H  . enoxaparin (LOVENOX) injection  40 mg Subcutaneous Q24H  . feeding supplement (ENSURE ENLIVE)  237 mL Oral TID BM  . guaiFENesin  600 mg Oral BID  . ipratropium-albuterol  3 mL Nebulization TID  . isosorbide-hydrALAZINE  1 tablet Oral BID  . levalbuterol  0.63 mg Nebulization Once  . methylPREDNISolone (SOLU-MEDROL) injection  125 mg Intravenous Q12H  . [START ON 03/30/2016] predniSONE  60 mg Oral Q breakfast   Continuous Infusions:    Time spent: 35 minutes with > 50% of time dedicated to face to face examination, discussing current diagnostic test results, clinical impression and coordination of care.   LOS: 5 days   Vassie Loll  [facility] Pager [phone]  03/29/2016, 3:10 PM

## 2016-03-30 DIAGNOSIS — J9621 Acute and chronic respiratory failure with hypoxia: Secondary | ICD-10-CM

## 2016-03-30 MED ORDER — ZOLPIDEM TARTRATE 5 MG PO TABS
2.5000 mg | ORAL_TABLET | Freq: Once | ORAL | Status: AC
  Administered 2016-03-30: 2.5 mg via ORAL
  Filled 2016-03-30: qty 1

## 2016-03-30 NOTE — Care Management Important Message (Signed)
Important Message  Patient Details  Name: Marlou PorchCarolyn L Letendre MRN: [id number] Date of Birth: 06/02/1951   Medicare Important Message Given:  Yes    Haskell FlirtJamison, Rube Sanchez 03/30/2016, 9:58 AMImportant Message  Patient Details  Name: Marlou PorchCarolyn L Burleson MRN: [id number] Date of Birth: 11/03/1951   Medicare Important Message Given:  Yes    Haskell FlirtJamison, Renel Ende 03/30/2016, 9:58 AM

## 2016-03-30 NOTE — Progress Notes (Signed)
CSW received phone call from pt son, Julie Page stating that pt family went to tour facilities and [facility].   Pt son states that [facility] and confirmed facility can [facility] tomorrow and was going to go ahead and complete admission paperwork.   [facility] and [facility].   CSW to continue to follow.   Julie Page, MSW, LCSW Clinical Social Work Coverage for Xcel EnergyKelly Page, KentuckyLCSW [phone]

## 2016-03-30 NOTE — NC FL2 (Signed)
[place] MEDICAID FL2 LEVEL OF CARE SCREENING TOOL     IDENTIFICATION  Patient Name: Julie Page Birthdate: [birth year] Sex: female Admission Date (Current Location): 03/24/2016  [facility] and [place] Number:  Producer, television/film/video and Address:  [facility],  [address], [place] [address]      Provider Number: [id number]  Attending Physician Name and Address:  Jeralyn Bennett, MD  Relative Name and Phone Number:       Current Level of Care: Hospital Recommended Level of Care: Skilled Nursing Facility Prior Approval Number:    Date Approved/Denied:   PASRR Number: [id number]  Discharge Plan: SNF    Current Diagnoses: Patient Active Problem List   Diagnosis Date Noted  . Chronic diastolic heart failure (HCC)   . Malnutrition of moderate degree 03/28/2016  . Pulmonary HTN (HCC)   . Community acquired pneumonia   . Anemia 03/25/2016  . SIRS (systemic inflammatory response syndrome) (HCC) 03/25/2016  . Hypoxia   . ILD (interstitial lung disease) (HCC)   . CAP (community acquired pneumonia) 03/24/2016  . Chronic cough 04/09/2012  . Interstitial lung disease (HCC) 06/13/2011    Orientation RESPIRATION BLADDER Height & Weight     Self, Time, Situation, Place  O2 (3L) Continent Weight: 135 lb 1.6 oz (61.281 kg) Height:  5' (152.4 cm)  BEHAVIORAL SYMPTOMS/MOOD NEUROLOGICAL BOWEL NUTRITION STATUS      Continent Diet (Regular)  AMBULATORY STATUS COMMUNICATION OF NEEDS Skin   Extensive Assist Verbally Normal                       Personal Care Assistance Level of Assistance  Bathing, Dressing Bathing Assistance: Limited assistance   Dressing Assistance: Limited assistance     Functional Limitations Info             SPECIAL CARE FACTORS FREQUENCY  PT (By licensed PT), OT (By licensed OT)     PT Frequency: 5 OT Frequency: 5            Contractures      Additional Factors Info  Code Status, Allergies Code Status Info:  Fullcode Allergies Info: Azithromycin           Current Medications (03/30/2016):  This is the current hospital active medication list Current Facility-Administered Medications  Medication Dose Route Frequency Provider Last Rate Last Dose  . acetaminophen (TYLENOL) tablet 650 mg  650 mg Oral Q6H PRN Clydie Braun, MD   650 mg at 03/29/16 1255  . antiseptic oral rinse (CPC / CETYLPYRIDINIUM CHLORIDE 0.05%) solution 7 mL  7 mL Mouth Rinse BID Rondell Burtis Junes, MD   7 mL at 03/30/16 0930  . benzonatate (TESSALON) capsule 200 mg  200 mg Oral TID PRN Rolan Lipa, NP   200 mg at 03/30/16 0747  . budesonide (PULMICORT) nebulizer solution 0.5 mg  0.5 mg Nebulization BID Clydie Braun, MD   0.5 mg at 03/30/16 0829  . cefTRIAXone (ROCEPHIN) 1 g in dextrose 5 % 50 mL IVPB  1 g Intravenous Q24H Jeanella Craze, NP   1 g at 03/29/16 1609  . enoxaparin (LOVENOX) injection 40 mg  40 mg Subcutaneous Q24H Clydie Braun, MD   40 mg at 03/29/16 2120  . feeding supplement (ENSURE ENLIVE) (ENSURE ENLIVE) liquid 237 mL  237 mL Oral TID BM Vassie Loll, MD   237 mL at 03/28/16 2037  . guaiFENesin (MUCINEX) 12 hr tablet 600 mg  600 mg  Oral BID Clydie Braunondell A Smith, MD   600 mg at 03/30/16 0930  . ibuprofen (ADVIL,MOTRIN) tablet 400 mg  400 mg Oral Q6H PRN Roma KayserKatherine P Schorr, NP   400 mg at 03/30/16 0747  . ipratropium-albuterol (DUONEB) 0.5-2.5 (3) MG/3ML nebulizer solution 3 mL  3 mL Nebulization Q2H PRN Clydie Braunondell A Smith, MD   3 mL at 03/28/16 0630  . ipratropium-albuterol (DUONEB) 0.5-2.5 (3) MG/3ML nebulizer solution 3 mL  3 mL Nebulization TID Maryruth Bunhristina P Rama, MD   3 mL at 03/30/16 0830  . isosorbide-hydrALAZINE (BIDIL) 20-37.5 MG per tablet 1 tablet  1 tablet Oral BID Vassie Lollarlos Madera, MD   1 tablet at 03/30/16 0930  . levalbuterol (XOPENEX) nebulizer solution 0.63 mg  0.63 mg Nebulization Once Rolan Lipahomas Michael Callahan, NP   0.63 mg at 03/26/16 0335  . predniSONE (DELTASONE) tablet 60 mg  60 mg Oral Q  breakfast Jeanella CrazeBrandi L Ollis, NP   60 mg at 03/30/16 0747  . sodium chloride (OCEAN) 0.65 % nasal spray 1 spray  1 spray Each Nare PRN Rolan Lipahomas Michael Callahan, NP      . zolpidem Remus Loffler(AMBIEN) tablet 2.5 mg  2.5 mg Oral QHS PRN Vassie Lollarlos Madera, MD   2.5 mg at 03/29/16 2122     Discharge Medications: Please see discharge summary for a list of discharge medications.  Relevant Imaging Results:  Relevant Lab Results:   Additional Information SSN: [id number]  Arlyss RepressHarrison, Talea Manges F, LCSW

## 2016-03-30 NOTE — Clinical Social Work Note (Signed)
Clinical Social Work Assessment  Patient Details  Name: Marlou PorchCarolyn L Preslar MRN: [id number] Date of Birth: 05/25/1951  Date of referral:  03/30/16               Reason for consult:  Facility Placement                Permission sought to share information with:  Oceanographeracility Contact Representative Permission granted to share information::  Yes, Verbal Permission Granted  Name::        Agency::     Relationship::     Contact Information:     Housing/Transportation Living arrangements for the past 2 months:  Single Family Home Source of Information:  Patient, Adult Children Patient Interpreter Needed:  None Criminal Activity/Legal Involvement Pertinent to Current Situation/Hospitalization:  No - Comment as needed Significant Relationships:  Adult Children Lives with:  Adult Children Do you feel safe going back to the place where you live?  No Need for family participation in patient care:  Yes (Comment)  Care giving concerns:  CSW reviewed PT evaluation recommending HH vs. SNF.    Social Worker assessment / plan:  CSW spoke with patient & son, Genevie CheshireBilly at bedside re: discharge planning. Patient informed CSW that she lives with her other son, Ian MalkinZach but is open to going to a rehab facility before returning home.   Employment status:  Retired Health and safety inspectornsurance information:  [employer]are PT Recommendations:  Skilled Holiday representativeursing Facility, Home with Home Health Information / Referral to community resources:  Skilled Nursing Facility  Patient/Family's Response to care:  Patient is agreeable to [facility] to see which facilities have availability. [name] daughter-in-law, Revonda Standardllison (Billy's wife) is a physical therapist and had talked to patient about benefits of going to a rehab facility.   Patient/Family's Understanding of and Emotional Response to Diagnosis, Current Treatment, and Prognosis:    Emotional Assessment Appearance:  Appears stated  age Attitude/Demeanor/Rapport:    Affect (typically observed):  Accepting, Pleasant Orientation:  Oriented to Self, Oriented to Place, Oriented to  Time, Oriented to Situation Alcohol / Substance use:    Psych involvement (Current and /or in the community):     Discharge Needs  Concerns to be addressed:    Readmission within the last 30 days:    Current discharge risk:    Barriers to Discharge:      Arlyss RepressHarrison, Tareva Leske F, LCSW 03/30/2016, 10:16 AM

## 2016-03-30 NOTE — Clinical Social Work Placement (Signed)
   CLINICAL SOCIAL WORK PLACEMENT  NOTE  Date:  03/30/2016  Patient Details  Name: Julie Page MRN: [id number] Date of Birth: 04/26/1951  Clinical Social Work is seeking post-discharge placement for this patient at the Skilled  Nursing Facility level of care (*CSW will initial, date and re-position this form in  chart as items are completed):  Yes   Patient/family provided with [facility] Clinical Social Work Department's list of facilities offering this level of care within the geographic area requested by the patient (or if unable, by the patient's family).  Yes   Patient/family informed of their freedom to choose among providers that offer the needed level of care, that participate in Medicare, Medicaid or managed care program needed by the patient, have an available bed and are willing to accept the patient.  Yes   Patient/family informed of [facility]'s ownership interest in [facility] and [facility], as well as of the fact that they are under no obligation to receive care at these facilities.  PASRR submitted to EDS on 03/30/16     PASRR number received on 03/30/16     Existing PASRR number confirmed on       FL2 transmitted to all facilities in geographic area requested by pt/family on 03/30/16     FL2 transmitted to all facilities within larger geographic area on       Patient informed that his/her managed care company has contracts with or will negotiate with certain facilities, including the following:            Patient/family informed of bed offers received.  Patient chooses bed at       Physician recommends and patient chooses bed at      Patient to be transferred to   on  .  Patient to be transferred to facility by       Patient family notified on   of transfer.  Name of family member notified:        PHYSICIAN       Additional Comment:    _______________________________________________ Arlyss RepressHarrison, Catrina Fellenz F, LCSW 03/30/2016, 10:20 AM

## 2016-03-30 NOTE — Progress Notes (Signed)
Name: Julie Page MRN: [id number] DOB: [birth year]    ADMISSION DATE:  03/24/2016 CONSULTATION DATE:  03/25/16  REFERRING MD :  Dr. Darnelle Catalan   CHIEF COMPLAINT:  Cough, SOB    HISTORY OF PRESENT ILLNESS:  65 year old female with PMH of never smoker, Raynaud's, high cholesterol and suspected history of ILD who presented to ED on 4/6 with complaints progressive shortness of breath, cough, intermittent fevers for 5 days. Additionally she reported influenza like illness 2 weeks ago with 102.5 fevers for one week, then improved but over last weekend, with fever of 101.4. She was seen by her PCP on 4/5 and treated with rocephin IM and placed on levaquin and duoneb. She went home however felt worse the next day, went back to her PCP and then was found to be hypoxic and tachycardic and sent to ED.  It appears she was last seen by Dr. Delton Coombes 03/2012 for workup and probable diagnosis of ILD. She had a positive RF and negative auto-immune workup at that time. She was scheduled for a VATS per Dr. Tyrone Sage, however patient wanted to see rheumatology before proceeding. Unfortunately, she has not followed through and has intermittently been seen by her PCP for intermittent cough, fever, and fatigue. Patient reports she did research regarding VATS biopsy and felt it wasn't needed.  Upon admission patient is evaluated and seen to have heart rate of 114, respirations 22, O2 saturations 90%. Initial lab work revealed a WBC of 11, hemoglobin at 10.7, lactic acid 1.81. Chest x-ray showing diffuse interstitial prominence throughout both lungs reflecting chronic interstitial lung disease. She was placed on Rocephin and azithromycin IV in the ED. Admitted by [facility]. While inpatient, she has been hesitant to use prednisone while inpatient as she is concerned about the side effects. At baseline she does not take any medications. She also reports that she uses homeopathic medications (Natures Sunshine) for lung  and adrenal health through her naturopathic practitioner [name].    SUBJECTIVE:   Pt feels better in regards to activity tolerance.  O2 weaned to 3L per [place] at rest, 4L with activity per PT on 4/11.  RT reports O2 weaned to 2L 4/12 with saturations running 94-97% at rest.    VITAL SIGNS: Temp:  [97.7 F (36.5 C)-98 F (36.7 C)] 98 F (36.7 C) (04/12 0615) Pulse Rate:  [84-96] 89 (04/12 0830) Resp:  [20-22] 20 (04/12 0830) BP: (149-157)/(75-80) 154/76 mmHg (04/12 0615) SpO2:  [94 %-98 %] 98 % (04/12 0830)  PHYSICAL EXAMINATION: General: Well-developed adult female in no acute distress sitting in bed Neuro:  AAO 4, speech clear, MAE PSY:  Flat affect, improved eye contact HEENT:  MM pink/moist, good dentition, multiple fillings, no JVD or LAN Cardiovascular:  S1-S2 RRR, no M/R/G Lungs:  Even/nonlabored on 2L per [place], lungs bilaterally with Velcro crackles approximately 1/2 to2/3 of the way up posteriorly  Abdomen:  Soft/nontender, bowel sounds 4 active Musculoskeletal:  No acute deformities Skin:  Warm/dry, no edema, large telangiectasia noted on anterior chest wall, resolving contact dermatitis (from Vicks rub)   Recent Labs Lab 03/24/16 1511 03/25/16 0502 03/29/16 0502  NA 139 140 144  K 3.6 3.6 3.1*  CL 104 110 108  CO2 22 19* 26  BUN 11 8 23*  CREATININE 0.53 0.48 0.44  GLUCOSE 99 90 132*    Recent Labs Lab 03/24/16 1511 03/25/16 0502  HGB 10.7* 10.2*  HCT 31.7* 30.8*  WBC 11.0* 10.1  PLT 366 338  No results found.   SIGNIFICANT EVENTS  4/06  admit with cough and shortness of breath, chest x-ray concerning for progression of ILD 4/07  PCCM consulted  STUDIES:  03/22/2012 CT Chest >> mildly progressive peripheral reticular interstitial accentuation w/mild associated bronchiectasis 03/25/2016 CXR 2 View >> Severe diffuse interstitial throughout the lungs, worse since prior study, likely reflecting underlying chronic interstitial lung disease; cannot  exlude superimposed interstitial pneumonia. 03/25/2016  HR CT Chest >> Subpleural reticulation traction bronchiectasis/bronchiolectasis, progressive from 05/09/2011, indicative of usual interstitial pneumonitis.  CULTURES:  BCx2 4/6 >> negative Sputum 4/6 >> not done / prodcued  ABX:  Azithromycin 4/6 >>4/10 Ceftriaxone 4/6 >> 4/12 (stop date added)  ASSESSMENT / PLAN:  Progressive ILD - compared to 2013. The patient is known to have positive RA/Sjogren's workup with rheumatology in the past (08/08/2011 RF of 24, see Dr. Kavin LeechByrum"s note from 4/ 22/2013).  She previously has had normal PFTs.  Chart review reflects she has had multiple prior bouts of "CAP".  She denies aspiration/difficulty swallowing.  Differential diagnosis includes ILD flare in the setting of suspected autoimmune disease, viral pneumonitis, MAC, and CHF.  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    Acute Hypoxic Respiratory Failure - in setting of progression of suspected interstitial lung disease.  Continue oxygen therapy for goal saturations greater than 92%.    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 Marchelle Gearingamaswamy (see d/c section) Prednisone taper 60 mg x1 week, then 50 mg QD x 2 weeks, then 40 mg QD until seen by Dr. Marchelle Gearingamaswamy It is possible that she had a viral infection superimposed that she contracted from her son-hope is that at least that would improve as would her hypoxia over next few days Will need O2 at discharge >> Recommend 2L at rest, 4L with exertion Push PT efforts / rehab Consider outpatient pulmonary rehab   Discussed in detail with the patient and her sons at bedside.  She may need early referral to  transplant center.     Canary BrimBrandi Colisha Redler, NP-C [facility] Pgr: [phone] or if no answer [phone] 03/30/2016, 9:40 AM

## 2016-03-30 NOTE — Progress Notes (Signed)
Progress Note   JLEE HARKLESS ZOX:[id number] DOB: 23-May-1951 DOA: 03/24/2016 PCP: Abbe Amsterdam, MD   Brief Narrative:   Julie Page is an 65 y.o. 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 Delton Coombes of pulmonology/rheumatology, who was admitted 03/24/16 with a history of flulike symptoms including fever and cough, diagnosed with pneumonia by her PCP and placed on Levaquin, after failing outpatient treatment. Does not take steroids chronically. Apparently, autoimmune work up suggestive of Sjogrens (no symptoms of dry eye/mouth) per patient report.  ANA negative in 2012. RA elevated. Scleroderma Scl-70 not elevated.  Assessment/Plan:   Principal Problem:  Possible CAP (community acquired pneumonia)  -On 03/25/2016 she had a high resolution CT scan of lungs that revealed patchy groundglass opacities at bilateral bases that could represent pneumonia or edema. Radiology also noted subpleural circulation traction bronchiectasis bronchiolectasis progressed from previous study from 05/09/2011. Symptoms indicative of interstitial pneumonitis -She remains on empiric IV antibiotic therapy with ceftriaxone gram IV every 24 hours    Interstitial lung disease (HCC) -Plan is for high resolution CT, 2-D echo demonstrating grade 2 diastolic HF; preserved EF and normal WM. Moderate pulmonary HTN suggested base on RV systolic pressure  -Pulmonary critical care medicine following, recommended slow steroid taper. On 03/30/2016 she was switched to prednisone 60 mg by mouth daily x 1 week, with gradual weekly taper stay at 40 mg until seen by pulmonary medicine at the office. -Continue supplemental oxygen, which has been weaned down from 6 L to 2 L nasal cannula    Normocytic Anemia Likely anemia of chronic disease. Will monitor Hgb trend  Insomnia -will continue ambien; 2.5mg  QHS PRN  Grade 2 diastolic heart failure -no signs of  fluid overload -will control BP -recommended/advise to follow low sodium diet -follow daily weights and strict intake/output.  Elevated BP and moderate pulmonary HTN -will continue tx with BIDIL  moderate protein calorie malnutrition -follow nutrition service rec's -good nutrition and hydration discussed with patient    DVT Prophylaxis Lovenox ordered.   Family Communication/Anticipated D/C date and plan/Code Status   Family Communication: Son at bedside. Disposition Plan/date: Plan for discharge to skilled nursing facility in the next 24 hours Code Status: Full code.   Procedures and diagnostic studies:   Dg Chest 2 View  03/23/2016  CLINICAL DATA:  Wheezing, fever EXAM: CHEST  2 VIEW COMPARISON:  10/09/2014 FINDINGS: Diffuse interstitial prominence throughout the lungs, worsened since prior study. Heart is normal size. No effusions. No acute bony abnormality. IMPRESSION: Severe diffuse interstitial prominence throughout the lungs, worsening since prior study. This likely reflects underlying chronic interstitial lung disease. Cannot exclude superimposed interstitial pneumonia. Electronically Signed   By: Charlett Nose M.D.   On: 03/23/2016 15:24   Ct Chest High Resolution  03/28/2016  CLINICAL DATA:  Increased shortness of breath and interstitial markings on chest x-ray. EXAM: CT CHEST WITHOUT CONTRAST TECHNIQUE: Multidetector CT imaging of the chest was performed following the standard protocol without intravenous contrast. High resolution imaging of the lungs, as well as inspiratory and expiratory imaging, was performed. COMPARISON:  03/22/2012 and 05/09/2011. FINDINGS: Mediastinum/Lymph Nodes: Mediastinal lymph nodes measure up to 1.9 cm in the lower right paratracheal station, increased from 1.0 cm. Hilar regions are difficult to definitively evaluate without IV contrast. No axillary adenopathy. Atherosclerotic calcification of the arterial vasculature. Heart is at the upper limits of  normal in size. No pericardial effusion. Small hiatal hernia.  Lungs/Pleura: Subpleural reticulation and traction bronchiectasis/bronchiolectasis appear progressive from 05/09/2011. There is superimposed mid and lower lung zone predominant patchy ground-glass, new. Trace bilateral pleural effusions. Airway is unremarkable. Minimal air trapping. Upper abdomen: Visualized portions of the liver, gallbladder, adrenal glands, kidneys, spleen unremarkable. Small hiatal hernia. Musculoskeletal: No worrisome lytic or sclerotic lesions. IMPRESSION: 1. Basilar predominant patchy ground-glass may be due to edema or pneumonia. 2. Subpleural reticulation traction bronchiectasis/bronchiolectasis, progressive from 05/09/2011, indicative of usual interstitial pneumonitis. 3. Tiny bilateral effusions. 4. Mediastinal adenopathy is likely reactive. Electronically Signed   By: Leanna Battles M.D.   On: 03/28/2016 08:14   2-D echo:  - Left ventricle: The cavity size was normal. Systolic function was  normal. The estimated ejection fraction was in the range of 60%  to 65%. Wall motion was normal; there were no regional wall  motion abnormalities. Features are consistent with a pseudonormal  left ventricular filling pattern, with concomitant abnormal  relaxation and increased filling pressure (grade 2 diastolic  dysfunction). - Aortic valve: Mildly calcified annulus. Trileaflet; normal  thickness, mildly calcified leaflets. - Mitral valve: Calcified annulus. There was mild regurgitation. - Left atrium: The atrium was mildly dilated. - Pulmonary arteries: PA peak pressure: 45 mm Hg (S).  Impressions: - The right ventricular systolic pressure was increased consistent  with moderate pulmonary hypertension.  Medical Consultants:    Pulmonology  Anti-Infectives:   Rocephin/azithromycin 03/24/16--->04/01/16 (to complete 8 days total)   Subjective:   [name] states feeling little better,  submental oxygen weaned down to 2 L.  Objective:    Filed Vitals:   03/29/16 2136 03/30/16 0615 03/30/16 0830 03/30/16 1328  BP: 149/75 154/76  158/70  Pulse: 95 84 89 93  Temp: 97.7 F (36.5 C) 98 F (36.7 C)  97.8 F (36.6 C)  TempSrc: Oral Oral  Oral  Resp: Height:      Weight:      SpO2: 96% 95% 98% 99%    Intake/Output Summary (Last 24 hours) at 03/30/16 1512 Last data filed at 03/30/16 1250  Gross per 24 hour  Intake     50 ml  Output    750 ml  Net   -700 ml   Filed Weights   03/24/16 2055  Weight: 61.281 kg (135 lb 1.6 oz)    Exam: Gen:  Chronically ill-appearing, awake and alert, currently on 2 L supplemental oxygen  Cardiovascular:  Tachy, No M/R/G; per telemetry SR. Respiratory:  Lungs with decrease Bs at bases, positive rhonchi and end exp wheezing Gastrointestinal:  Abdomen soft, NT/ND, + BS Extremities:  No C/E/C   Data Reviewed:    Labs: Basic Metabolic Panel:  Recent Labs Lab 03/24/16 1511 03/25/16 0502 03/29/16 0502  NA 139 140 144  K 3.6 3.6 3.1*  CL 104 110 108  CO2 22 19* 26  GLUCOSE 99 90 132*  BUN 11 8 23*  CREATININE 0.53 0.48 0.44  CALCIUM 8.7* 8.2* 8.4*   GFR Estimated Creatinine Clearance: 58.1 mL/min (by C-G formula based on Cr of 0.44).  CBC:  Recent Labs Lab 03/24/16 1511 03/25/16 0502  WBC 11.0* 10.1  NEUTROABS 8.2* 6.6  HGB 10.7* 10.2*  HCT 31.7* 30.8*  MCV 87.1 89.0  PLT 366 338   Sepsis Labs:  Recent Labs Lab 03/24/16 1511 03/24/16 1713 03/25/16 0502  WBC 11.0*  --  10.1  LATICACIDVEN  --  1.81  --    Microbiology Recent Results (from the past  240 hour(s))  Blood Culture (routine x 2)     Status: None   Collection Time: 03/24/16  4:58 PM  Result Value Ref Range Status   Specimen Description BLOOD LEFT ANTECUBITAL  Final   Special Requests BOTTLES DRAWN AEROBIC AND ANAEROBIC 5 ML  Final   Culture   Final    NO GROWTH 5 DAYS Performed at [facility]    Report Status  03/29/2016 FINAL  Final  Blood Culture (routine x 2)     Status: None   Collection Time: 03/24/16  5:00 PM  Result Value Ref Range Status   Specimen Description BLOOD RIGHT FOREARM  Final   Special Requests BOTTLES DRAWN AEROBIC AND ANAEROBIC 5 ML  Final   Culture   Final    NO GROWTH 5 DAYS Performed at [facility]    Report Status 03/29/2016 FINAL  Final     Medications:   . antiseptic oral rinse  7 mL Mouth Rinse BID  . budesonide (PULMICORT) nebulizer solution  0.5 mg Nebulization BID  . cefTRIAXone (ROCEPHIN)  IV  1 g Intravenous Q24H  . enoxaparin (LOVENOX) injection  40 mg Subcutaneous Q24H  . feeding supplement (ENSURE ENLIVE)  237 mL Oral TID BM  . guaiFENesin  600 mg Oral BID  . ipratropium-albuterol  3 mL Nebulization TID  . isosorbide-hydrALAZINE  1 tablet Oral BID  . levalbuterol  0.63 mg Nebulization Once  . predniSONE  60 mg Oral Q breakfast   Continuous Infusions:    Time spent: 25 minutes  LOS: 6 days   Jeralyn BennettZAMORA, Lucretia Pendley  [facility] Pager [phone]  03/30/2016, 3:12 PM

## 2016-03-30 NOTE — Clinical Social Work Placement (Signed)
  CSW continuing to follow. CSW provided SNF bed offers. Pt and pt sons plan to review offers to make decision for SNF. CSW to follow up with decision.   CLINICAL SOCIAL WORK PLACEMENT  NOTE  Date:  03/30/2016  Patient Details  Name: Marlou PorchCarolyn L Yordy MRN: [id number] Date of Birth: 08/14/1951  Clinical Social Work is seeking post-discharge placement for this patient at the Skilled  Nursing Facility level of care (*CSW will initial, date and re-position this form in  chart as items are completed):  Yes   Patient/family provided with [facility] Clinical Social Work Department's list of facilities offering this level of care within the geographic area requested by the patient (or if unable, by the patient's family).  Yes   Patient/family informed of their freedom to choose among providers that offer the needed level of care, that participate in Medicare, Medicaid or managed care program needed by the patient, have an available bed and are willing to accept the patient.  Yes   Patient/family informed of [facility]'s ownership interest in [facility] and [facility], as well as of the fact that they are under no obligation to receive care at these facilities.  PASRR submitted to EDS on 03/30/16     PASRR number received on 03/30/16     Existing PASRR number confirmed on       FL2 transmitted to all facilities in geographic area requested by pt/family on 03/30/16     FL2 transmitted to all facilities within larger geographic area on       Patient informed that his/her managed care company has contracts with or will negotiate with certain facilities, including the following:        Yes   Patient/family informed of bed offers received.  Patient chooses bed at       Physician recommends and patient chooses bed at      Patient to be transferred to   on  .  Patient to be transferred to facility by       Patient family notified on   of transfer.  Name of family member notified:         PHYSICIAN       Additional Comment:    _______________________________________________ Orson EvaKIDD, Rochell Puett A, LCSW 03/30/2016, 2:59 PM

## 2016-03-31 DIAGNOSIS — J9610 Chronic respiratory failure, unspecified whether with hypoxia or hypercapnia: Secondary | ICD-10-CM | POA: Diagnosis not present

## 2016-03-31 DIAGNOSIS — J849 Interstitial pulmonary disease, unspecified: Secondary | ICD-10-CM | POA: Diagnosis not present

## 2016-03-31 DIAGNOSIS — I272 Other secondary pulmonary hypertension: Secondary | ICD-10-CM | POA: Diagnosis not present

## 2016-03-31 DIAGNOSIS — D649 Anemia, unspecified: Secondary | ICD-10-CM | POA: Diagnosis not present

## 2016-03-31 DIAGNOSIS — E46 Unspecified protein-calorie malnutrition: Secondary | ICD-10-CM | POA: Diagnosis not present

## 2016-03-31 DIAGNOSIS — J9600 Acute respiratory failure, unspecified whether with hypoxia or hypercapnia: Secondary | ICD-10-CM | POA: Diagnosis not present

## 2016-03-31 DIAGNOSIS — R0902 Hypoxemia: Secondary | ICD-10-CM | POA: Diagnosis not present

## 2016-03-31 DIAGNOSIS — R262 Difficulty in walking, not elsewhere classified: Secondary | ICD-10-CM | POA: Diagnosis not present

## 2016-03-31 DIAGNOSIS — Z5189 Encounter for other specified aftercare: Secondary | ICD-10-CM | POA: Diagnosis not present

## 2016-03-31 DIAGNOSIS — R002 Palpitations: Secondary | ICD-10-CM | POA: Diagnosis not present

## 2016-03-31 DIAGNOSIS — R6510 Systemic inflammatory response syndrome (SIRS) of non-infectious origin without acute organ dysfunction: Secondary | ICD-10-CM | POA: Diagnosis not present

## 2016-03-31 DIAGNOSIS — I5032 Chronic diastolic (congestive) heart failure: Secondary | ICD-10-CM | POA: Diagnosis not present

## 2016-03-31 DIAGNOSIS — I10 Essential (primary) hypertension: Secondary | ICD-10-CM | POA: Diagnosis not present

## 2016-03-31 DIAGNOSIS — D638 Anemia in other chronic diseases classified elsewhere: Secondary | ICD-10-CM | POA: Diagnosis not present

## 2016-03-31 DIAGNOSIS — J189 Pneumonia, unspecified organism: Secondary | ICD-10-CM | POA: Diagnosis not present

## 2016-03-31 DIAGNOSIS — D72829 Elevated white blood cell count, unspecified: Secondary | ICD-10-CM | POA: Diagnosis not present

## 2016-03-31 DIAGNOSIS — M6281 Muscle weakness (generalized): Secondary | ICD-10-CM | POA: Diagnosis not present

## 2016-03-31 DIAGNOSIS — J9601 Acute respiratory failure with hypoxia: Secondary | ICD-10-CM | POA: Diagnosis not present

## 2016-03-31 DIAGNOSIS — R5381 Other malaise: Secondary | ICD-10-CM | POA: Diagnosis not present

## 2016-03-31 LAB — BASIC METABOLIC PANEL
Anion gap: 9 (ref 5–15)
BUN: 24 mg/dL — AB (ref 4–21)
BUN: 24 mg/dL — AB (ref 6–20)
CHLORIDE: 106 mmol/L (ref 101–111)
CO2: 25 mmol/L (ref 22–32)
CREATININE: 0.48 mg/dL (ref 0.44–1.00)
Calcium: 8.4 mg/dL — ABNORMAL LOW (ref 8.9–10.3)
Creatinine: 0.5 mg/dL (ref 0.5–1.1)
GFR calc Af Amer: >60 mL/min (ref >60)
GFR calc non Af Amer: >60 mL/min (ref >60)
GLUCOSE: 94 mg/dL
Glucose, Bld: 94 mg/dL (ref 65–99)
POTASSIUM: 3.9 mmol/L (ref 3.5–5.1)
SODIUM: 140 mmol/L (ref 137–147)
SODIUM: 140 mmol/L (ref 135–145)

## 2016-03-31 LAB — CBC
HEMATOCRIT: 30.7 % — AB (ref 36.0–46.0)
Hemoglobin: 10.1 g/dL — ABNORMAL LOW (ref 12.0–15.0)
MCH: 29.5 pg (ref 26.0–34.0)
MCHC: 32.9 g/dL (ref 30.0–36.0)
MCV: 89.8 fL (ref 78.0–100.0)
PLATELETS: 387 10*3/uL (ref 150–400)
RBC: 3.42 MIL/uL — AB (ref 3.87–5.11)
RDW: 14.4 % (ref 11.5–15.5)
WBC: 13.1 10*3/uL — AB (ref 4.0–10.5)

## 2016-03-31 LAB — CBC AND DIFFERENTIAL: WBC: 13.1 10*3/mL

## 2016-03-31 MED ORDER — ISOSORB DINITRATE-HYDRALAZINE 20-37.5 MG PO TABS: 1.0000 | ORAL_TABLET | Freq: Two times a day (BID) | ORAL | Status: DC

## 2016-03-31 MED ORDER — ENSURE ENLIVE PO LIQD: 237.0000 mL | Freq: Three times a day (TID) | ORAL | Status: DC

## 2016-03-31 MED ORDER — PREDNISONE 10 MG (21) PO TBPK: ORAL_TABLET | ORAL | Status: DC

## 2016-03-31 MED ORDER — GUAIFENESIN ER 600 MG PO TB12: 600.0000 mg | ORAL_TABLET | Freq: Two times a day (BID) | ORAL | Status: DC

## 2016-03-31 MED ORDER — BENZONATATE 200 MG PO CAPS: 200.0000 mg | ORAL_CAPSULE | Freq: Three times a day (TID) | ORAL | Status: DC | PRN

## 2016-03-31 NOTE — Discharge Summary (Signed)
Physician Discharge Summary  Julie Page OZH:[id number] DOB: 05/03/1951 DOA: 03/24/2016  PCP: Abbe Amsterdam, MD  Admit date: 03/24/2016 Discharge date: 03/31/2016  Time spent: 35 minutes  Recommendations for Outpatient Follow-up:  1. Please give Prednisone 60 mg PO q daily x 1 week, followed by 50 mg PO q daily for 1 week, thereafter continue with 40 mg PO q daily until she is seen by Pulmonary Medicine who will provide further recommendations.  2. Please follow up on her blood pressures, BIDIL 20/37.5 added to her regimen during this hospitalization   Discharge Diagnoses:  Principal Problem:   CAP (community acquired pneumonia) Active Problems:   Interstitial lung disease (HCC)   Anemia   SIRS (systemic inflammatory response syndrome) (HCC)   Hypoxia   ILD (interstitial lung disease) (HCC)   Community acquired pneumonia   Malnutrition of moderate degree   Pulmonary HTN (HCC)   Chronic diastolic heart failure (HCC)   Discharge Condition: Stable  Diet recommendation: Heart healthy  Filed Weights   03/24/16 2055  Weight: 61.281 kg (135 lb 1.6 oz)    History of present illness:  Julie Page is a 65 year old female with a past medical history significant for Raynaud's, HLD, and autoimmune lung scarring; who presents with complaints of cough and shortness of breath. She reports having flulike symptoms 2 weeks ago fevers up to 102.80F after her son had the flu prior. When she initially went to go get evaluated she was out of the window to receive Tamiflu and therefore was not tested. She reports initially getting better, but subsequently over the last weekend progressively declined reporting recurrent cough and fever. She reports fevers up to 101.55F. She saw her primary care provider yesterday and was diagnosed with pneumonia and was given a gram of Rocephin in the office, then prescriptions for DuoNeb, and Levaquin. She took one dose in the office and then subsequently a dose  this morning, however symptoms did not improve. When she follow back up with her primary care provider she was worse today and found to be tachycardic with reported low O2 saturations. Her primary care provider sent her to the emergency department for further evaluation.  Upon admission patient is evaluated and seen to have heart rate of 114, respirations 22, O2 saturations 90%. Initial lab work revealed a WBC of 11, hemoglobin at 10.7, lactic acid 1.81. Chest x-ray showing diffuse interstitial prominence throughout both lungs reflecting chronic interstitial lung disease although cannot exclude a superimposed interstitial pneumonia. She was placed on Rocephin and azithromycin IV in the ED. [facility] consulted to admit  Hospital Course:  Julie Page is an 65 y.o. female with a PMH of Raynauds, hyperlipidemia, and ILD, suspected to be autoimmune,  Had been seen by Dr. Delton Coombes of pulmonology/rheumatology in the past, who was admitted 03/24/16 initially presented with flulike symptoms including fever and cough. She had been diagnosed with pneumonia by her PCP and placed on Levaquin 500 mg PO q daily. Despite this intervention symptoms continued to worsen. She had not been on chronic steroid therapy. Pevious autoimmune work up had been suggestive of Sjogrens (no symptoms of dry eye/mouth) per patient report. ANA negative in 2012. RA elevated. Scleroderma Scl-70 not elevated. During this hospitalization she was treated with empiric IV ceftriaxone 1 g every 24 hours. High resolution CT scan of lungs performed on 03/25/2016 showed patchy groundglass opacities at bilateral bases. Meanwhile she was also started on systemic steroids by Pulmonary Medicine for probable underlying interstitial lung disease. As thoracic  echocardiogram performed during this hospitalization revealed right ventricular systolic pressure elevated consistent with pulmonary hypertension. She was started on BIDIL. She showed gradual  clinical improvement with a decrease in oxygen requirement from 6 L nasal cannula down to 2 L nasal cannula. PCCM recommended slow steroid taper with prednisone 60 mg by mouth daily for one week followed by 50 mg by mouth daily for 1 week and 40 mg by mouth daily thereafter. She will be evaluated by Dr Marchelle Gearing in the Pulmonary clinic for further recommendations on prednisone. She was discharged to skilled nursing facility in stable condition on 03/31/2016.   Consultations:  Pulmonary critical care medicine  Discharge Exam: Filed Vitals:   03/31/16 0648 03/31/16 0900  BP: 146/75 134/61  Pulse: 87   Temp: 97.6 F (36.4 C)   Resp: 20     Gen: Chronically ill-appearing, awake and alert, currently on 2 L supplemental oxygen  Cardiovascular: Tachy, No M/R/G; per telemetry SR. Respiratory: Lungs with decrease Bs at bases, positive rhonchi and end exp wheezing Gastrointestinal: Abdomen soft, NT/ND, + BS Extremities: No C/E/C  Discharge Instructions   Discharge Instructions    Call MD for:  difficulty breathing, headache or visual disturbances    Complete by:  As directed      Call MD for:  extreme fatigue    Complete by:  As directed      Call MD for:  hives    Complete by:  As directed      Call MD for:  persistant dizziness or light-headedness    Complete by:  As directed      Call MD for:  persistant nausea and vomiting    Complete by:  As directed      Call MD for:  redness, tenderness, or signs of infection (pain, swelling, redness, odor or green/yellow discharge around incision site)    Complete by:  As directed      Call MD for:  severe uncontrolled pain    Complete by:  As directed      Call MD for:  temperature >100.4    Complete by:  As directed      Call MD for:    Complete by:  As directed      Diet - low sodium heart healthy    Complete by:  As directed      Increase activity slowly    Complete by:  As directed           Current Discharge Medication  List    START taking these medications   Details  benzonatate (TESSALON) 200 MG capsule Take 1 capsule (200 mg total) by mouth 3 (three) times daily as needed for cough. Qty: 20 capsule, Refills: 0    feeding supplement, ENSURE ENLIVE, (ENSURE ENLIVE) LIQD Take 237 mLs by mouth 3 (three) times daily between meals. Qty: 237 mL, Refills: 12    guaiFENesin (MUCINEX) 600 MG 12 hr tablet Take 1 tablet (600 mg total) by mouth 2 (two) times daily. Qty: 60 tablet, Refills: 0    isosorbide-hydrALAZINE (BIDIL) 20-37.5 MG tablet Take 1 tablet by mouth 2 (two) times daily. Qty: 60 tablet, Refills: 1    predniSONE (STERAPRED UNI-PAK 21 TAB) 10 MG (21) TBPK tablet Take 60 mg PO q daily for 1 week followed by 50 mg PO q daily for 1 week, followed by 40 mg PO q daily thereafter. QTY SUFF Qty: 40 tablet, Refills: 0      CONTINUE these medications which have  NOT CHANGED   Details  Albuterol Sulfate (PROAIR RESPICLICK) 108 (90 Base) MCG/ACT AEPB Inhale 2 puffs into the lungs every 4 (four) hours as needed. Qty: 1 each, Refills: 2    HOMEOPATHIC PRODUCTS PO Take 1 capsule by mouth daily as needed (flu symptoms).    ipratropium-albuterol (DUONEB) 0.5-2.5 (3) MG/3ML SOLN Take 3 mLs by nebulization every 4 (four) hours as needed. Qty: 360 mL, Refills: 1   Associated Diagnoses: Wheezing; CAP (community acquired pneumonia)    Vitamin D, Ergocalciferol, (DRISDOL) 50000 UNITS CAPS Take 50,000 Units by mouth every 7 (seven) days. Reported on 03/23/2016    budesonide-formoterol (SYMBICORT) 160-4.5 MCG/ACT inhaler Inhale 2 puffs into the lungs 2 (two) times daily. Qty: 1 Inhaler, Refills: 11    Spacer/Aero-Holding Chambers (NESSI SPACER WITH MASK SM/MED) DEVI Use as directed with albuterol inhaler Qty: 1 Device, Refills: 0      STOP taking these medications     aspirin 325 MG EC tablet      levofloxacin (LEVAQUIN) 500 MG tablet      predniSONE (DELTASONE) 20 MG tablet        Allergies  Allergen  Reactions  . Azithromycin Other (See Comments)    Burning and itching at IV infusion site   Follow-up Information    [facility]/2016.   Specialty:  Pulmonary Disease   Why:  Appt at 1PM    Contact information:   [address] [place] [address] [phone]       Follow up with Abbe AmsterdamOPLAND,JESSICA, MD In 2 days.   Specialty:  Family Medicine   Contact information:   [address] [place] [address] [phone]        The results of significant diagnostics from this hospitalization (including imaging, microbiology, ancillary and laboratory) are listed below for reference.    Significant Diagnostic Studies: Dg Chest 2 View  03/23/2016  CLINICAL DATA:  Wheezing, fever EXAM: CHEST  2 VIEW COMPARISON:  10/09/2014 FINDINGS: Diffuse interstitial prominence throughout the lungs, worsened since prior study. Heart is normal size. No effusions. No acute bony abnormality. IMPRESSION: Severe diffuse interstitial prominence throughout the lungs, worsening since prior study. This likely reflects underlying chronic interstitial lung disease. Cannot exclude superimposed interstitial pneumonia. Electronically Signed   By: Charlett NoseKevin  Dover M.D.   On: 03/23/2016 15:24   Ct Chest High Resolution  03/28/2016  CLINICAL DATA:  Increased shortness of breath and interstitial markings on chest x-ray. EXAM: CT CHEST WITHOUT CONTRAST TECHNIQUE: Multidetector CT imaging of the chest was performed following the standard protocol without intravenous contrast. High resolution imaging of the lungs, as well as inspiratory and expiratory imaging, was performed. COMPARISON:  03/22/2012 and 05/09/2011. FINDINGS: Mediastinum/Lymph Nodes: Mediastinal lymph nodes measure up to 1.9 cm in the lower right paratracheal station, increased from 1.0 cm. Hilar regions are difficult to definitively evaluate without IV contrast. No axillary adenopathy. Atherosclerotic calcification of the  arterial vasculature. Heart is at the upper limits of normal in size. No pericardial effusion. Small hiatal hernia. Lungs/Pleura: Subpleural reticulation and traction bronchiectasis/bronchiolectasis appear progressive from 05/09/2011. There is superimposed mid and lower lung zone predominant patchy ground-glass, new. Trace bilateral pleural effusions. Airway is unremarkable. Minimal air trapping. Upper abdomen: Visualized portions of the liver, gallbladder, adrenal glands, kidneys, spleen unremarkable. Small hiatal hernia. Musculoskeletal: No worrisome lytic or sclerotic lesions. IMPRESSION: 1. Basilar predominant patchy ground-glass may be due to edema or pneumonia. 2. Subpleural reticulation traction bronchiectasis/bronchiolectasis, progressive  from 05/09/2011, indicative of usual interstitial pneumonitis. 3. Tiny bilateral effusions. 4. Mediastinal adenopathy is likely reactive. Electronically Signed   By: Leanna Battles M.D.   On: 03/28/2016 08:14    Microbiology: Recent Results (from the past 240 hour(s))  Blood Culture (routine x 2)     Status: None   Collection Time: 03/24/16  4:58 PM  Result Value Ref Range Status   Specimen Description BLOOD LEFT ANTECUBITAL  Final   Special Requests BOTTLES DRAWN AEROBIC AND ANAEROBIC 5 ML  Final   Culture   Final    NO GROWTH 5 DAYS Performed at [facility]    Report Status 03/29/2016 FINAL  Final  Blood Culture (routine x 2)     Status: None   Collection Time: 03/24/16  5:00 PM  Result Value Ref Range Status   Specimen Description BLOOD RIGHT FOREARM  Final   Special Requests BOTTLES DRAWN AEROBIC AND ANAEROBIC 5 ML  Final   Culture   Final    NO GROWTH 5 DAYS Performed at [facility]    Report Status 03/29/2016 FINAL  Final     Labs: Basic Metabolic Panel:  Recent Labs Lab 03/24/16 1511 03/25/16 0502 03/29/16 0502 03/31/16 0509  NA 139 140 144 140  K 3.6 3.6 3.1* 3.9  CL 104 110 108 106  CO2 22 19* 26 25  GLUCOSE  99 90 132* 94  BUN 11 8 23* 24*  CREATININE 0.53 0.48 0.44 0.48  CALCIUM 8.7* 8.2* 8.4* 8.4*   Liver Function Tests: No results for input(s): AST, ALT, ALKPHOS, BILITOT, PROT, ALBUMIN in the last 168 hours. No results for input(s): LIPASE, AMYLASE in the last 168 hours. No results for input(s): AMMONIA in the last 168 hours. CBC:  Recent Labs Lab 03/24/16 1511 03/25/16 0502 03/31/16 0509  WBC 11.0* 10.1 13.1*  NEUTROABS 8.2* 6.6  --   HGB 10.7* 10.2* 10.1*  HCT 31.7* 30.8* 30.7*  MCV 87.1 89.0 89.8  PLT 366 338 387   Cardiac Enzymes: No results for input(s): CKTOTAL, CKMB, CKMBINDEX, TROPONINI in the last 168 hours. BNP: BNP (last 3 results)  Recent Labs  03/25/16 1725  BNP 200.2*    ProBNP (last 3 results) No results for input(s): PROBNP in the last 8760 hours.  CBG: No results for input(s): GLUCAP in the last 168 hours.     Signed:  Jeralyn Bennett MD.  [facility] 03/31/2016, 12:30 PM

## 2016-03-31 NOTE — Progress Notes (Signed)
Physical Therapy Treatment Patient Details Name: Julie PorchCarolyn L Page MRN: [id number] DOB: 10/06/1951 Today's Date: 03/31/2016    History of Present Illness 65 year old female with PMH Raynaud's, high cholesterol and suspected history of ILD who presented to ED on 4/6 with complaints progressive shortness of breath and admitted with CAP with SIRS and ILD with pulmonary/critical following    PT Comments    Assisted OOB to amb a limited distance.    Follow Up Recommendations  SNF ([facility])     Equipment Recommendations       Recommendations for Other Services       Precautions / Restrictions Precautions Precautions: Fall Precaution Comments: monitor sats Restrictions Weight Bearing Restrictions: No    Mobility  Bed Mobility Overal bed mobility: Modified Independent             General bed mobility comments: increased time  Transfers Overall transfer level: Needs assistance Equipment used: None Transfers: Sit to/from Stand Sit to Stand: Min guard         General transfer comment: min/guard for safety  Ambulation/Gait Ambulation/Gait assistance: Min assist Ambulation Distance (Feet): 52 Feet Assistive device: None   Gait velocity: decreased   General Gait Details: declined use of walker.  Mild unsteady gait with occassional drift R and L.  On 2 lts O2 sats decrease from 94% to 87%.  DOE 3/4.  Limited activity tolerance.    Stairs            Wheelchair Mobility    Modified Rankin (Stroke Patients Only)       Balance                                    Cognition Arousal/Alertness: Awake/alert Behavior During Therapy: WFL for tasks assessed/performed Overall Cognitive Status: Within Functional Limits for tasks assessed                      Exercises      General Comments        Pertinent Vitals/Pain Pain Assessment: No/denies pain    Home Living                      Prior Function             PT Goals (current goals can now be found in the care plan section) Progress towards PT goals: Progressing toward goals    Frequency  Min 3X/week    PT Plan      Co-evaluation             End of Session           Time: [id number] PT Time Calculation (min) (ACUTE ONLY): 25 min  Charges:  $Gait Training: 8-22 mins $Therapeutic Activity: 8-22 mins                    G Codes:      Felecia ShellingLori Altus Zaino  PTA [facility]  Acute  Rehab Pager      [phone]

## 2016-03-31 NOTE — Clinical Social Work Placement (Signed)
[facility]. Patient & son, Genevie CheshireBilly at bedside made aware. Discharge packet given to RN, Catie. [facility] called for transport.     Lincoln MaxinKelly Kerilyn Cortner, LCSW [facility] Clinical Social Worker cell #: [phone]    CLINICAL SOCIAL WORK PLACEMENT  NOTE  Date:  03/31/2016  Patient Details  Name: Marlou PorchCarolyn L Kluger MRN: [id number] Date of Birth: 11/17/1951  Clinical Social Work is seeking post-discharge placement for this patient at the Skilled  Nursing Facility level of care (*CSW will initial, date and re-position this form in  chart as items are completed):  Yes   Patient/family provided with [facility] Clinical Social Work Department's list of facilities offering this level of care within the geographic area requested by the patient (or if unable, by the patient's family).  Yes   Patient/family informed of their freedom to choose among providers that offer the needed level of care, that participate in Medicare, Medicaid or managed care program needed by the patient, have an available bed and are willing to accept the patient.  Yes   Patient/family informed of [facility]'s ownership interest in [facility] and [facility], as well as of the fact that they are under no obligation to receive care at these facilities.  PASRR submitted to EDS on 03/30/16     PASRR number received on 03/30/16     Existing PASRR number confirmed on       FL2 transmitted to all facilities in geographic area requested by pt/family on 03/30/16     FL2 transmitted to all facilities within larger geographic area on       Patient informed that his/her managed care company has contracts with or will negotiate with certain facilities, including the following:        Yes   Patient/family informed of bed offers received.  [facility]     Physician recommends and patient chooses bed at      [facility]  Patient to be transferred to facility by [facility]     Patient family notified on 03/31/16 of transfer.  Name of family member notified:  patient's son at bedside     PHYSICIAN       Additional Comment:    _______________________________________________ Arlyss RepressHarrison, Shontelle Muska F, LCSW 03/31/2016, 12:54 PM

## 2016-03-31 NOTE — Progress Notes (Signed)
[facility]. Patient will be transported via [facility]. Pt and family made aware.

## 2016-04-04 ENCOUNTER — Non-Acute Institutional Stay (SKILLED_NURSING_FACILITY): Payer: Medicare Other | Admitting: Adult Health

## 2016-04-04 ENCOUNTER — Encounter: Payer: Self-pay | Admitting: Adult Health

## 2016-04-04 DIAGNOSIS — J189 Pneumonia, unspecified organism: Secondary | ICD-10-CM | POA: Diagnosis not present

## 2016-04-04 DIAGNOSIS — R5381 Other malaise: Secondary | ICD-10-CM

## 2016-04-04 DIAGNOSIS — D649 Anemia, unspecified: Secondary | ICD-10-CM

## 2016-04-04 DIAGNOSIS — D72829 Elevated white blood cell count, unspecified: Secondary | ICD-10-CM | POA: Diagnosis not present

## 2016-04-04 DIAGNOSIS — J9600 Acute respiratory failure, unspecified whether with hypoxia or hypercapnia: Secondary | ICD-10-CM

## 2016-04-04 DIAGNOSIS — I10 Essential (primary) hypertension: Secondary | ICD-10-CM | POA: Diagnosis not present

## 2016-04-04 DIAGNOSIS — J849 Interstitial pulmonary disease, unspecified: Secondary | ICD-10-CM

## 2016-04-04 NOTE — Progress Notes (Signed)
Patient ID: Julie Page, female   DOB: 11/22/1951, 65 y.o.   MRN: [id number]    DATE:  04/04/16  MRN:  [id number]  BIRTHDAY: 21-Jan-1951  Facility:  Nursing Home Location:  [facility]  Nursing Home Room Number: 401-P  LEVEL OF CARE:  [phone]  Contact Information    Name Relation Home Work [place] Son [phone]  [phone]   Edna, Rede    [phone]   Loyalty, Arentz   [phone]       Code Status History    Date Active Date Inactive Code Status Order ID Comments User Context   03/24/2016  7:58 PM 03/31/2016  5:05 PM Full Code [id number]  Clydie Braun, MD ED       Chief Complaint  Patient presents with  . Hospitalization Follow-up    HISTORY OF PRESENT ILLNESS:  This is a 65 year old female who has been admitted to [facility] on 03/31/16 from [facility]. She has PMH  of Reynaud's, hyperlipidemia and autoimmune lung scarring. She was treated with acute respiratory failure in setting of CAP and interstitial lung disease. She was treated with antibiotics and placed on Prednisone.  She has been admitted for a short-term rehabilitation.  PAST MEDICAL HISTORY:  Past Medical History  Diagnosis Date  . Raynaud's syndrome   . Hypercholesteremia   . CAP (community acquired pneumonia)   . Anemia   . Chronic diastolic heart failure (HCC)   . Hypoxia   . ILD (interstitial lung disease) (HCC)   . Malnutrition of moderate degree (HCC)   . Pulmonary HTN (HCC)   . SIRS (systemic inflammatory response syndrome) (HCC)      CURRENT MEDICATIONS: Reviewed  [name] Medications  New Prescriptions   No medications on file  Previous Medications   ALBUTEROL SULFATE (PROAIR RESPICLICK) 108 (90 BASE) MCG/ACT AEPB    Inhale 2 puffs into the lungs every 4 (four) hours as needed.   BENZONATATE (TESSALON) 200 MG CAPSULE    Take 1 capsule (200 mg total) by mouth 3 (three) times daily as needed for cough.    BUDESONIDE-FORMOTEROL (SYMBICORT) 160-4.5 MCG/ACT INHALER    Inhale 2 puffs into the lungs 2 (two) times daily.   GUAIFENESIN (MUCINEX) 600 MG 12 HR TABLET    Take 1 tablet (600 mg total) by mouth 2 (two) times daily.   IPRATROPIUM-ALBUTEROL (DUONEB) 0.5-2.5 (3) MG/3ML SOLN    Take 3 mLs by nebulization every 4 (four) hours as needed.   ISOSORBIDE-HYDRALAZINE (BIDIL) 20-37.5 MG TABLET    Take 1 tablet by mouth 2 (two) times daily.   PREDNISONE (STERAPRED UNI-PAK 21 TAB) 10 MG (21) TBPK TABLET    Take 60 mg PO q daily for 1 week followed by 50 mg PO q daily for 1 week, followed by 40 mg PO q daily thereafter. QTY SUFF   UNABLE TO FIND    Med Name: Med Pass 120 mL PO TID for nutritional supplemebt   VITAMIN D, ERGOCALCIFEROL, (DRISDOL) 50000 UNITS CAPS    Take 50,000 Units by mouth every 7 (seven) days. Reported on 03/23/2016  Modified Medications   No medications on file  Discontinued Medications   FEEDING SUPPLEMENT, ENSURE ENLIVE, (ENSURE ENLIVE) LIQD    Take 237 mLs by mouth 3 (three) times daily between meals.   HOMEOPATHIC PRODUCTS PO    Take 1 capsule by mouth daily as needed (flu symptoms).   SPACER/AERO-HOLDING CHAMBERS (NESSI SPACER WITH MASK SM/MED) DEVI  Use as directed with albuterol inhaler     Allergies  Allergen Reactions  . Azithromycin Other (See Comments)    Burning and itching at IV infusion site     REVIEW OF SYSTEMS:  GENERAL: no change in appetite, no fatigue, no weight changes, no fever, chills or weakness EYES: Denies change in vision, dry eyes, eye pain, itching or discharge EARS: Denies change in hearing, ringing in ears, or earache NOSE: Denies nasal congestion or epistaxis MOUTH and THROAT: Denies oral discomfort, gingival pain or bleeding, pain from teeth or hoarseness   RESPIRATORY: no cough, SOB, DOE, wheezing, hemoptysis CARDIAC: no chest pain, edema or palpitations GI: no abdominal pain, diarrhea, constipation, heart burn, nausea or vomiting GU: Denies  dysuria, frequency, hematuria, incontinence, or discharge PSYCHIATRIC: Denies feeling of depression or anxiety. No report of hallucinations, insomnia, paranoia, or agitation   PHYSICAL EXAMINATION  GENERAL APPEARANCE: Well nourished. In no acute distress. Normal body habitus SKIN:  Skin is warm and dry.  HEAD: Normal in size and contour. No evidence of trauma EYES: Lids open and close normally. No blepharitis, entropion or ectropion. PERRL. Conjunctivae are clear and sclerae are white. Lenses are without opacity EARS: Pinnae are normal. Patient hears normal voice tunes of the examiner MOUTH and THROAT: Lips are without lesions. Oral mucosa is moist and without lesions. Tongue is normal in shape, size, and color and without lesions NECK: supple, trachea midline, no neck masses, no thyroid tenderness, no thyromegaly LYMPHATICS: no LAN in the neck, no supraclavicular LAN RESPIRATORY: breathing is even & unlabored, BS CTAB CARDIAC: RRR, no murmur,no extra heart sounds, no edema GI: abdomen soft, normal BS, no masses, no tenderness, no hepatomegaly, no splenomegaly EXTREMITIES:  Able to move X 4 extremities PSYCHIATRIC: Alert and oriented X 3. Affect and behavior are appropriate  LABS/RADIOLOGY: Labs reviewed: Basic Metabolic Panel:  Recent Labs  40/98/11 0502 03/29/16 0502 03/31/16 0509  NA 140 144 140  K 3.6 3.1* 3.9  CL 110 108 106  CO2 19* 26 25  GLUCOSE 90 132* 94  BUN 8 23* 24*  CREATININE 0.48 0.44 0.48  CALCIUM 8.2* 8.4* 8.4*   CBC:  Recent Labs  03/24/16 1511 03/25/16 0502 03/31/16 0509  WBC 11.0* 10.1 13.1*  NEUTROABS 8.2* 6.6  --   HGB 10.7* 10.2* 10.1*  HCT 31.7* 30.8* 30.7*  MCV 87.1 89.0 89.8  PLT 366 338 387     Dg Chest 2 View  03/23/2016  CLINICAL DATA:  Wheezing, fever EXAM: CHEST  2 VIEW COMPARISON:  10/09/2014 FINDINGS: Diffuse interstitial prominence throughout the lungs, worsened since prior study. Heart is normal size. No effusions. No acute  bony abnormality. IMPRESSION: Severe diffuse interstitial prominence throughout the lungs, worsening since prior study. This likely reflects underlying chronic interstitial lung disease. Cannot exclude superimposed interstitial pneumonia. Electronically Signed   By: Charlett Nose M.D.   On: 03/23/2016 15:24   Ct Chest High Resolution  03/28/2016  CLINICAL DATA:  Increased shortness of breath and interstitial markings on chest x-ray. EXAM: CT CHEST WITHOUT CONTRAST TECHNIQUE: Multidetector CT imaging of the chest was performed following the standard protocol without intravenous contrast. High resolution imaging of the lungs, as well as inspiratory and expiratory imaging, was performed. COMPARISON:  03/22/2012 and 05/09/2011. FINDINGS: Mediastinum/Lymph Nodes: Mediastinal lymph nodes measure up to 1.9 cm in the lower right paratracheal station, increased from 1.0 cm. Hilar regions are difficult to definitively evaluate without IV contrast. No axillary adenopathy. Atherosclerotic calcification of the arterial  vasculature. Heart is at the upper limits of normal in size. No pericardial effusion. Small hiatal hernia. Lungs/Pleura: Subpleural reticulation and traction bronchiectasis/bronchiolectasis appear progressive from 05/09/2011. There is superimposed mid and lower lung zone predominant patchy ground-glass, new. Trace bilateral pleural effusions. Airway is unremarkable. Minimal air trapping. Upper abdomen: Visualized portions of the liver, gallbladder, adrenal glands, kidneys, spleen unremarkable. Small hiatal hernia. Musculoskeletal: No worrisome lytic or sclerotic lesions. IMPRESSION: 1. Basilar predominant patchy ground-glass may be due to edema or pneumonia. 2. Subpleural reticulation traction bronchiectasis/bronchiolectasis, progressive from 05/09/2011, indicative of usual interstitial pneumonitis. 3. Tiny bilateral effusions. 4. Mediastinal adenopathy is likely reactive. Electronically Signed   By: Leanna BattlesMelinda   Blietz M.D.   On: 03/28/2016 08:14    ASSESSMENT/PLAN:  Physical deconditioning - for rehabilitation  Acute respiratory failure - in setting of CAP and ILD; continue O2, tapering dose of Prednisone; start Duoneb 0.5-2.5 mg/873ml 1 neb Q AM, and continue Duobeb Q 4 hours PRN; follow-up with pulmonology  ILD - continue Prednisone tapering dose, O2,, mucinex, Duoneb and Symbicort   Leukocytosis - wbc 13.1; currently on Prednisone which may be contributing to her elevated wbc; repeat CBC  CAP - completed antibiotics  Anemia of chronic disease - hgb 10.1 ; will monitor  Hypertension - continue Bidil take 1 tab PO BID     Goals of care:  Short-term rehabilitation    [facility] Medical CenterMEDINA-VARGAS,Baily Hovanec, NP [facility] [phone]

## 2016-04-05 ENCOUNTER — Encounter: Payer: Self-pay | Admitting: Internal Medicine

## 2016-04-05 ENCOUNTER — Non-Acute Institutional Stay (SKILLED_NURSING_FACILITY): Payer: Medicare Other | Admitting: Internal Medicine

## 2016-04-05 DIAGNOSIS — D72829 Elevated white blood cell count, unspecified: Secondary | ICD-10-CM

## 2016-04-05 DIAGNOSIS — R5381 Other malaise: Secondary | ICD-10-CM | POA: Diagnosis not present

## 2016-04-05 DIAGNOSIS — J9601 Acute respiratory failure with hypoxia: Secondary | ICD-10-CM

## 2016-04-05 DIAGNOSIS — J849 Interstitial pulmonary disease, unspecified: Secondary | ICD-10-CM | POA: Diagnosis not present

## 2016-04-05 DIAGNOSIS — D638 Anemia in other chronic diseases classified elsewhere: Secondary | ICD-10-CM | POA: Diagnosis not present

## 2016-04-05 DIAGNOSIS — R002 Palpitations: Secondary | ICD-10-CM

## 2016-04-05 DIAGNOSIS — E46 Unspecified protein-calorie malnutrition: Secondary | ICD-10-CM

## 2016-04-05 DIAGNOSIS — J189 Pneumonia, unspecified organism: Secondary | ICD-10-CM

## 2016-04-05 DIAGNOSIS — I10 Essential (primary) hypertension: Secondary | ICD-10-CM

## 2016-04-05 LAB — CBC AND DIFFERENTIAL
HEMATOCRIT: 32 % — AB (ref 36–46)
HEMOGLOBIN: 10 g/dL — AB (ref 12.0–16.0)
Neutrophils Absolute: 9 /uL
PLATELETS: 468 10*3/uL — AB (ref 150–399)
WBC: 15.7 10*3/mL

## 2016-04-05 NOTE — Progress Notes (Signed)
LOCATION: [facility]  PCP: Abbe Amsterdam, MD   Code Status: Full Code  Goals of care: Advanced Directive information Advanced Directives 03/27/2016  Does patient have an advance directive? No  Would patient like information on creating an advanced directive? Yes - Transport planner given       Extended Emergency Contact Information Primary Emergency Contact: [place] Address: [address]          [place], [place] [address] [place] Home Phone: [phone] Mobile Phone: [phone] Relation: Son Secondary Emergency Contact: Chanda [place] Mobile Phone: [phone] Relation: None   Allergies  Allergen Reactions  . Azithromycin Other (See Comments)    Burning and itching at IV infusion site  . Spiriva Handihaler [Tiotropium Bromide Monohydrate]     Chief Complaint  Patient presents with  . New Admit To SNF    New Admission     HPI:  Patient is a 65 y.o. female seen today for short term rehabilitation post hospital admission from 03/24/16-03/31/16 with acute respiratory failure in setting of CAP and interstitial lung disease. She was treated with antibiotics and placed on prednisone. She is seen in her room today. She is on o2 and is short of breath in between sentences. She would like her duoneb changed to only as needed. Her nebulizer treatment is making her feel jittery and restless. She has PMH of Raynaud's syndrome, CHF, ILD among others. Her sister is present in the room. Patient mentions being allergic to spiriva and would want this documented.   Review of Systems:  Constitutional: Negative for fever, chills, diaphoresis. Energy is slowly coming back.  HENT: Negative for headache, congestion, nasal discharge, hearing loss, sore throat, difficulty swallowing.   Eyes: Negative for blurred vision, double vision and discharge.  Respiratory: Negative for wheezing. Positive for cough with phlegm. On oxygen.  Gets short of breath with minimal exertion.   Cardiovascular: Negative for chest pain, palpitations, leg swelling.  Gastrointestinal: Negative for heartburn, nausea, vomiting, abdominal pain. Last bowel movement was last night.  Genitourinary: Negative for dysuria and flank pain.  Musculoskeletal: Negative for back pain, fall in the facility.  Skin: Negative for itching, rash.  Neurological: positive for weakness. Positive for occasional dizziness with change of position. Psychiatric/Behavioral: Negative for depression   Past Medical History  Diagnosis Date  . Raynaud's syndrome   . Hypercholesteremia   . CAP (community acquired pneumonia)   . Anemia   . Chronic diastolic heart failure (HCC)   . Hypoxia   . ILD (interstitial lung disease) (HCC)   . Malnutrition of moderate degree (HCC)   . Pulmonary HTN (HCC)   . SIRS (systemic inflammatory response syndrome) [facility])    Past Surgical History  Procedure Laterality Date  . Tubal ligation  03/30/1978   Social History:   reports that she has never smoked. She has never used smokeless tobacco. She reports that she does not drink alcohol or use illicit drugs.  Family History  Problem Relation Age of Onset  . Heart disease Mother   . Heart disease Maternal Grandfather   . Heart disease Father     Medications:   Medication List       This list is accurate as of: 04/05/16  3:10 PM.  Always use your most recent med list.               Albuterol Sulfate 108 (90 Base) MCG/ACT Aepb  Commonly known as:  PROAIR RESPICLICK  Inhale  2 puffs into the lungs every 4 (four) hours as needed.     benzonatate 200 MG capsule  Commonly known as:  TESSALON  Take 1 capsule (200 mg total) by mouth 3 (three) times daily as needed for cough.     budesonide-formoterol 160-4.5 MCG/ACT inhaler  Commonly known as:  SYMBICORT  Inhale 2 puffs into the lungs 2 (two) times daily.     guaiFENesin 600 MG 12 hr tablet  Commonly known as:  MUCINEX    Take 1 tablet (600 mg total) by mouth 2 (two) times daily.     ipratropium-albuterol 0.5-2.5 (3) MG/3ML Soln  Commonly known as:  DUONEB  Take 3 mLs by nebulization every 4 (four) hours as needed.     isosorbide-hydrALAZINE 20-37.5 MG tablet  Commonly known as:  BIDIL  Take 1 tablet by mouth 2 (two) times daily.     predniSONE 10 MG (21) Tbpk tablet  Commonly known as:  STERAPRED UNI-PAK 21 TAB  Take 60 mg PO q daily for 1 week followed by 50 mg PO q daily for 1 week, followed by 40 mg PO q daily thereafter. QTY SUFF     UNABLE TO FIND  Med Name: Med Pass 120 mL PO TID for nutritional supplemebt     Vitamin D (Ergocalciferol) 50000 units Caps capsule  Commonly known as:  DRISDOL  Take 50,000 Units by mouth every 7 (seven) days. Reported on 03/23/2016        Immunizations: Immunization History  Administered Date(s) Administered  . PPD Test 03/31/2016     Physical Exam: Filed Vitals:   04/05/16 1505  BP: 138/77  Pulse: 79  Temp: 97.6 F (36.4 C)  TempSrc: Oral  Resp: 18  Height: 5' (1.524 m)  Weight: 135 lb (61.236 kg)  SpO2: 96%   Body mass index is 26.37 kg/(m^2).  General- elderly female, thin built, in no acute distress Head- normocephalic, atraumatic Nose- no maxillary or frontal sinus tenderness, no nasal discharge Throat- moist mucus membrane Eyes- PERRLA, EOMI, no pallor, no icterus, no discharge, normal conjunctiva, normal sclera Neck- no cervical lymphadenopathy Cardiovascular- normal s1,s2, no murmur, trace leg edema Respiratory- bilateral poor air movement, no wheeze, no rhonchi, no crackles, on o2 ans SOB in between sentences Abdomen- bowel sounds present, soft, non tender Musculoskeletal- able to move all 4 extremities, generalized weakness Neurological- alert and oriented to person, place and time Skin- warm and dry Psychiatry- normal mood and affect    Labs reviewed: Basic Metabolic Panel:  Recent Labs  32/44/103/07/05 0502 03/29/16 0502  03/31/16 03/31/16 0509  NA 140 144 140 140  K 3.6 3.1*  --  3.9  CL 110 108  --  106  CO2 19* 26  --  25  GLUCOSE 90 132*  --  94  BUN 8 23* 24* 24*  CREATININE 0.48 0.44 0.5 0.48  CALCIUM 8.2* 8.4*  --  8.4*   CBC:  Recent Labs  03/24/16 1511 03/25/16 0502 03/31/16 03/31/16 0509  WBC 11.0* 10.1 13.1 13.1*  NEUTROABS 8.2* 6.6  --   --   HGB 10.7* 10.2*  --  10.1*  HCT 31.7* 30.8*  --  30.7*  MCV 87.1 89.0  --  89.8  PLT 366 338  --  387    Radiological Exams: Dg Chest 2 View  03/23/2016  CLINICAL DATA:  Wheezing, fever EXAM: CHEST  2 VIEW COMPARISON:  10/09/2014 FINDINGS: Diffuse interstitial prominence throughout the lungs, worsened since prior study. Heart is  normal size. No effusions. No acute bony abnormality. IMPRESSION: Severe diffuse interstitial prominence throughout the lungs, worsening since prior study. This likely reflects underlying chronic interstitial lung disease. Cannot exclude superimposed interstitial pneumonia. Electronically Signed   By: Charlett Nose M.D.   On: 03/23/2016 15:24   Ct Chest High Resolution  03/28/2016  CLINICAL DATA:  Increased shortness of breath and interstitial markings on chest x-ray. EXAM: CT CHEST WITHOUT CONTRAST TECHNIQUE: Multidetector CT imaging of the chest was performed following the standard protocol without intravenous contrast. High resolution imaging of the lungs, as well as inspiratory and expiratory imaging, was performed. COMPARISON:  03/22/2012 and 05/09/2011. FINDINGS: Mediastinum/Lymph Nodes: Mediastinal lymph nodes measure up to 1.9 cm in the lower right paratracheal station, increased from 1.0 cm. Hilar regions are difficult to definitively evaluate without IV contrast. No axillary adenopathy. Atherosclerotic calcification of the arterial vasculature. Heart is at the upper limits of normal in size. No pericardial effusion. Small hiatal hernia. Lungs/Pleura: Subpleural reticulation and traction bronchiectasis/bronchiolectasis  appear progressive from 05/09/2011. There is superimposed mid and lower lung zone predominant patchy ground-glass, new. Trace bilateral pleural effusions. Airway is unremarkable. Minimal air trapping. Upper abdomen: Visualized portions of the liver, gallbladder, adrenal glands, kidneys, spleen unremarkable. Small hiatal hernia. Musculoskeletal: No worrisome lytic or sclerotic lesions. IMPRESSION: 1. Basilar predominant patchy ground-glass may be due to edema or pneumonia. 2. Subpleural reticulation traction bronchiectasis/bronchiolectasis, progressive from 05/09/2011, indicative of usual interstitial pneumonitis. 3. Tiny bilateral effusions. 4. Mediastinal adenopathy is likely reactive. Electronically Signed   By: Leanna Battles M.D.   On: 03/28/2016 08:14    Assessment/Plan  Physical deconditioning Will have patient work with PT/OT as tolerated to regain strength and restore function.  Fall precautions are in place.  Acute respiratory failure In setting of CAP and ILD. Continue o2 for now and her prednisone along with breathing treatment, has f.u with pulmonary  ILD Continue prednisone 60 mg daily x 1 week, then 50 mg daily for a week and then 40 mg daily until seen by pulmonary. Pulmonary toileting and continue o2. Continue mucinex and bronchodilator treatment as below.  Palpitation Her HR is in upper 90s and she complaints of palpitation and jitteriness after taking duoneb. The b agonist in it could be contributing to this. Discontinue duoneb. Start ipratropium nebulizer q4h prn dyspnea and monitor  Leukocytosis On prednisone likely contributing to this, monitor  CAP Has completed antibiotics, afebrile, monitor clinically  Anemia of chronic disease Monitor cbc  HTN Monitor bp bid x 1 week, continue bidil current regimen, check bmp  Protein calorie malnutrition Monitor weight, get dietary consult. Continue medpass      Goals of care: short term rehabilitation   Labs/tests  ordered: cbc, bmp  Family/ staff Communication: reviewed care plan with patient and nursing supervisor    Oneal Grout, MD Internal Medicine [facility] [address] [place], [place] [address] Cell Phone (Monday-Friday 8 am - 5 pm): [phone] On Call: [phone] and follow prompts after 5 pm and on weekends Office Phone: [phone] Office Fax: [phone]

## 2016-04-07 LAB — CBC AND DIFFERENTIAL
HEMATOCRIT: 35 % — AB (ref 36–46)
Hemoglobin: 11 g/dL — AB (ref 12.0–16.0)
NEUTROS ABS: 8 /uL
Platelets: 503 10*3/uL — AB (ref 150–399)
WBC: 12.2 10^3/mL

## 2016-04-22 ENCOUNTER — Non-Acute Institutional Stay (SKILLED_NURSING_FACILITY): Payer: Medicare Other | Admitting: Adult Health

## 2016-04-22 ENCOUNTER — Encounter: Payer: Self-pay | Admitting: Adult Health

## 2016-04-22 DIAGNOSIS — J9610 Chronic respiratory failure, unspecified whether with hypoxia or hypercapnia: Secondary | ICD-10-CM | POA: Diagnosis not present

## 2016-04-22 DIAGNOSIS — J849 Interstitial pulmonary disease, unspecified: Secondary | ICD-10-CM | POA: Diagnosis not present

## 2016-04-22 DIAGNOSIS — R5381 Other malaise: Secondary | ICD-10-CM

## 2016-04-22 DIAGNOSIS — D72829 Elevated white blood cell count, unspecified: Secondary | ICD-10-CM

## 2016-04-22 DIAGNOSIS — D638 Anemia in other chronic diseases classified elsewhere: Secondary | ICD-10-CM | POA: Diagnosis not present

## 2016-04-22 DIAGNOSIS — I10 Essential (primary) hypertension: Secondary | ICD-10-CM | POA: Diagnosis not present

## 2016-04-22 NOTE — Progress Notes (Signed)
Patient ID: Julie Page, female   DOB: 03/01/1951, 65 y.o.   MRN: [id number]    DATE:  04/22/16   MRN:  [id number]  BIRTHDAY: 02/27/1951  Facility:  Nursing Home Location:  [facility]  Nursing Home Room Number: 401-P  LEVEL OF CARE:  SNF [phone]  Contact Information    Name Relation Home Work [employer]   Hofland,Zach Son [phone]  [phone]   Amado CoeBarrier,Billy    [phone]   Lind GuestBarrier,Allison Other   [phone]       Code Status History    Date Active Date Inactive Code Status Order ID Comments User Context   03/24/2016  7:58 PM 03/31/2016  5:05 PM Full Code [id number]  Clydie Braunondell A Smith, MD ED       Chief Complaint  Patient presents with  . Discharge Note    HISTORY OF PRESENT ILLNESS:  This is a 65 year old female who is for discharge home with home health PT for endurance, OT for ADLs, CNA for showers and skilled nursing for disease management. DME: Oxygen 1 L/minute via nasal cannula stationary and portable, standard wheelchair 18" X 18" with elevating leg rests, anti-tippers, cushion and removable arm rest.  She has been [facility]. She has PMH  of Reynaud's, hyperlipidemia and autoimmune lung scarring. She was treated with acute respiratory failure in setting of CAP and interstitial lung disease. She was treated with antibiotics and placed on Prednisone.  Patient was admitted to this facility for short-term rehabilitation after the patient's recent hospitalization.  Patient has completed SNF rehabilitation and therapy has cleared the patient for discharge.   PAST MEDICAL HISTORY:  Past Medical History  Diagnosis Date  . Raynaud's syndrome   . Hypercholesteremia   . CAP (community acquired pneumonia)   . Anemia     Chronic disease  . Chronic diastolic heart failure (HCC)   . Hypoxia   . ILD (interstitial lung disease) (HCC)   . Malnutrition of moderate degree (HCC)   . Pulmonary HTN (HCC)   .  SIRS (systemic inflammatory response syndrome) (HCC)   . Acute respiratory failure with hypoxia (HCC)   . Leukocytosis   . Palpitations   . Protein calorie malnutrition (HCC)      CURRENT MEDICATIONS: Reviewed  [name] Medications  New Prescriptions   No medications on file  Previous Medications   ALBUTEROL SULFATE (PROAIR RESPICLICK) 108 (90 BASE) MCG/ACT AEPB    Inhale 2 puffs into the lungs every 4 (four) hours as needed.   BENZONATATE (TESSALON) 200 MG CAPSULE    Take 1 capsule (200 mg total) by mouth 3 (three) times daily as needed for cough.   BUDESONIDE-FORMOTEROL (SYMBICORT) 160-4.5 MCG/ACT INHALER    Inhale 2 puffs into the lungs 2 (two) times daily.   GUAIFENESIN (MUCINEX) 600 MG 12 HR TABLET    Take 600 mg by mouth 2 (two) times daily as needed.   IBUPROFEN (ADVIL,MOTRIN) 200 MG TABLET    Take 400 mg by mouth 2 (two) times daily as needed. Take 2 tablets to = 400 mg po BID PRN   IPRATROPIUM-ALBUTEROL (DUONEB) 0.5-2.5 (3) MG/3ML SOLN    Take 3 mLs by nebulization every 4 (four) hours as needed.   ISOSORBIDE-HYDRALAZINE (BIDIL) 20-37.5 MG TABLET    Take 1 tablet by mouth 2 (two) times daily.   LEVALBUTEROL (XOPENEX) 0.63 MG/3ML NEBULIZER SOLUTION    Take 0.63 mg by nebulization every 4 (four) hours as needed  for wheezing or shortness of breath.   PREDNISONE (DELTASONE) 20 MG TABLET    Take 40 mg by mouth daily with breakfast. Take 2 tablets to = 40 mg po QD   VITAMIN D, ERGOCALCIFEROL, (DRISDOL) 50000 UNITS CAPS    Take 50,000 Units by mouth every 7 (seven) days. Reported on 03/23/2016  Modified Medications   No medications on file  Discontinued Medications   GUAIFENESIN (MUCINEX) 600 MG 12 HR TABLET    Take 1 tablet (600 mg total) by mouth 2 (two) times daily.   PREDNISONE (STERAPRED UNI-PAK 21 TAB) 10 MG (21) TBPK TABLET    Take 60 mg PO q daily for 1 week followed by 50 mg PO q daily for 1 week, followed by 40 mg PO q daily thereafter. QTY SUFF   UNABLE TO FIND    Med Name:  Med Pass 120 mL PO TID for nutritional supplemebt     Allergies  Allergen Reactions  . Azithromycin Other (See Comments)    Burning and itching at IV infusion site  . Spiriva Handihaler [Tiotropium Bromide Monohydrate]      REVIEW OF SYSTEMS:  GENERAL: no change in appetite, no fatigue, no weight changes, no fever, chills or weakness EYES: Denies change in vision, dry eyes, eye pain, itching or discharge EARS: Denies change in hearing, ringing in ears, or earache NOSE: Denies nasal congestion or epistaxis MOUTH and THROAT: Denies oral discomfort, gingival pain or bleeding, pain from teeth or hoarseness   RESPIRATORY: no cough, SOB, DOE, wheezing, hemoptysis CARDIAC: no chest pain, edema or palpitations GI: no abdominal pain, diarrhea, constipation, heart burn, nausea or vomiting GU: Denies dysuria, frequency, hematuria, incontinence, or discharge PSYCHIATRIC: Denies feeling of depression or anxiety. No report of hallucinations, insomnia, paranoia, or agitation   PHYSICAL EXAMINATION  GENERAL APPEARANCE: Well nourished. In no acute distress. Normal body habitus SKIN:  Skin is warm and dry.  HEAD: Normal in size and contour. No evidence of trauma EYES: Lids open and close normally. No blepharitis, entropion or ectropion. PERRL. Conjunctivae are clear and sclerae are white. Lenses are without opacity EARS: Pinnae are normal. Patient hears normal voice tunes of the examiner MOUTH and THROAT: Lips are without lesions. Oral mucosa is moist and without lesions. Tongue is normal in shape, size, and color and without lesions NECK: supple, trachea midline, no neck masses, no thyroid tenderness, no thyromegaly LYMPHATICS: no LAN in the neck, no supraclavicular LAN RESPIRATORY: breathing is even & unlabored, BS CTAB; has O2@ 1L/min via [place] CARDIAC: RRR, no murmur,no extra heart sounds, no edema GI: abdomen soft, normal BS, no masses, no tenderness, no hepatomegaly, no  splenomegaly EXTREMITIES:  Able to move X 4 extremities PSYCHIATRIC: Alert and oriented X 3. Affect and behavior are appropriate  LABS/RADIOLOGY: Labs reviewed: Basic Metabolic Panel:  Recent Labs  16/10/96 0502 03/29/16 0502 03/31/16 03/31/16 0509  NA 140 144 140 140  K 3.6 3.1*  --  3.9  CL 110 108  --  106  CO2 19* 26  --  25  GLUCOSE 90 132*  --  94  BUN 8 23* 24* 24*  CREATININE 0.48 0.44 0.5 0.48  CALCIUM 8.2* 8.4*  --  8.4*   CBC:  Recent Labs  03/24/16 1511 03/25/16 0502  03/31/16 0509 04/05/16 04/07/16  WBC 11.0* 10.1  < > 13.1* 15.7 12.2  NEUTROABS 8.2* 6.6  --   --  9 8  HGB 10.7* 10.2*  --  10.1* 10.0* 11.0*  HCT 31.7* 30.8*  --  30.7* 32* 35*  MCV 87.1 89.0  --  89.8  --   --   PLT 366 338  --  387 468* 503*  < > = values in this interval not displayed.   Ct Chest High Resolution  03/28/2016  CLINICAL DATA:  Increased shortness of breath and interstitial markings on chest x-ray. EXAM: CT CHEST WITHOUT CONTRAST TECHNIQUE: Multidetector CT imaging of the chest was performed following the standard protocol without intravenous contrast. High resolution imaging of the lungs, as well as inspiratory and expiratory imaging, was performed. COMPARISON:  03/22/2012 and 05/09/2011. FINDINGS: Mediastinum/Lymph Nodes: Mediastinal lymph nodes measure up to 1.9 cm in the lower right paratracheal station, increased from 1.0 cm. Hilar regions are difficult to definitively evaluate without IV contrast. No axillary adenopathy. Atherosclerotic calcification of the arterial vasculature. Heart is at the upper limits of normal in size. No pericardial effusion. Small hiatal hernia. Lungs/Pleura: Subpleural reticulation and traction bronchiectasis/bronchiolectasis appear progressive from 05/09/2011. There is superimposed mid and lower lung zone predominant patchy ground-glass, new. Trace bilateral pleural effusions. Airway is unremarkable. Minimal air trapping. Upper abdomen: Visualized  portions of the liver, gallbladder, adrenal glands, kidneys, spleen unremarkable. Small hiatal hernia. Musculoskeletal: No worrisome lytic or sclerotic lesions. IMPRESSION: 1. Basilar predominant patchy ground-glass may be due to edema or pneumonia. 2. Subpleural reticulation traction bronchiectasis/bronchiolectasis, progressive from 05/09/2011, indicative of usual interstitial pneumonitis. 3. Tiny bilateral effusions. 4. Mediastinal adenopathy is likely reactive. Electronically Signed   By: Leanna Battles M.D.   On: 03/28/2016 08:14    ASSESSMENT/PLAN:  Physical deconditioning - for Home health PT, OT, CNA and Skilled Nurse  Chronic respiratory failure -  continue O2, continue Levalbuterol 0.63 mg/41ml-2.5 mg/15ml  Via inhalation Q 4 hours PRN, Ipratropium bromide 0.02% 2.5 ml via neb Q 4 hours PRN, Prednisone 20 mg take 2 tabs = 40 mg PO daily, Symbicort 160-4.5 mcg/ACT inhale 2 puffs into the lungs BID; follow-up with pulmonology  ILD - continue Prednisone 40 mg daily, O2 @ 1L/min via [place],  mucinex 600 mg BID PRN, Levalbuterol, Ipratropium bromide and Symbicort   Leukocytosis - wbc 13.1; re-check wbs 12.2, trending down, patient still on Prednisone  Anemia of chronic disease - hgb 10.1 ; re-check hgb 11.0, improved  Hypertension - well-controlled; continue Bidil take 1 tab PO BID      I have filled out patient's discharge paperwork and written prescriptions.  Patient will receive home health PT, OT, Skilled Nursing and CNA.  DME provided:  Oxygen 1 L/minute via nasal cannula stationary and portable, standard wheelchair 18" X 18" with elevating leg rests, anti-tippers, cushion and removable arm rest  Total discharge time: Greater than 30 minutes  Discharge time involved coordination of the discharge process with Child psychotherapist, nursing staff and therapy department. Medical justification for home health services/DME verified.    Kenard Gower, NP [employer] [phone]

## 2016-04-25 DIAGNOSIS — I7300 Raynaud's syndrome without gangrene: Secondary | ICD-10-CM | POA: Diagnosis not present

## 2016-04-25 DIAGNOSIS — I272 Other secondary pulmonary hypertension: Secondary | ICD-10-CM | POA: Diagnosis not present

## 2016-04-25 DIAGNOSIS — I5032 Chronic diastolic (congestive) heart failure: Secondary | ICD-10-CM | POA: Diagnosis not present

## 2016-04-25 DIAGNOSIS — E440 Moderate protein-calorie malnutrition: Secondary | ICD-10-CM | POA: Diagnosis not present

## 2016-04-25 DIAGNOSIS — R260 Ataxic gait: Secondary | ICD-10-CM | POA: Diagnosis not present

## 2016-04-25 DIAGNOSIS — Z7982 Long term (current) use of aspirin: Secondary | ICD-10-CM | POA: Diagnosis not present

## 2016-04-25 DIAGNOSIS — Z7952 Long term (current) use of systemic steroids: Secondary | ICD-10-CM | POA: Diagnosis not present

## 2016-04-25 DIAGNOSIS — Z8701 Personal history of pneumonia (recurrent): Secondary | ICD-10-CM | POA: Diagnosis not present

## 2016-04-25 DIAGNOSIS — Z9981 Dependence on supplemental oxygen: Secondary | ICD-10-CM | POA: Diagnosis not present

## 2016-04-25 DIAGNOSIS — J849 Interstitial pulmonary disease, unspecified: Secondary | ICD-10-CM | POA: Diagnosis not present

## 2016-04-25 DIAGNOSIS — D649 Anemia, unspecified: Secondary | ICD-10-CM | POA: Diagnosis not present

## 2016-04-26 ENCOUNTER — Telehealth: Payer: Self-pay | Admitting: Family Medicine

## 2016-04-26 DIAGNOSIS — I272 Other secondary pulmonary hypertension: Secondary | ICD-10-CM | POA: Diagnosis not present

## 2016-04-26 DIAGNOSIS — I7300 Raynaud's syndrome without gangrene: Secondary | ICD-10-CM | POA: Diagnosis not present

## 2016-04-26 DIAGNOSIS — I5032 Chronic diastolic (congestive) heart failure: Secondary | ICD-10-CM | POA: Diagnosis not present

## 2016-04-26 DIAGNOSIS — J849 Interstitial pulmonary disease, unspecified: Secondary | ICD-10-CM | POA: Diagnosis not present

## 2016-04-26 DIAGNOSIS — D649 Anemia, unspecified: Secondary | ICD-10-CM | POA: Diagnosis not present

## 2016-04-26 DIAGNOSIS — R260 Ataxic gait: Secondary | ICD-10-CM | POA: Diagnosis not present

## 2016-04-26 NOTE — Telephone Encounter (Signed)
°  Relationship to patient: [facility] Can be reached: [phone]    Reason for call: Needs approval for PT 2 times a week for 4 weeks

## 2016-04-27 NOTE — Telephone Encounter (Signed)
Called her back and gave order

## 2016-04-28 ENCOUNTER — Other Ambulatory Visit: Payer: Medicare Other

## 2016-04-28 ENCOUNTER — Encounter: Payer: Self-pay | Admitting: Internal Medicine

## 2016-04-28 ENCOUNTER — Ambulatory Visit (INDEPENDENT_AMBULATORY_CARE_PROVIDER_SITE_OTHER): Payer: Medicare Other | Admitting: Internal Medicine

## 2016-04-28 ENCOUNTER — Telehealth (HOSPITAL_COMMUNITY): Payer: Self-pay | Admitting: Vascular Surgery

## 2016-04-28 VITALS — BP 150/72 | HR 98 | Ht 60.0 in | Wt 128.0 lb

## 2016-04-28 DIAGNOSIS — J9611 Chronic respiratory failure with hypoxia: Secondary | ICD-10-CM

## 2016-04-28 DIAGNOSIS — J84112 Idiopathic pulmonary fibrosis: Secondary | ICD-10-CM

## 2016-04-28 DIAGNOSIS — R0902 Hypoxemia: Secondary | ICD-10-CM | POA: Diagnosis not present

## 2016-04-28 DIAGNOSIS — I272 Other secondary pulmonary hypertension: Secondary | ICD-10-CM

## 2016-04-28 NOTE — Progress Notes (Addendum)
Subjective:     Patient ID: Julie Page, female   DOB: [birth year], 65 y.o.   MRN: [id number]  HPI  OV 04/28/2016  Chief Complaint  Patient presents with  . Follow-up    Pt here for HFU for pna and ILD. Pt states she is still occassionally having DOE, prod cough with clear mucus. Pt denies CP/tightness.    65 year old female presents with [place] on [facility] for evaluation of interstitial lung disease. History is gained from talking to her, her son review of the outside chart and old chart.  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 This building has mold in it and on and off ever since she started working that she started having respirations symptoms. In 2012 and 2013 she was evaluated by [name] in our institution. 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 There is also history of [name] and rheumatoid factor antibody. At that time pulmonary function test showed isolated low reduction in diffusion capacity that was mild. She only had dyspnea on climbing stairs at that point in time. She continued to be in stable health up until spring of 2017. During this time she continued to work in the building. In the interim she did follow-up with [name] at [facility] rheumatology. Pertinent lab tests are listed below. No systemic evidence of autoimmune disease was discovered and she was on serial follow-up.  Then most recently in April 2017 she had 2 weeks of respiratory exacerbation and then hospitalized for 1 week. CT scan of the chest at this point in time shows classic UIP pattern associated with diffuse groundglass opacity consistent with UIP flare. Significant progression since previous CT scan in 2012. She was discharged on steroid taper which she still is taking and 1 L oxygen. She is extremely physically  deconditioned. She spent some time in inpatient rehabilitation. She is now at home and is going to start home physical therapy. She notices that at room air at rest she would be 92% can be off oxygen for a few hours when she exerts she can drop into the 80s.  Echocardiogram at this point in time shows normal ejection fraction but elevated right ventricular and pulmonary artery systolic pressure to 40 mmHg.  She and her son have many questions pertaining to etiology, prognosis, treatment and outlook and disability application.    Autoimmune labs - Rheumatoid factor s 24 and in April 2013 was 59 in our health system. But with [name] at [facility] was 71 and 102, in 2014 in 2015 respectively - [name] was positive at 1 in April 2017 in our health system and was trace positive at 2.0 with [name] in September 2014. - No evidence that she's had CCP antibody tested for hypersensitivity panel tested -In September 2014 with [name] was trace positive but negative [name] scleroderma 70 SSB and double-stranded DNA antibodies.. Similar result in April 2017.  Waking desat test 185 feet x 3 laps on RA: desat to 86% in 1/2 to 1 lap and stopped    has a past medical history of Raynaud's syndrome; Hypercholesteremia; CAP (community acquired pneumonia); Anemia; Chronic diastolic heart failure (HCC); Hypoxia; ILD (interstitial lung disease) (HCC); Malnutrition of moderate degree (HCC); Pulmonary HTN (HCC); SIRS (systemic inflammatory response syndrome) (HCC); Acute respiratory failure with hypoxia (HCC);  Leukocytosis; Palpitations; and Protein calorie malnutrition (HCC).   reports that she has never smoked. She has never used smokeless tobacco.  Past Surgical History  Procedure Laterality Date  . Tubal ligation  03/30/1978    Allergies  Allergen Reactions  . Azithromycin Other (See Comments)    Burning and itching at IV infusion site  . Spiriva Handihaler [Tiotropium  Bromide Monohydrate]     Immunization History  Administered Date(s) Administered  . PPD Test 03/31/2016    Family History  Problem Relation Age of Onset  . Heart disease Mother   . Heart disease Maternal Grandfather   . Heart disease Father      Current outpatient prescriptions:  .  Albuterol Sulfate (PROAIR RESPICLICK) 108 (90 Base) MCG/ACT AEPB, Inhale 2 puffs into the lungs every 4 (four) hours as needed., Disp: 1 each, Rfl: 2 .  benzonatate (TESSALON) 200 MG capsule, Take 1 capsule (200 mg total) by mouth 3 (three) times daily as needed for cough., Disp: 20 capsule, Rfl: 0 .  budesonide-formoterol (SYMBICORT) 160-4.5 MCG/ACT inhaler, Inhale 2 puffs into the lungs 2 (two) times daily., Disp: 1 Inhaler, Rfl: 11 .  guaiFENesin (MUCINEX) 600 MG 12 hr tablet, Take 600 mg by mouth 2 (two) times daily as needed., Disp: , Rfl:  .  ibuprofen (ADVIL,MOTRIN) 200 MG tablet, Take 400 mg by mouth 2 (two) times daily as needed. Take 2 tablets to = 400 mg po BID PRN, Disp: , Rfl:  .  ipratropium-albuterol (DUONEB) 0.5-2.5 (3) MG/3ML SOLN, Take 3 mLs by nebulization every 4 (four) hours as needed., Disp: 360 mL, Rfl: 1 .  isosorbide-hydrALAZINE (BIDIL) 20-37.5 MG tablet, Take 1 tablet by mouth 2 (two) times daily., Disp: 60 tablet, Rfl: 1 .  predniSONE (DELTASONE) 20 MG tablet, Take 40 mg by mouth daily with breakfast. Take 2 tablets to = 40 mg po QD, Disp: , Rfl:  .  Vitamin D, Ergocalciferol, (DRISDOL) 50000 UNITS CAPS, Take 50,000 Units by mouth every 7 (seven) days. Reported on 03/23/2016, Disp: , Rfl:  .  levalbuterol (XOPENEX) 0.63 MG/3ML nebulizer solution, Take 0.63 mg by nebulization every 4 (four) hours as needed for wheezing or shortness of breath. Reported on 04/28/2016, Disp: , Rfl:   Current facility-administered medications:  .  albuterol (PROVENTIL) (2.5 MG/3ML) 0.083% nebulizer solution 2.5 mg, 2.5 mg, Nebulization, Once, Pearline CablesJessica C Copland, MD     Review of Systems      Objective:   Physical Exam  Filed Vitals:   04/28/16 1341  BP: 150/72  Pulse: 98  Height: 5' (1.524 m)  Weight: 128 lb (58.06 kg)  SpO2: 97%   Estimated body mass index is 25 kg/(m^2) as calculated from the following:   Height as of this encounter: 5' (1.524 m).   Weight as of this encounter: 128 lb (58.06 kg).   General exam: Frail female seated in a wheelchair. Central nervous system: Alert and oriented 3. Speech normal. Central nervous system: Alert and oriented 3 speech normal. Respiratory exam: Bilaterally air entry equal. No increased work of breathing. Oxygen on. Pulse ox 92% on room air at rest sitting bilateral bibasal crackles present Extremity: No cyanosis no clubbing no edema. No evidence of autoimmune vasculitis diseases on extremity exam Skin exam: Appears intact on exposed areas Cardiovascular: Normal heart sounds regular rate and rhythm. Psych: pleasant but somewhat flat     Assessment:       ICD-9-CM ICD-10-CM   1. IPF (idiopathic pulmonary fibrosis) (HCC) 516.31 J84.112 Cyclic  citrul peptide antibody, IgG     Pulmonary Function Test     AMB referral to pulmonary rehabilitation     Ambulatory referral to Cardiology  2. Chronic hypoxemic respiratory failure (HCC) 518.83 J96.11 Cyclic citrul peptide antibody, IgG   799.02  Ambulatory referral to Cardiology  3. Pulmonary hypertension (HCC) 416.8 I27.2 Ambulatory referral to Cardiology        Plan:       #Pulmnary hypertension - echo APrill 2017  - refer Dr Shirlee Latch for rright heart cath - possible role for [facility] Rx  # ILD Current diagnosis is IPF. IDeally needs biopsy but with o2 need, deconditiopning and recent hospitalization - will have to defer for now + CT is already with honeycombing. Ddx is autpoimune and HP based on hx  PLAN - do CCP and HP panel blood test 04/28/2016 - if  CCP is strongly positive then diagnosis will change to Autoimmune ILD because this test is very specific - do walk test on  room air  - nurse to fill out handicap placard  - please apply for permanent disability - nurse will give you instructions - cotninue Home PT next few weeks - continue home o2 to keep pulse ox > 86% - refer pulmonary rehab - reduce prednisone to 30mg  per day x 1 weeks then 20mg  per day x  1 week and then 10mg  per day to continue - further reduction at followup (not helping subjectively) - read about esbriet v ofev - highlights below - assuming is IPF; reconsider cellcept if CCP + - in long future - lung transplant an option but will need to get her fit  FOllowup  - full PFT in 3 weeks - return to see me in 3 weeks to discuss drug therapy   ............... Both drugs [name] and Esbriet only slow down progression, 1 out of 6 patients  - this means extension in quality of life but no difference in symptoms  - no study directly compares the 2 drugs but efficacy roughly equal at 1 year time point   - [name]  - - time to first exacerbation possibly reduced in one trial but not in another - twice daily, no titration, potentially more convenient dosing  - no need for sunscreen  - high chance of mild diarrhea but low chance of significant diarrhea needing to stop medication.   - Rx diarrhea with lomotil - slight increase in heart attack risk and theoretical increase in bleeding risk,   - need monthly blood work for 3 months and then every 6 months - monitor liver function   - ESBRIET  - 3 pill three times daily, slow titration.  - Need to wean sunscreen  - Some chance of nausea and anorexia with small chance for diarrhea  - no known heart attack risk - no known bleeding risk,   - need monthly blood work for 6 months - monitor liver function - possible mortality benefit in pooled analysis  - larger world wide experience  Disclosure: Dr Marchelle Gearing has participated in trials with Esbriet and has been compensated by [employer] Lu[employer] / [employer]       > 50% of this > 40 min visit spent in face  to face counseling or/and coordination of care    Dr. Kalman Shan, M.D., [facility].C.P Pulmonary and Critical Care Medicine Staff Physician [facility] Pager: [phone], If no answer or between  15:00h - 7:00h: call [phone]  04/28/2016 2:40 PM

## 2016-04-28 NOTE — Patient Instructions (Addendum)
ICD-9-CM ICD-10-CM   1. IPF (idiopathic pulmonary fibrosis) (HCC) 516.31 J84.112 Cyclic citrul peptide antibody, IgG     Pulmonary Function Test     AMB referral to pulmonary rehabilitation     Ambulatory referral to Cardiology  2. Chronic hypoxemic respiratory failure (HCC) 518.83 J96.11 Cyclic citrul peptide antibody, IgG   799.02  Ambulatory referral to Cardiology  3. Pulmonary hypertension (HCC) 416.8 I27.2 Ambulatory referral to Cardiology   #Pulmnary hypertension - echo APrill 2017  - refer Dr [name] for rright heart cath  # ILD Current diagnosis is IPF  PLAN - do CCP and HP panel blood test 04/28/2016 - if  CCP is strongly positive then diagnosis will change to Autoimmune ILD because this test is very specific - do walk test on room air  - nurse to fill out handicap placard  - please apply for permanent disability - nurse will give you instructions - cotninue Home PT next few weeks - continue home o2 to keep pulse ox > 86% - refer pulmonary rehab - reduce prednisone to 30mg  per day x 1 weeks then 20mg  per day x  1 week and then 10mg  per day to continue - further reduction at followup - read about esbriet v ofev - highlights below - assuming is IPF - in long future - lung transplant an option  FOllowup  - full PFT in 3 weeks - return to see me in 3 weeks to discuss drug therapy   ............... Both drugs [name] and Esbriet only slow down progression, 1 out of 6 patients  - this means extension in quality of life but no difference in symptoms  - no study directly compares the 2 drugs but efficacy roughly equal at 1 year time point   - [name]  - - time to first exacerbation possibly reduced in one trial but not in another - twice daily, no titration, potentially more convenient dosing  - no need for sunscreen  - high chance of mild diarrhea but low chance of significant diarrhea needing to stop medication.   - Rx diarrhea with lomotil - slight increase in heart attack  risk and theoretical increase in bleeding risk,   - need monthly blood work for 3 months and then every 6 months - monitor liver function   - ESBRIET  - 3 pill three times daily, slow titration.  - Need to wean sunscreen  - Some chance of nausea and anorexia with small chance for diarrhea  - no known heart attack risk - no known bleeding risk,   - need monthly blood work for 6 months - monitor liver function - possible mortality benefit in pooled analysis  - larger world wide experience  Disclosure: Dr Marchelle Gearingamaswamy has participated in trials with Esbriet and has been compensated by [employer]

## 2016-04-28 NOTE — Telephone Encounter (Signed)
[facility]

## 2016-04-29 DIAGNOSIS — I7300 Raynaud's syndrome without gangrene: Secondary | ICD-10-CM | POA: Diagnosis not present

## 2016-04-29 DIAGNOSIS — R260 Ataxic gait: Secondary | ICD-10-CM | POA: Diagnosis not present

## 2016-04-29 DIAGNOSIS — J849 Interstitial pulmonary disease, unspecified: Secondary | ICD-10-CM | POA: Diagnosis not present

## 2016-04-29 DIAGNOSIS — I272 Other secondary pulmonary hypertension: Secondary | ICD-10-CM | POA: Diagnosis not present

## 2016-04-29 DIAGNOSIS — D649 Anemia, unspecified: Secondary | ICD-10-CM | POA: Diagnosis not present

## 2016-04-29 DIAGNOSIS — I5032 Chronic diastolic (congestive) heart failure: Secondary | ICD-10-CM | POA: Diagnosis not present

## 2016-04-29 LAB — CYCLIC CITRUL PEPTIDE ANTIBODY, IGG

## 2016-05-02 DIAGNOSIS — I7300 Raynaud's syndrome without gangrene: Secondary | ICD-10-CM | POA: Diagnosis not present

## 2016-05-02 DIAGNOSIS — J849 Interstitial pulmonary disease, unspecified: Secondary | ICD-10-CM | POA: Diagnosis not present

## 2016-05-02 DIAGNOSIS — I272 Other secondary pulmonary hypertension: Secondary | ICD-10-CM | POA: Diagnosis not present

## 2016-05-02 DIAGNOSIS — R260 Ataxic gait: Secondary | ICD-10-CM | POA: Diagnosis not present

## 2016-05-02 DIAGNOSIS — I5032 Chronic diastolic (congestive) heart failure: Secondary | ICD-10-CM | POA: Diagnosis not present

## 2016-05-02 DIAGNOSIS — D649 Anemia, unspecified: Secondary | ICD-10-CM | POA: Diagnosis not present

## 2016-05-03 DIAGNOSIS — J849 Interstitial pulmonary disease, unspecified: Secondary | ICD-10-CM | POA: Diagnosis not present

## 2016-05-03 DIAGNOSIS — I7300 Raynaud's syndrome without gangrene: Secondary | ICD-10-CM | POA: Diagnosis not present

## 2016-05-03 DIAGNOSIS — R260 Ataxic gait: Secondary | ICD-10-CM | POA: Diagnosis not present

## 2016-05-03 DIAGNOSIS — I272 Other secondary pulmonary hypertension: Secondary | ICD-10-CM | POA: Diagnosis not present

## 2016-05-03 DIAGNOSIS — I5032 Chronic diastolic (congestive) heart failure: Secondary | ICD-10-CM | POA: Diagnosis not present

## 2016-05-03 DIAGNOSIS — D649 Anemia, unspecified: Secondary | ICD-10-CM | POA: Diagnosis not present

## 2016-05-04 ENCOUNTER — Telehealth: Payer: Self-pay | Admitting: Internal Medicine

## 2016-05-04 ENCOUNTER — Encounter: Payer: Self-pay | Admitting: Family Medicine

## 2016-05-04 ENCOUNTER — Ambulatory Visit (INDEPENDENT_AMBULATORY_CARE_PROVIDER_SITE_OTHER): Payer: Medicare Other | Admitting: Family Medicine

## 2016-05-04 VITALS — BP 109/65 | HR 93 | Temp 98.5°F | Ht 60.0 in | Wt 127.0 lb

## 2016-05-04 DIAGNOSIS — J8410 Pulmonary fibrosis, unspecified: Secondary | ICD-10-CM | POA: Diagnosis not present

## 2016-05-04 DIAGNOSIS — Z9981 Dependence on supplemental oxygen: Secondary | ICD-10-CM | POA: Diagnosis not present

## 2016-05-04 DIAGNOSIS — R5381 Other malaise: Secondary | ICD-10-CM | POA: Diagnosis not present

## 2016-05-04 DIAGNOSIS — F430 Acute stress reaction: Secondary | ICD-10-CM | POA: Diagnosis not present

## 2016-05-04 LAB — HYPERSENSITIVITY PNUEMONITIS PROFILE

## 2016-05-04 NOTE — Telephone Encounter (Signed)
Spoke with pt and reviewed results. Pt voiced understanding. Nothing further needed.

## 2016-05-04 NOTE — Telephone Encounter (Signed)
Let Julie Page or her son [name] know that that blood tests are normal. This makes the case for IPF more likely than autoimmune. Will discuss 05/27/16 visit

## 2016-05-04 NOTE — Progress Notes (Signed)
[facility] [address], [address] [place], [place] [address] [phone] [phone]  Date:  05/04/2016   Name:  Julie PorchCarolyn L Matheson   DOB:  04/01/1951   MRN:  [id number]  PCP:  Abbe AmsterdamOPLAND,Rilie Glanz, MD    Chief Complaint: Hospitalization Follow-up   History of Present Illness:  Julie Page is a 65 y.o. very pleasant female patient who presents with the following:  Here today to recheck from hospital stay- she was seen here last month with CAP, and she also has IPF. She got pretty sick and ended up inpt for a week.  Was discharged home with oxygen. Her son is staying with her and helping her.  She is now seeing Dr. Marchelle Gearingamaswamy for pulm care She is still tapering down on prednisone.   She is not sure if she will need to stay on oxygen long term  She is seeing PT twice a week and OT twice a week, and an RN twice a week  She uses a concentrator at home but has to use a tank when they go out.  This is limiting her mobility.  Admits that she is having some sx of depression (she has had this in the past as well, treated with wellbutrin).  She is tearful and feels sad a lot of the time.  No SI.  She declines treatment for depression at this time as she hopes that her situation will be temporary  She is able to get dressed and walk for about 2 minutes without stopping to rest  Patient Active Problem List   Diagnosis Date Noted  . IPF (idiopathic pulmonary fibrosis) (HCC) 04/28/2016  . Chronic hypoxemic respiratory failure (HCC) 04/28/2016  . Pulmonary hypertension (HCC) 04/28/2016  . Chronic diastolic heart failure (HCC)   . Malnutrition of moderate degree 03/28/2016  . Pulmonary HTN (HCC)   . Community acquired pneumonia   . Anemia 03/25/2016  . SIRS (systemic inflammatory response syndrome) (HCC) 03/25/2016  . Hypoxia   . ILD (interstitial lung disease) (HCC)   . CAP (community acquired pneumonia) 03/24/2016  . Chronic cough 04/09/2012  .  Interstitial lung disease (HCC) 06/13/2011    Past Medical History  Diagnosis Date  . Raynaud's syndrome   . Hypercholesteremia   . CAP (community acquired pneumonia)   . Anemia     Chronic disease  . Chronic diastolic heart failure (HCC)   . Hypoxia   . ILD (interstitial lung disease) (HCC)   . Malnutrition of moderate degree (HCC)   . Pulmonary HTN (HCC)   . SIRS (systemic inflammatory response syndrome) (HCC)   . Acute respiratory failure with hypoxia (HCC)   . Leukocytosis   . Palpitations   . Protein calorie malnutrition [facility](HCC)     Past Surgical History  Procedure Laterality Date  . Tubal ligation  03/30/1978    Social History  Substance Use Topics  . Smoking status: Never Smoker   . Smokeless tobacco: Never Used  . Alcohol Use: No     Comment: rare ETOH use    Family History  Problem Relation Age of Onset  . Heart disease Mother   . Heart disease Maternal Grandfather   . Heart disease Father     Allergies  Allergen Reactions  . Azithromycin Other (See Comments)    Burning and itching at IV infusion site  . Spiriva Handihaler [Tiotropium Bromide Monohydrate]     Medication list has been reviewed and updated.  Current Outpatient Prescriptions on File Prior to Visit  Medication Sig Dispense Refill  . benzonatate (TESSALON) 200 MG capsule Take 1 capsule (200 mg total) by mouth 3 (three) times daily as needed for cough. 20 capsule 0  . isosorbide-hydrALAZINE (BIDIL) 20-37.5 MG tablet Take 1 tablet by mouth 2 (two) times daily. 60 tablet 1  . predniSONE (DELTASONE) 20 MG tablet Take 30 mg by mouth daily with breakfast. Take 30 mg daily until 5/20. On 5/20 start taking  daily, then on 5/27 start taking  daily    . Vitamin D, Ergocalciferol, (DRISDOL) 50000 UNITS CAPS Take 50,000 Units by mouth every 7 (seven) days. Reported on 03/23/2016     Current Facility-Administered Medications on File Prior to Visit  Medication Dose Route Frequency Provider Last  Rate Last Dose  . albuterol (PROVENTIL) (2.5 MG/3ML) 0.083% nebulizer solution 2.5 mg  2.5 mg Nebulization Once Gwenlyn Found Akito Boomhower, MD        Review of Systems:  As per HPI- otherwise negative. Wt Readings from Last 3 Encounters:  05/04/16 127 lb (57.607 kg)  04/28/16 128 lb (58.06 kg)  04/22/16 133 lb 3.2 oz (60.419 kg)      Physical Examination: Filed Vitals:   05/04/16 1147  BP: 109/65  Pulse: 93  Temp: 98.5 F (36.9 C)   Filed Vitals:   05/04/16 1147  Height: 5' (1.524 m)  Weight: 127 lb (57.607 kg)   Body mass index is 24.8 kg/(m^2). Ideal Body Weight: Weight in (lb) to have BMI = 25: 127.7  GEN: WDWN, NAD, Non-toxic, A & O x 3, sitting in a WC and wearing nasal canula O2 today.  Has lost weight HEENT: Atraumatic, Normocephalic. Neck supple. No masses, No LAD. Ears and Nose: No external deformity. CV: RRR, No M/G/R. No JVD. No thrill. No extra heart sounds. PULM: CTA B, no wheezes, crackles, rhonchi. No retractions. No resp. distress. No accessory muscle use. Lungs sound good today EXTR: No c/c/e NEURO using a wc today PSYCH: Normally interactive. Conversant. Not depressed or anxious appearing.  Calm demeanor.    Assessment and Plan: Physical debility  Postinflammatory pulmonary fibrosis (HCC)  Acute stress reaction  Oxygen dependent  Here today to recheck following hospital stay for CAP.  unfortunately her baseline IPF has become much more symptomatic and she is now on oxygen.  Her recent decrease in health is distressing for her- however she hopes that she will soon be back to normal.  Declines treatment for depression at this time.  She is encouraged by the possibility of not having to use the large oxygen tank for outings- I will try to arrange a more portable O2 source for her  Signed Abbe Amsterdam, MD

## 2016-05-04 NOTE — Progress Notes (Signed)
Pre visit review using our clinic review tool, if applicable. No additional management support is needed unless otherwise documented below in the visit note. 

## 2016-05-04 NOTE — Patient Instructions (Signed)
Let me see if we can get you a portable oxygen tank or concentrator so you can have more freedom If you do decide you would like medication for depression please just give me a call and I would be glad to help you with this.    It was great to see you today- I hope that you continue to gain strength quickly

## 2016-05-05 DIAGNOSIS — I7300 Raynaud's syndrome without gangrene: Secondary | ICD-10-CM | POA: Diagnosis not present

## 2016-05-05 DIAGNOSIS — J849 Interstitial pulmonary disease, unspecified: Secondary | ICD-10-CM | POA: Diagnosis not present

## 2016-05-05 DIAGNOSIS — I272 Other secondary pulmonary hypertension: Secondary | ICD-10-CM | POA: Diagnosis not present

## 2016-05-05 DIAGNOSIS — R260 Ataxic gait: Secondary | ICD-10-CM | POA: Diagnosis not present

## 2016-05-05 DIAGNOSIS — I5032 Chronic diastolic (congestive) heart failure: Secondary | ICD-10-CM | POA: Diagnosis not present

## 2016-05-05 DIAGNOSIS — D649 Anemia, unspecified: Secondary | ICD-10-CM | POA: Diagnosis not present

## 2016-05-09 DIAGNOSIS — J849 Interstitial pulmonary disease, unspecified: Secondary | ICD-10-CM | POA: Diagnosis not present

## 2016-05-09 DIAGNOSIS — R260 Ataxic gait: Secondary | ICD-10-CM | POA: Diagnosis not present

## 2016-05-09 DIAGNOSIS — I5032 Chronic diastolic (congestive) heart failure: Secondary | ICD-10-CM | POA: Diagnosis not present

## 2016-05-09 DIAGNOSIS — D649 Anemia, unspecified: Secondary | ICD-10-CM | POA: Diagnosis not present

## 2016-05-09 DIAGNOSIS — I272 Other secondary pulmonary hypertension: Secondary | ICD-10-CM | POA: Diagnosis not present

## 2016-05-09 DIAGNOSIS — I7300 Raynaud's syndrome without gangrene: Secondary | ICD-10-CM | POA: Diagnosis not present

## 2016-05-10 DIAGNOSIS — D649 Anemia, unspecified: Secondary | ICD-10-CM | POA: Diagnosis not present

## 2016-05-10 DIAGNOSIS — J849 Interstitial pulmonary disease, unspecified: Secondary | ICD-10-CM | POA: Diagnosis not present

## 2016-05-10 DIAGNOSIS — I7300 Raynaud's syndrome without gangrene: Secondary | ICD-10-CM | POA: Diagnosis not present

## 2016-05-10 DIAGNOSIS — R260 Ataxic gait: Secondary | ICD-10-CM | POA: Diagnosis not present

## 2016-05-10 DIAGNOSIS — I272 Other secondary pulmonary hypertension: Secondary | ICD-10-CM | POA: Diagnosis not present

## 2016-05-10 DIAGNOSIS — I5032 Chronic diastolic (congestive) heart failure: Secondary | ICD-10-CM | POA: Diagnosis not present

## 2016-05-11 DIAGNOSIS — J849 Interstitial pulmonary disease, unspecified: Secondary | ICD-10-CM | POA: Diagnosis not present

## 2016-05-11 DIAGNOSIS — D649 Anemia, unspecified: Secondary | ICD-10-CM | POA: Diagnosis not present

## 2016-05-11 DIAGNOSIS — I7300 Raynaud's syndrome without gangrene: Secondary | ICD-10-CM | POA: Diagnosis not present

## 2016-05-11 DIAGNOSIS — I272 Other secondary pulmonary hypertension: Secondary | ICD-10-CM | POA: Diagnosis not present

## 2016-05-11 DIAGNOSIS — I5032 Chronic diastolic (congestive) heart failure: Secondary | ICD-10-CM | POA: Diagnosis not present

## 2016-05-11 DIAGNOSIS — R260 Ataxic gait: Secondary | ICD-10-CM | POA: Diagnosis not present

## 2016-05-12 ENCOUNTER — Telehealth: Payer: Self-pay | Admitting: Internal Medicine

## 2016-05-12 ENCOUNTER — Ambulatory Visit (HOSPITAL_COMMUNITY)
Admission: RE | Admit: 2016-05-12 | Discharge: 2016-05-12 | Disposition: A | Discharge status: Home / Self Care | Payer: Medicare Other | Source: Ambulatory Visit | Attending: Internal Medicine | Admitting: Internal Medicine

## 2016-05-12 DIAGNOSIS — I7300 Raynaud's syndrome without gangrene: Secondary | ICD-10-CM | POA: Diagnosis not present

## 2016-05-12 DIAGNOSIS — R260 Ataxic gait: Secondary | ICD-10-CM | POA: Diagnosis not present

## 2016-05-12 DIAGNOSIS — J849 Interstitial pulmonary disease, unspecified: Secondary | ICD-10-CM

## 2016-05-12 DIAGNOSIS — I5032 Chronic diastolic (congestive) heart failure: Secondary | ICD-10-CM | POA: Diagnosis not present

## 2016-05-12 DIAGNOSIS — J84112 Idiopathic pulmonary fibrosis: Secondary | ICD-10-CM | POA: Insufficient documentation

## 2016-05-12 DIAGNOSIS — D649 Anemia, unspecified: Secondary | ICD-10-CM | POA: Diagnosis not present

## 2016-05-12 DIAGNOSIS — I272 Other secondary pulmonary hypertension: Secondary | ICD-10-CM | POA: Diagnosis not present

## 2016-05-12 LAB — PULMONARY FUNCTION TEST
DL/VA % pred: -5 %
DL/VA: -0.24 ml/min/mmHg/L
DLCO UNC % PRED: -2 %
DLCO UNC: -0.38 ml/min/mmHg
FEF 25-75 PRE: 1.21 L/s
FEF 25-75 Post: 1.49 L/sec
FEF2575-%CHANGE-POST: 23 %
FEF2575-%PRED-PRE: 63 %
FEF2575-%Pred-Post: 78 %
FEV1-%Change-Post: 2 %
FEV1-%PRED-POST: 61 %
FEV1-%Pred-Pre: 60 %
FEV1-POST: 1.27 L
FEV1-Pre: 1.24 L
FEV1FVC-%Change-Post: 8 %
FEV1FVC-%Pred-Pre: 109 %
FEV6-%CHANGE-POST: -5 %
FEV6-%PRED-POST: 53 %
FEV6-%Pred-Pre: 56 %
FEV6-POST: 1.38 L
FEV6-PRE: 1.46 L
FEV6FVC-%PRED-POST: 104 %
FEV6FVC-%PRED-PRE: 104 %
FVC-%Change-Post: -5 %
FVC-%Pred-Post: 51 %
FVC-%Pred-Pre: 54 %
FVC-Post: 1.38 L
FVC-Pre: 1.46 L
POST FEV1/FVC RATIO: 92 %
POST FEV6/FVC RATIO: 100 %
PRE FEV6/FVC RATIO: 100 %
Pre FEV1/FVC ratio: 85 %
RV % PRED: 159 %
RV: 3.01 L
TLC % PRED: 102 %
TLC: 4.58 L

## 2016-05-12 MED ORDER — ALBUTEROL SULFATE (2.5 MG/3ML) 0.083% IN NEBU
2.5000 mg | INHALATION_SOLUTION | Freq: Once | RESPIRATORY_TRACT | Status: AC
  Administered 2016-05-12: 2.5 mg via RESPIRATORY_TRACT

## 2016-05-12 NOTE — Telephone Encounter (Signed)
Attempted to contact pt. No answer, no option to leave a message. Will try back.  

## 2016-05-12 NOTE — Addendum Note (Signed)
Addended by: Boone MasterJONES, Timothy Trudell E on: 05/12/2016 05:06 PM   Modules accepted: Orders

## 2016-05-12 NOTE — Telephone Encounter (Signed)
Received call from [facility] w/ Pulm Rehab requesting recent order for Pulm Rehab be redone to include O2 Previous order cancelled New order placed Nothing further needed; will sign off.

## 2016-05-13 DIAGNOSIS — I5032 Chronic diastolic (congestive) heart failure: Secondary | ICD-10-CM | POA: Diagnosis not present

## 2016-05-13 DIAGNOSIS — J849 Interstitial pulmonary disease, unspecified: Secondary | ICD-10-CM | POA: Diagnosis not present

## 2016-05-13 DIAGNOSIS — R260 Ataxic gait: Secondary | ICD-10-CM | POA: Diagnosis not present

## 2016-05-13 DIAGNOSIS — I272 Other secondary pulmonary hypertension: Secondary | ICD-10-CM | POA: Diagnosis not present

## 2016-05-13 DIAGNOSIS — D649 Anemia, unspecified: Secondary | ICD-10-CM | POA: Diagnosis not present

## 2016-05-13 DIAGNOSIS — I7300 Raynaud's syndrome without gangrene: Secondary | ICD-10-CM | POA: Diagnosis not present

## 2016-05-13 MED ORDER — BENZONATATE 200 MG PO CAPS: 200.0000 mg | ORAL_CAPSULE | Freq: Three times a day (TID) | ORAL | Status: DC | PRN

## 2016-05-13 NOTE — Telephone Encounter (Signed)
Ok for tessalon 200mg  tid prn Yes cut bp med in half or dc it altogther Yes set up with portable o2  Let her know will discuss more ov June 2017 including PFT results and blood work     Current outpatient prescriptions:  .  aspirin 325 MG tablet, Take 2 tablets as needed for pain., Disp: , Rfl:  .  benzonatate (TESSALON) 200 MG capsule, Take 1 capsule (200 mg total) by mouth 3 (three) times daily as needed for cough., Disp: 20 capsule, Rfl: 0 .  isosorbide-hydrALAZINE (BIDIL) 20-37.5 MG tablet, Take 1 tablet by mouth 2 (two) times daily., Disp: 60 tablet, Rfl: 1 .  predniSONE (DELTASONE) 20 MG tablet, Take 30 mg by mouth daily with breakfast. Take 30 mg daily until 5/20. On 5/20 start taking 20mg  daily, then on 5/27 start taking 10mg  daily, Disp: , Rfl:  .  Vitamin D, Ergocalciferol, (DRISDOL) 50000 UNITS CAPS, Take 50,000 Units by mouth every 7 (seven) days. Reported on 03/23/2016, Disp: , Rfl:   Current facility-administered medications:  .  albuterol (PROVENTIL) (2.5 MG/3ML) 0.083% nebulizer solution 2.5 mg, 2.5 mg, Nebulization, Once, Pearline CablesJessica C Copland, MD

## 2016-05-13 NOTE — Telephone Encounter (Signed)
LMTCB

## 2016-05-13 NOTE — Telephone Encounter (Signed)
Spoke with pt. She is aware of MR's recommendations. Rx for Tessalon and oxygen have been placed. Nothing further was needed.

## 2016-05-13 NOTE — Telephone Encounter (Signed)
Pt returning call to nurse.Julie Page ° °

## 2016-05-13 NOTE — Telephone Encounter (Signed)
Spoke with pt. She has several things that need to be addressed. Pt is requesting a refill on Tessalon. Her BP medication is making her BP run low, she is wanting to know if she can decrease this to once a day instead of twice a day. Pt's PCP was to be sending an email to MR about getting pt a more portable oxygen system for exertional use.  MR - please advise. Thanks.

## 2016-05-17 DIAGNOSIS — I272 Other secondary pulmonary hypertension: Secondary | ICD-10-CM | POA: Diagnosis not present

## 2016-05-17 DIAGNOSIS — J849 Interstitial pulmonary disease, unspecified: Secondary | ICD-10-CM | POA: Diagnosis not present

## 2016-05-17 DIAGNOSIS — I5032 Chronic diastolic (congestive) heart failure: Secondary | ICD-10-CM | POA: Diagnosis not present

## 2016-05-17 DIAGNOSIS — I7300 Raynaud's syndrome without gangrene: Secondary | ICD-10-CM | POA: Diagnosis not present

## 2016-05-17 DIAGNOSIS — D649 Anemia, unspecified: Secondary | ICD-10-CM | POA: Diagnosis not present

## 2016-05-17 DIAGNOSIS — R260 Ataxic gait: Secondary | ICD-10-CM | POA: Diagnosis not present

## 2016-05-18 DIAGNOSIS — I7300 Raynaud's syndrome without gangrene: Secondary | ICD-10-CM | POA: Diagnosis not present

## 2016-05-18 DIAGNOSIS — R260 Ataxic gait: Secondary | ICD-10-CM | POA: Diagnosis not present

## 2016-05-18 DIAGNOSIS — D649 Anemia, unspecified: Secondary | ICD-10-CM | POA: Diagnosis not present

## 2016-05-18 DIAGNOSIS — I272 Other secondary pulmonary hypertension: Secondary | ICD-10-CM | POA: Diagnosis not present

## 2016-05-18 DIAGNOSIS — I5032 Chronic diastolic (congestive) heart failure: Secondary | ICD-10-CM | POA: Diagnosis not present

## 2016-05-18 DIAGNOSIS — J849 Interstitial pulmonary disease, unspecified: Secondary | ICD-10-CM | POA: Diagnosis not present

## 2016-05-19 DIAGNOSIS — D649 Anemia, unspecified: Secondary | ICD-10-CM | POA: Diagnosis not present

## 2016-05-19 DIAGNOSIS — R260 Ataxic gait: Secondary | ICD-10-CM | POA: Diagnosis not present

## 2016-05-19 DIAGNOSIS — I7300 Raynaud's syndrome without gangrene: Secondary | ICD-10-CM | POA: Diagnosis not present

## 2016-05-19 DIAGNOSIS — J849 Interstitial pulmonary disease, unspecified: Secondary | ICD-10-CM | POA: Diagnosis not present

## 2016-05-19 DIAGNOSIS — I5032 Chronic diastolic (congestive) heart failure: Secondary | ICD-10-CM | POA: Diagnosis not present

## 2016-05-19 DIAGNOSIS — I272 Other secondary pulmonary hypertension: Secondary | ICD-10-CM | POA: Diagnosis not present

## 2016-05-23 ENCOUNTER — Telehealth (HOSPITAL_COMMUNITY): Payer: Self-pay | Admitting: Vascular Surgery

## 2016-05-23 NOTE — Telephone Encounter (Signed)
Pt called back  to schedule heart cath.. Please advise

## 2016-05-23 NOTE — Telephone Encounter (Signed)
Left message to call back  

## 2016-05-23 NOTE — Telephone Encounter (Signed)
Pt does not want to sch RHC at this time, she is seeing Dr Bonney Aidamaswamey back on Fri 6/9 and will discuss w/him then, gave our phone number so she can call us back if she wants to sch

## 2016-05-24 ENCOUNTER — Encounter: Payer: Self-pay | Admitting: Internal Medicine

## 2016-05-24 ENCOUNTER — Ambulatory Visit (INDEPENDENT_AMBULATORY_CARE_PROVIDER_SITE_OTHER): Payer: Medicare Other | Admitting: Internal Medicine

## 2016-05-24 VITALS — BP 124/66 | HR 104 | Ht 60.0 in | Wt 128.6 lb

## 2016-05-24 DIAGNOSIS — J84112 Idiopathic pulmonary fibrosis: Secondary | ICD-10-CM | POA: Diagnosis not present

## 2016-05-24 DIAGNOSIS — J9611 Chronic respiratory failure with hypoxia: Secondary | ICD-10-CM | POA: Diagnosis not present

## 2016-05-24 NOTE — Patient Instructions (Signed)
ICD-9-CM ICD-10-CM   1. IPF (idiopathic pulmonary fibrosis) (HCC) 516.31 J84.112   2. Chronic hypoxemic respiratory failure (HCC) 518.83 J96.11    799.02     Diagnosis is IPF   Plan contnue o2 treatment Discussed esbriet v ofev extensively   - respect desire to think more in detail and discuss with your sons   -call us back in 1 to 2 weeks with your decision  - my recommendation is for you try one of the drugs; we can do lower dose Definitely start pulmonary rehab Definitely coordinate to see Dr [name] cardoiologist  Followup  3 months or sooner if neeeded

## 2016-05-24 NOTE — Progress Notes (Signed)
Subjective:     Patient ID: Julie Page, female   DOB: December 19, 1951, 65 y.o.   MRN: [id number]  HPI   OV 04/28/2016  Chief Complaint  Patient presents with  . Follow-up    Pt here for HFU for pna and ILD. Pt states she is still occassionally having DOE, prod cough with clear mucus. Pt denies CP/tightness.    65 year old female presents with [place] on [facility] for evaluation of interstitial lung disease. History is gained from talking to her, her son review of the outside chart and old chart.  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 This building has mold in it and on and off ever since she started working that she started having respirations symptoms. In 2012 and 2013 she was evaluated by Julie Page in our institution. 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 There is also history of [name] and rheumatoid factor antibody. At that time pulmonary function test showed isolated low reduction in diffusion capacity that was mild. She only had dyspnea on climbing stairs at that point in time. She continued to be in stable health up until spring of 2017. During this time she continued to work in the building. In the interim she did follow-up with Julie Page at [facility] rheumatology. Pertinent lab tests are listed below. No systemic evidence of autoimmune disease was discovered and she was on serial follow-up.  Then most recently in April 2017 she had 2 weeks of respiratory exacerbation and then hospitalized for 1 week. CT scan of the chest at this point in time shows classic UIP pattern associated with diffuse groundglass opacity consistent with UIP flare. Significant progression since previous CT scan in 2012. She was discharged on steroid taper which she still is taking and 1 L oxygen. She is extremely physically  deconditioned. She spent some time in inpatient rehabilitation. She is now at home and is going to start home physical therapy. She notices that at room air at rest she would be 92% can be off oxygen for a few hours when she exerts she can drop into the 80s.  Echocardiogram at this point in time shows normal ejection fraction but elevated right ventricular and pulmonary artery systolic pressure to 40 mmHg.  She and her son have many questions pertaining to etiology, prognosis, treatment and outlook and disability application.    Autoimmune labs - Rheumatoid factor s 24 and in April 2013 was 59 in our health system. But with Julie Page at [facility] was 28 and 102, in 2014 in 2015 respectively - [name] was positive at 1 in April 2017 in our health system and was trace positive at 2.0 with Julie Page in September 2014. - No evidence that she's had CCP antibody tested for hypersensitivity panel tested -In September 2014 with Julie Page was trace positive but negative [name] scleroderma 70 SSB and double-stranded DNA antibodies.. Similar result in April 2017.  Waking desat test 185 feet x 3 laps on RA: desat to 86% in 1/2 to 1 lap and stopped    OV 05/24/2016  Chief Complaint  Patient presents with  . Follow-up    pt has not yet had R heart cath, just heard from cards yesterday.  pt has no complaints today.      Follow-up interstitial lung disease provisional  diagnosis of IPF. Presents with her son Julie Page discuss pulmonary function tests and autoimmune results  Last visit sent  CCP antibody and hypersensitivity pneumonitis profile and these were negative. We did pulmonary function test. She could not do her DLCO but FVC is 54% and a moderate severity. Currently she feels physically stronger but still very deconditioned from her admission. She is able to time 1 flight of stairs 1 L oxygen and desaturates to 88% especially when she coughs. Cough is still a problem for which  she is on palliative support with Tessalon. She has now got a call from pulmonary rehabilitation and she plans to start that. She is working with cardiology to get an appointment to see Julie Page for right heart cath. She has not decided about anti-fibrotic therapy. She is very afraid of the side effects. She says she is very sensitive. She is worried about [name] because of sunscreen and she is worried about [name] because of potential cardiac risk. She feels that she wants to get stronger before she can try the medications. Although I cautioned her that IPF is unpredictable and can progress aggressively especially in the first after flare up. Her son Julie Page is in favor of taking her medications but she still not sure we discussed this extensively. She wants to consult with her other son   Review of Systems     Objective:   Physical Exam  Filed Vitals:   05/24/16 1336  BP: 124/66  Pulse: 104  Height: 5' (1.524 m)  Weight: 128 lb 9.6 oz (58.333 kg)  SpO2: 95%   1L [place]  Estimated body mass index is 25.12 kg/(m^2) as calculated from the following:   Height as of this encounter: 5' (1.524 m).   Weight as of this encounter: 128 lb 9.6 oz (58.333 kg).   Mainly discussion visit. Brief exam shows that she is a frail female sitting on a wheelchair. Oxygen on. Bilateral bibasal crackles present normal heart sounds. No edema or only mild edema. Flat affect present.     Assessment:       ICD-9-CM ICD-10-CM   1. IPF (idiopathic pulmonary fibrosis) (HCC) 516.31 J84.112   2. Chronic hypoxemic respiratory failure (HCC) 518.83 J96.11    799.02         Plan:      Diagnosis is IPF   Plan contnue o2 treatment Discussed esbriet v ofev extensively   - respect desire to think more in detail and discuss with your sons   -call us back in 1 to 2 weeks with your decision  - my recommendation is for you try one of the drugs; we can do lower dose Definitely start pulmonary  rehab Definitely coordinate to see Julie Page cardoiologist  Followup  3 months or sooner if neeeded   > 50% of this > 25 min visit spent in face to face counseling or coordination of care   Julie. Kalman ShanMurali Zaquan Duffner, M.D., [facility] Center[facility].C.P Pulmonary and Critical Care Medicine Staff Physician [facility] [place] [facility] Critical [facility] Pager: [phone], If no answer or between  15:00h - 7:00h: call [phone]  05/24/2016 2:13 PM

## 2016-05-27 ENCOUNTER — Ambulatory Visit: Payer: Medicare Other | Admitting: Internal Medicine

## 2016-05-27 ENCOUNTER — Telehealth (HOSPITAL_COMMUNITY): Payer: Self-pay | Admitting: Surgery

## 2016-05-27 NOTE — Telephone Encounter (Signed)
Patient requesting a heart cath be scheduled.  Forwarded to [facility] Coordinator to return patient call.

## 2016-05-30 ENCOUNTER — Telehealth (HOSPITAL_COMMUNITY): Payer: Self-pay | Admitting: Vascular Surgery

## 2016-05-30 NOTE — Telephone Encounter (Signed)
Pt called to schedule heart cath

## 2016-05-30 NOTE — Telephone Encounter (Signed)
May go ahead and schedule RHC for me the week I am back from vacation and office visit afterwards if she has pulmonary hypertension, or if she prefers I could see her in the office first.

## 2016-05-30 NOTE — Telephone Encounter (Signed)
Will forward to Dr. [name] to see if he would like to see patient in CHF clinic first or proceed with heart cath without OV  Ave FilterBradley, Megan Genevea

## 2016-05-31 ENCOUNTER — Telehealth (HOSPITAL_COMMUNITY): Payer: Self-pay | Admitting: Vascular Surgery

## 2016-05-31 ENCOUNTER — Other Ambulatory Visit (HOSPITAL_COMMUNITY): Payer: Self-pay | Admitting: *Deleted

## 2016-05-31 DIAGNOSIS — I272 Pulmonary hypertension, unspecified: Secondary | ICD-10-CM

## 2016-05-31 NOTE — Telephone Encounter (Signed)
Pt called to make cath appt .Marland Kitchen. Please advise

## 2016-05-31 NOTE — Telephone Encounter (Signed)
Spoke w/pt, cath is sch for Wed 6/28 at 8:30, instructions reviewed w/pt, she is aware, agreeable and verbalizes understanding.

## 2016-06-03 ENCOUNTER — Encounter (HOSPITAL_COMMUNITY): Payer: Self-pay

## 2016-06-03 ENCOUNTER — Telehealth: Payer: Self-pay | Admitting: *Deleted

## 2016-06-03 ENCOUNTER — Encounter (HOSPITAL_COMMUNITY)
Admission: RE | Admit: 2016-06-03 | Discharge: 2016-06-03 | Disposition: A | Discharge status: Home / Self Care | Payer: Medicare Other | Source: Ambulatory Visit | Attending: Internal Medicine | Admitting: Internal Medicine

## 2016-06-03 DIAGNOSIS — J849 Interstitial pulmonary disease, unspecified: Secondary | ICD-10-CM | POA: Insufficient documentation

## 2016-06-03 DIAGNOSIS — I5032 Chronic diastolic (congestive) heart failure: Secondary | ICD-10-CM | POA: Insufficient documentation

## 2016-06-03 DIAGNOSIS — Z79899 Other long term (current) drug therapy: Secondary | ICD-10-CM | POA: Insufficient documentation

## 2016-06-03 DIAGNOSIS — E7800 Pure hypercholesterolemia, unspecified: Secondary | ICD-10-CM | POA: Insufficient documentation

## 2016-06-03 DIAGNOSIS — I7300 Raynaud's syndrome without gangrene: Secondary | ICD-10-CM | POA: Diagnosis not present

## 2016-06-03 DIAGNOSIS — Z7982 Long term (current) use of aspirin: Secondary | ICD-10-CM | POA: Insufficient documentation

## 2016-06-03 NOTE — Telephone Encounter (Signed)
Forwarded to Dr.Copland. JG//CMA  

## 2016-06-03 NOTE — Progress Notes (Signed)
Julie Page 65 y.o. female Pulmonary Rehab Orientation Note Patient arrived today in Cardiac and Pulmonary Rehab for orientation to Pulmonary Rehab. [facility] [employer] via wheel chair by her son. She does carry portable oxygen. Per pt, she uses oxygen continuously at 1 lpm.Color good, skin warm and dry. Patient is oriented to time and place. [name] medical history, psychosocial health, and medications reviewed. Psychosocial assessment reveals pt lives with their son. Pt is currently retired. Pt hobbies include reading. Pt reports her stress level is low. Areas of stress/anxiety include Health. She has been newly diagnosis with IPF during a hospitalization in March, and questions her life expectancy. Pt does exhibits signs of depression. Signs of depression include sadness. PHQ2/9 score 2/4. Pt shows fair  coping skills with questionable outlook . She was offered emotional support and reassurance and given information on IPF. Physical assessment reveals heart rate is normal, breath sounds clear to auscultation, no wheezes, or rhonchi. Fine crackles in the bases bilat. Grip strength equal, strong. Distal pulses palpable. No edema noted Patient reports she does take medications as prescribed. Patient states she follows a Regular diet. The patient reports no specific efforts to gain or lose weight. [name] weight will be monitored closely. Demonstration and practice of PLB using pulse oximeter. Patient able to return demonstration satisfactorily. Safety and hand hygiene in the exercise area reviewed with patient. Patient voices understanding of the information reviewed. Department expectations discussed with patient and achievable goals were set. The patient shows enthusiasm about attending the program and we look forward to working with this nice lady. The patient is scheduled for a 6 min walk test on Thursday 6/22 and to begin exercise on Thursday 6/29 in the 1:30 class.    45  minutes was spent on a variety of activities such as assessment of the patient, obtaining baseline data including height, weight, BMI, and grip strength, verifying medical history, allergies, and current medications, and teaching patient strategies for performing tasks with less respiratory effort with emphasis on pursed lip breathing.

## 2016-06-06 NOTE — Telephone Encounter (Signed)
As a duplicate message was open for the same request, patient was already scheduled for a cath with Dr [name] on 6/28 However when I called to "arrange" procedure with her, patient requested to cancel cath Patient reports she would like to see provider prior to procedure and would like to speak to dr. Bonney Aidamaswamey again.  NP appt for Dr [name] 7/26 @ 1140, instructions to office along with parking instructions and garage code provided to patient, new patient packet mailed and cath cancelled via Clydie BraunKaren in cath lab

## 2016-06-09 ENCOUNTER — Encounter (HOSPITAL_COMMUNITY)
Admission: RE | Admit: 2016-06-09 | Discharge: 2016-06-09 | Disposition: A | Discharge status: Home / Self Care | Payer: Medicare Other | Source: Ambulatory Visit | Attending: Internal Medicine | Admitting: Internal Medicine

## 2016-06-09 DIAGNOSIS — J849 Interstitial pulmonary disease, unspecified: Secondary | ICD-10-CM

## 2016-06-09 DIAGNOSIS — I5032 Chronic diastolic (congestive) heart failure: Secondary | ICD-10-CM | POA: Diagnosis not present

## 2016-06-09 DIAGNOSIS — Z7982 Long term (current) use of aspirin: Secondary | ICD-10-CM | POA: Diagnosis not present

## 2016-06-09 DIAGNOSIS — I7300 Raynaud's syndrome without gangrene: Secondary | ICD-10-CM | POA: Diagnosis not present

## 2016-06-09 DIAGNOSIS — Z79899 Other long term (current) drug therapy: Secondary | ICD-10-CM | POA: Diagnosis not present

## 2016-06-09 DIAGNOSIS — E7800 Pure hypercholesterolemia, unspecified: Secondary | ICD-10-CM | POA: Diagnosis not present

## 2016-06-10 NOTE — Progress Notes (Signed)
Pulmonary Individual Treatment Plan  Patient Details  Name: Julie Page MRN: [id number] Date of Birth: Oct 05, 1951 Referring Provider:        Pulmonary Rehab Walk Test from 06/09/2016 in [facility] CARDIAC [facility]   Referring Provider  Dr. Marchelle Gearing      Initial Encounter Date:       Pulmonary Rehab Walk Test from 06/09/2016 in [facility] CARDIAC REHAB   Date  06/10/16   Referring Provider  Dr. Marchelle Gearing      Visit Diagnosis: Interstitial lung disease (HCC)  [name] Home Medications on Admission:   Current outpatient prescriptions:  .  aspirin 325 MG tablet, Take 2 tablets as needed for pain., Disp: , Rfl:  .  benzonatate (TESSALON) 200 MG capsule, Take 1 capsule (200 mg total) by mouth 3 (three) times daily as needed for cough., Disp: 20 capsule, Rfl: 0 .  predniSONE (DELTASONE) 20 MG tablet, Take 10 mg by mouth daily with breakfast. , Disp: , Rfl:   Current facility-administered medications:  .  albuterol (PROVENTIL) (2.5 MG/3ML) 0.083% nebulizer solution 2.5 mg, 2.5 mg, Nebulization, Once, Pearline Cables, MD  Past Medical History: Past Medical History  Diagnosis Date  . Raynaud's syndrome   . Hypercholesteremia   . CAP (community acquired pneumonia)   . Anemia     Chronic disease  . Chronic diastolic heart failure (HCC)   . Hypoxia   . ILD (interstitial lung disease) (HCC)   . Malnutrition of moderate degree (HCC)   . Pulmonary HTN (HCC)   . SIRS (systemic inflammatory response syndrome) (HCC)   . Acute respiratory failure with hypoxia (HCC)   . Leukocytosis   . Palpitations   . Protein calorie malnutrition (HCC)     Tobacco Use: History  Smoking status  . Never Smoker   Smokeless tobacco  . Never Used    Labs: Recent Review Flowsheet Data    There is no flowsheet data to display.      Capillary Blood Glucose: No results found for: GLUCAP   ADL UCSD:   Pulmonary Function Assessment:     Pulmonary  Function Assessment - 06/03/16 1620    Breath   Bilateral Breath Sounds Other  fine crackles on the lower bases, otherwise clear   Shortness of Breath Yes;Fear of Shortness of Breath;Limiting activity      Exercise Target Goals: Date: 06/10/16  Exercise Program Goal: Individual exercise prescription set with THRR, safety & activity barriers. Participant demonstrates ability to understand and report RPE using BORG scale, to self-measure pulse accurately, and to acknowledge the importance of the exercise prescription.  Exercise Prescription Goal: Starting with aerobic activity 30 plus minutes a day, 3 days per week for initial exercise prescription. Provide home exercise prescription and guidelines that participant acknowledges understanding prior to discharge.  Activity Barriers & Risk Stratification:     Activity Barriers & Cardiac Risk Stratification - 06/03/16 1619    Activity Barriers & Cardiac Risk Stratification   Activity Barriers Deconditioning;Shortness of Breath;Assistive Device      6 Minute Walk:     6 Minute Walk      06/10/16 0826       6 Minute Walk   Phase Initial     Distance 800 feet     Walk Time 6 minutes     # of Rest Breaks 0     MPH 1.5     METS 2.15     RPE 12  Perceived Dyspnea  1     Symptoms No     Resting HR 101 bpm     Resting BP 110/64 mmHg     Max Ex. HR 117 bpm     Max Ex. BP 98/60 mmHg     Interval HR   Baseline HR 101     1 Minute HR 109     2 Minute HR 115     3 Minute HR 117     4 Minute HR 109     5 Minute HR 108     6 Minute HR 107     2 Minute Post HR 107     Interval Heart Rate? Yes     Interval Oxygen   Baseline Oxygen Saturation % 98 %     Baseline Liters of Oxygen 1 L     1 Minute Oxygen Saturation % 98 %     1 Minute Liters of Oxygen 1 L     2 Minute Oxygen Saturation % 93 %     2 Minute Liters of Oxygen 1 L     3 Minute Oxygen Saturation % 91 %     3 Minute Liters of Oxygen 1 L     4 Minute Oxygen  Saturation % 93 %     4 Minute Liters of Oxygen 1 L     5 Minute Oxygen Saturation % 93 %     5 Minute Liters of Oxygen 1 L     6 Minute Oxygen Saturation % 94 %     6 Minute Liters of Oxygen 1 L     2 Minute Post Oxygen Saturation % 99 %     2 Minute Post Liters of Oxygen 1 L        Initial Exercise Prescription:     Initial Exercise Prescription - 06/10/16 0800    Date of Initial Exercise RX and Referring Provider   Date 06/10/16   Referring Provider Dr. Marchelle Gearingamaswamy   Oxygen   Oxygen Continuous   Liters 1   Prescription Details   Frequency (times per week) 2   Duration Progress to 45 minutes of aerobic exercise without signs/symptoms of physical distress   Intensity   THRR 40-80% of Max Heartrate 62-124   Ratings of Perceived Exertion 11-13   Perceived Dyspnea 0-4   Progression   Progression Continue progressive overload as per policy without signs/symptoms or physical distress.   Resistance Training   Training Prescription Yes   Weight Orange bands   Reps 10-12      Perform Capillary Blood Glucose checks as needed.  Exercise Prescription Changes:   Exercise Comments:   Discharge Exercise Prescription (Final Exercise Prescription Changes):    Nutrition:  Target Goals: Understanding of nutrition guidelines, daily intake of sodium 1500mg , cholesterol 200mg , calories 30% from fat and 7% or less from saturated fats, daily to have 5 or more servings of fruits and vegetables.  Biometrics:    Nutrition Therapy Plan and Nutrition Goals:   Nutrition Discharge: Rate Your Plate Scores:   Psychosocial: Target Goals: Acknowledge presence or absence of depression, maximize coping skills, provide positive support system. Participant is able to verbalize types and ability to use techniques and skills needed for reducing stress and depression.  Initial Review & Psychosocial Screening:     Initial Psych Review & Screening - 06/03/16 1623    Family Dynamics   Good  Support System? Yes   Screening Interventions   Interventions Encouraged  to exercise      Quality of Life Scores:   PHQ-9:     Recent Review Flowsheet Data    Depression screen [facility] 2/9 06/03/2016 03/13/2016   Decreased Interest 1 0   Down, Depressed, Hopeless 1 0   PHQ - 2 Score 2 0   Altered sleeping 0 -   Tired, decreased energy 1 -   Change in appetite 0 -   Feeling bad or failure about yourself  1 -   Trouble concentrating 0 -   Moving slowly or fidgety/restless 0 -   Suicidal thoughts 0 -   PHQ-9 Score 4 -   Difficult doing work/chores Not difficult at all -      Psychosocial Evaluation and Intervention:   Psychosocial Re-Evaluation:  Education: Education Goals: Education classes will be provided on a weekly basis, covering required topics. Participant will state understanding/return demonstration of topics presented.  Learning Barriers/Preferences:     Learning Barriers/Preferences - 06/03/16 1620    Learning Barriers/Preferences   Learning Barriers None   Learning Preferences Group Instruction;Individual Instruction;Written Material      Education Topics: Risk Factor Reduction:  -Group instruction that is supported by a PowerPoint presentation. Instructor discusses the definition of a risk factor, different risk factors for pulmonary disease, and how the heart and lungs work together.     Nutrition for Pulmonary Patient:  -Group instruction provided by PowerPoint slides, verbal discussion, and written materials to support subject matter. The instructor gives an explanation and review of healthy diet recommendations, which includes a discussion on weight management, recommendations for fruit and vegetable consumption, as well as protein, fluid, caffeine, fiber, sodium, sugar, and alcohol. Tips for eating when patients are short of breath are discussed.   Pursed Lip Breathing:  -Group instruction that is supported by demonstration and informational  handouts. Instructor discusses the benefits of pursed lip and diaphragmatic breathing and detailed demonstration on how to preform both.     Oxygen Safety:  -Group instruction provided by PowerPoint, verbal discussion, and written material to support subject matter. There is an overview of "What is Oxygen" and "Why do we need it".  Instructor also reviews how to create a safe environment for oxygen use, the importance of using oxygen as prescribed, and the risks of noncompliance. There is a brief discussion on traveling with oxygen and resources the patient may utilize.   Oxygen Equipment:  -Group instruction provided by [facility] Staff utilizing handouts, written materials, and equipment demonstrations.   Signs and Symptoms:  -Group instruction provided by written material and verbal discussion to support subject matter. Warning signs and symptoms of infection, stroke, and heart attack are reviewed and when to call the physician/911 reinforced. Tips for preventing the spread of infection discussed.   Advanced Directives:  -Group instruction provided by verbal instruction and written material to support subject matter. Instructor reviews Advanced Directive laws and proper instruction for filling out document.   Pulmonary Video:  -Group video education that reviews the importance of medication and oxygen compliance, exercise, good nutrition, pulmonary hygiene, and pursed lip and diaphragmatic breathing for the pulmonary patient.   Exercise for the Pulmonary Patient:  -Group instruction that is supported by a PowerPoint presentation. Instructor discusses benefits of exercise, core components of exercise, frequency, duration, and intensity of an exercise routine, importance of utilizing pulse oximetry during exercise, safety while exercising, and options of places to exercise outside of rehab.     Pulmonary Medications:  -Verbally interactive group education provided  by instructor with  focus on inhaled medications and proper administration.   Anatomy and Physiology of the Respiratory System and Intimacy:  -Group instruction provided by PowerPoint, verbal discussion, and written material to support subject matter. Instructor reviews respiratory cycle and anatomical components of the respiratory system and their functions. Instructor also reviews differences in obstructive and restrictive respiratory diseases with examples of each. Intimacy, Sex, and Sexuality differences are reviewed with a discussion on how relationships can change when diagnosed with pulmonary disease. Common sexual concerns are reviewed.   Knowledge Questionnaire Score:   Core Components/Risk Factors/Patient Goals at Admission:     Personal Goals and Risk Factors at Admission - 06/03/16 1621    Core Components/Risk Factors/Patient Goals on Admission   Increase Strength and Stamina Yes   Intervention Provide advice, education, support and counseling about physical activity/exercise needs.;Develop an individualized exercise prescription for aerobic and resistive training based on initial evaluation findings, risk stratification, comorbidities and participant's personal goals.   Expected Outcomes Achievement of increased cardiorespiratory fitness and enhanced flexibility, muscular endurance and strength shown through measurements of functional capacity and personal statement of participant.   Improve shortness of breath with ADL's Yes   Intervention Provide education, individualized exercise plan and daily activity instruction to help decrease symptoms of SOB with activities of daily living.   Expected Outcomes Short Term: Achieves a reduction of symptoms when performing activities of daily living.   Develop more efficient breathing techniques such as purse lipped breathing and diaphragmatic breathing; and practicing self-pacing with activity Yes   Intervention Provide education, demonstration and support  about specific breathing techniuqes utilized for more efficient breathing. Include techniques such as pursed lipped breathing, diaphragmatic breathing and self-pacing activity.   Expected Outcomes Short Term: Participant will be able to demonstrate and use breathing techniques as needed throughout daily activities.   Increase knowledge of respiratory medications and ability to use respiratory devices properly  Yes   Intervention Provide education and demonstration as needed of appropriate use of medications, inhalers, and oxygen therapy.   Expected Outcomes Short Term: Achieves understanding of medications use. Understands that oxygen is a medication prescribed by physician. Demonstrates appropriate use of inhaler and oxygen therapy.      Core Components/Risk Factors/Patient Goals Review:    Core Components/Risk Factors/Patient Goals at Discharge (Final Review):    ITP Comments:   Comments:

## 2016-06-15 ENCOUNTER — Ambulatory Visit (HOSPITAL_COMMUNITY): Admission: RE | Admit: 2016-06-15 | Payer: Medicare Other | Source: Ambulatory Visit | Admitting: Cardiology

## 2016-06-15 ENCOUNTER — Encounter (HOSPITAL_COMMUNITY): Admission: RE | Payer: Self-pay | Source: Ambulatory Visit

## 2016-06-15 SURGERY — RIGHT HEART CATH
Anesthesia: LOCAL

## 2016-06-16 ENCOUNTER — Encounter (HOSPITAL_COMMUNITY)
Admission: RE | Admit: 2016-06-16 | Discharge: 2016-06-16 | Disposition: A | Discharge status: Home / Self Care | Payer: Medicare Other | Source: Ambulatory Visit | Attending: Internal Medicine | Admitting: Internal Medicine

## 2016-06-16 VITALS — Wt 126.8 lb

## 2016-06-16 DIAGNOSIS — E7800 Pure hypercholesterolemia, unspecified: Secondary | ICD-10-CM | POA: Diagnosis not present

## 2016-06-16 DIAGNOSIS — Z7982 Long term (current) use of aspirin: Secondary | ICD-10-CM | POA: Diagnosis not present

## 2016-06-16 DIAGNOSIS — Z79899 Other long term (current) drug therapy: Secondary | ICD-10-CM | POA: Diagnosis not present

## 2016-06-16 DIAGNOSIS — J849 Interstitial pulmonary disease, unspecified: Secondary | ICD-10-CM

## 2016-06-16 DIAGNOSIS — I7300 Raynaud's syndrome without gangrene: Secondary | ICD-10-CM | POA: Diagnosis not present

## 2016-06-16 DIAGNOSIS — I5032 Chronic diastolic (congestive) heart failure: Secondary | ICD-10-CM | POA: Diagnosis not present

## 2016-06-16 NOTE — Progress Notes (Signed)
Daily Session Note  Patient Details  Name: Julie Page MRN: [id number] Date of Birth: 1951/11/29 Referring Provider:        Pulmonary Rehab Walk Test from 06/09/2016 in [place]   Referring Provider  Dr. Chase Caller      Encounter Date: 06/16/2016  Check In:     Session Check In - 06/16/16 1714    Check-In   Location [facility]   Staff Present Rosebud Poles, RN, BSN;[name] diVincenzo, MS, ACSM RCEP, Exercise Physiologist;Lisa Ysidro Evert, RN   Supervising physician immediately available to respond to emergencies [facility] immediately available   Physician(s) Dr. Waldron Labs   Medication changes reported     No   Fall or balance concerns reported    No   Warm-up and Cool-down Performed as group-led instruction   Resistance Training Performed Yes   VAD Patient? No   Pain Assessment   Currently in Pain? No/denies   Multiple Pain Sites No      Capillary Blood Glucose: No results found for this or any previous visit (from the past 24 hour(s)).      Exercise Prescription Changes - 06/16/16 1700    Response to Exercise   Blood Pressure (Admit) 120/60 mmHg   Blood Pressure (Exercise) 112/70 mmHg   Blood Pressure (Exit) 110/60 mmHg   Heart Rate (Admit) 97 bpm   Heart Rate (Exercise) 96 bpm   Heart Rate (Exit) 89 bpm   Oxygen Saturation (Admit) 98 %   Oxygen Saturation (Exercise) 96 %   Oxygen Saturation (Exit) 96 %   Rating of Perceived Exertion (Exercise) 14   Perceived Dyspnea (Exercise) 2   Duration Progress to 45 minutes of aerobic exercise without signs/symptoms of physical distress   Intensity THRR New   Resistance Training   Training Prescription Yes   Weight Orange bands   Reps 10-12   Interval Training   Interval Training No   Oxygen   Oxygen Continuous   Liters 1   Arm Ergometer   Level 1   Minutes 17   Track   Laps 4   Minutes 17     Goals Met:  Exercise tolerated well Queuing for purse lip  breathing Strength training completed today  Goals Unmet:  Not Applicable  Comments: Service time is from 1330 to 1600    Dr. Rush Farmer is Medical Director for Pulmonary Rehab at [facility].

## 2016-06-20 ENCOUNTER — Telehealth: Payer: Self-pay | Admitting: *Deleted

## 2016-06-20 NOTE — Telephone Encounter (Signed)
Forwarded to Dr.Copland. JG//CMA  

## 2016-06-21 ENCOUNTER — Encounter (HOSPITAL_COMMUNITY): Payer: Medicare Other

## 2016-06-23 ENCOUNTER — Encounter (HOSPITAL_COMMUNITY)
Admission: RE | Admit: 2016-06-23 | Discharge: 2016-06-23 | Disposition: A | Discharge status: Home / Self Care | Payer: Medicare Other | Source: Ambulatory Visit | Attending: Internal Medicine | Admitting: Internal Medicine

## 2016-06-23 VITALS — Wt 127.0 lb

## 2016-06-23 DIAGNOSIS — I7300 Raynaud's syndrome without gangrene: Secondary | ICD-10-CM | POA: Insufficient documentation

## 2016-06-23 DIAGNOSIS — I5032 Chronic diastolic (congestive) heart failure: Secondary | ICD-10-CM | POA: Diagnosis not present

## 2016-06-23 DIAGNOSIS — Z79899 Other long term (current) drug therapy: Secondary | ICD-10-CM | POA: Diagnosis not present

## 2016-06-23 DIAGNOSIS — E7800 Pure hypercholesterolemia, unspecified: Secondary | ICD-10-CM | POA: Diagnosis not present

## 2016-06-23 DIAGNOSIS — J849 Interstitial pulmonary disease, unspecified: Secondary | ICD-10-CM

## 2016-06-23 DIAGNOSIS — Z7982 Long term (current) use of aspirin: Secondary | ICD-10-CM | POA: Diagnosis not present

## 2016-06-23 NOTE — Progress Notes (Signed)
Daily Session Note  Patient Details  Name: Julie Page MRN: [id number] Date of Birth: 04-25-1951 Referring Provider:        Pulmonary Rehab Walk Test from 06/09/2016 in [place]   Referring Provider  Dr. Chase Caller      Encounter Date: 06/23/2016  Check In:     Session Check In - 06/23/16 1356    Check-In   Location [facility]   Staff Present Rosebud Poles, RN, BSN;Lisa Ysidro Evert, Felipe Drone, RN, MHA;[name] diVincenzo, MS, ACSM RCEP, Exercise Physiologist   Supervising physician immediately available to respond to emergencies [facility] immediately available   Physician(s) Dr. Cruzita Lederer   Medication changes reported     No   Fall or balance concerns reported    No   Warm-up and Cool-down Performed as group-led instruction   Resistance Training Performed Yes   VAD Patient? No   Pain Assessment   Currently in Pain? No/denies   Multiple Pain Sites No      Capillary Blood Glucose: No results found for this or any previous visit (from the past 24 hour(s)).      Exercise Prescription Changes - 06/23/16 1600    Response to Exercise   Blood Pressure (Admit) 120/70 mmHg   Blood Pressure (Exercise) 140/76 mmHg   Blood Pressure (Exit) 110/70 mmHg   Heart Rate (Admit) 87 bpm   Heart Rate (Exercise) 89 bpm   Heart Rate (Exit) 93 bpm   Oxygen Saturation (Admit) 98 %   Oxygen Saturation (Exercise) 94 %   Oxygen Saturation (Exit) 91 %   Rating of Perceived Exertion (Exercise) 15   Perceived Dyspnea (Exercise) 2   Duration Progress to 45 minutes of aerobic exercise without signs/symptoms of physical distress   Intensity THRR New   Resistance Training   Training Prescription Yes   Weight Orange bands   Reps 10-12   Interval Training   Interval Training No   Oxygen   Oxygen Continuous   Liters 1   Arm Ergometer   Level 1   Minutes 17   Track   Laps 7   Minutes 17     Goals Met:  Exercise tolerated  well Strength training completed today  Goals Unmet:  Not Applicable  Comments: Service time is from 1330 to 1530    Dr. Rush Farmer is Medical Director for Pulmonary Rehab at [facility].

## 2016-06-28 ENCOUNTER — Encounter (HOSPITAL_COMMUNITY)
Admission: RE | Admit: 2016-06-28 | Discharge: 2016-06-28 | Disposition: A | Discharge status: Home / Self Care | Payer: Medicare Other | Source: Ambulatory Visit | Attending: Internal Medicine | Admitting: Internal Medicine

## 2016-06-28 VITALS — Wt 127.2 lb

## 2016-06-28 DIAGNOSIS — J849 Interstitial pulmonary disease, unspecified: Secondary | ICD-10-CM

## 2016-06-28 DIAGNOSIS — Z79899 Other long term (current) drug therapy: Secondary | ICD-10-CM | POA: Diagnosis not present

## 2016-06-28 DIAGNOSIS — I5032 Chronic diastolic (congestive) heart failure: Secondary | ICD-10-CM | POA: Diagnosis not present

## 2016-06-28 DIAGNOSIS — Z7982 Long term (current) use of aspirin: Secondary | ICD-10-CM | POA: Diagnosis not present

## 2016-06-28 DIAGNOSIS — E7800 Pure hypercholesterolemia, unspecified: Secondary | ICD-10-CM | POA: Diagnosis not present

## 2016-06-28 DIAGNOSIS — I7300 Raynaud's syndrome without gangrene: Secondary | ICD-10-CM | POA: Diagnosis not present

## 2016-06-28 NOTE — Progress Notes (Signed)
Daily Session Note  Patient Details  Name: Julie Page MRN: [id number] Date of Birth: 28-Jun-1951 Referring Provider:        Pulmonary Rehab Walk Test from 06/09/2016 in [place]   Referring Provider  Dr. Chase Caller      Encounter Date: 06/28/2016  Check In:     Session Check In - 06/28/16 1336    Check-In   Location [facility]   Staff Present Rosebud Poles, RN, BSN;Lisa Ysidro Evert, RN;Portia Rollene Rotunda, RN, BSN;Ramon Dredge, RN, [facility]   Supervising physician immediately available to respond to emergencies [facility] immediately available   Physician(s) Dr. Marily Memos   Medication changes reported     No   Fall or balance concerns reported    No   Warm-up and Cool-down Performed as group-led instruction   Resistance Training Performed Yes   VAD Patient? No   Pain Assessment   Currently in Pain? No/denies   Multiple Pain Sites No      Capillary Blood Glucose: No results found for this or any previous visit (from the past 24 hour(s)).      Exercise Prescription Changes - 06/28/16 1600    Response to Exercise   Blood Pressure (Admit) 98/54 mmHg   Blood Pressure (Exercise) 126/70 mmHg   Blood Pressure (Exit) 100/56 mmHg   Heart Rate (Admit) 90 bpm   Heart Rate (Exercise) 93 bpm   Heart Rate (Exit) 80 bpm   Oxygen Saturation (Admit) 97 %   Oxygen Saturation (Exercise) 94 %   Oxygen Saturation (Exit) 98 %   Rating of Perceived Exertion (Exercise) 15   Perceived Dyspnea (Exercise) 1   Duration Progress to 45 minutes of aerobic exercise without signs/symptoms of physical distress   Intensity THRR New   Resistance Training   Training Prescription Yes   Weight Orange bands   Reps 10-12   Interval Training   Interval Training No   Oxygen   Oxygen Continuous   Liters 1   NuStep   Level 1   Minutes 17   METs 1.7   Arm Ergometer   Level 1   Minutes 17   Track   Laps 8   Minutes 17     Goals Met:  Exercise  tolerated well Strength training completed today  Goals Unmet:  Not Applicable  Comments: Service time is from 1330 to 1515    Dr. Rush Farmer is Medical Director for Pulmonary Rehab at [facility].

## 2016-06-30 ENCOUNTER — Encounter (HOSPITAL_COMMUNITY)
Admission: RE | Admit: 2016-06-30 | Discharge: 2016-06-30 | Disposition: A | Discharge status: Home / Self Care | Payer: Medicare Other | Source: Ambulatory Visit | Attending: Internal Medicine | Admitting: Internal Medicine

## 2016-06-30 VITALS — Wt 126.5 lb

## 2016-06-30 DIAGNOSIS — E7800 Pure hypercholesterolemia, unspecified: Secondary | ICD-10-CM | POA: Diagnosis not present

## 2016-06-30 DIAGNOSIS — I7300 Raynaud's syndrome without gangrene: Secondary | ICD-10-CM | POA: Diagnosis not present

## 2016-06-30 DIAGNOSIS — Z79899 Other long term (current) drug therapy: Secondary | ICD-10-CM | POA: Diagnosis not present

## 2016-06-30 DIAGNOSIS — J849 Interstitial pulmonary disease, unspecified: Secondary | ICD-10-CM | POA: Diagnosis not present

## 2016-06-30 DIAGNOSIS — Z7982 Long term (current) use of aspirin: Secondary | ICD-10-CM | POA: Diagnosis not present

## 2016-06-30 DIAGNOSIS — I5032 Chronic diastolic (congestive) heart failure: Secondary | ICD-10-CM | POA: Diagnosis not present

## 2016-06-30 NOTE — Progress Notes (Signed)
Daily Session Note  Patient Details  Name: Julie Page MRN: [id number] Date of Birth: Sep 23, 1951 Referring Provider:        Pulmonary Rehab Walk Test from 06/09/2016 in [place]   Referring Provider  Dr. Chase Caller      Encounter Date: 06/30/2016  Check In:     Session Check In - 06/30/16 1539    Check-In   Location [facility]   Staff Present Rosebud Poles, RN, BSN;Senica Crall Ysidro Evert, RN;Portia Rollene Rotunda, RN, BSN   Supervising physician immediately available to respond to emergencies [facility] immediately available   Physician(s) Dr. Marthenia Rolling   Medication changes reported     No   Fall or balance concerns reported    No   Warm-up and Cool-down Performed as group-led instruction   Resistance Training Performed Yes   VAD Patient? No   Pain Assessment   Multiple Pain Sites No      Capillary Blood Glucose: No results found for this or any previous visit (from the past 24 hour(s)).      Exercise Prescription Changes - 06/30/16 1500    Exercise Review   Progression Yes   Response to Exercise   Blood Pressure (Admit) 132/70 mmHg   Blood Pressure (Exercise) 130/70 mmHg   Blood Pressure (Exit) 106/50 mmHg   Heart Rate (Admit) 93 bpm   Heart Rate (Exercise) 86 bpm   Heart Rate (Exit) 84 bpm   Oxygen Saturation (Admit) 98 %   Oxygen Saturation (Exercise) 91 %   Oxygen Saturation (Exit) 98 %   Rating of Perceived Exertion (Exercise) 13   Perceived Dyspnea (Exercise) 2   Duration Progress to 45 minutes of aerobic exercise without signs/symptoms of physical distress   Intensity THRR unchanged   Progression   Progression Continue to progress workloads to maintain intensity without signs/symptoms of physical distress.   Resistance Training   Training Prescription Yes   Weight orange bands   Reps 10-12   Interval Training   Interval Training No   Oxygen   Oxygen Continuous   Liters 1   NuStep   Level 1   Minutes 17   METs 1.6   Arm Ergometer   Level 1   Minutes 17     Goals Met:  Exercise tolerated well No report of cardiac concerns or symptoms Strength training completed today  Goals Unmet:  Not Applicable  Comments: Service time is from 1330 to 1530    Dr. Rush Farmer is Medical Director for Pulmonary Rehab at [facility].

## 2016-06-30 NOTE — Progress Notes (Signed)
Pulmonary Individual Treatment Plan  Patient Details  Name: Julie Page MRN: [id number] Date of Birth: 10-22-1951 Referring Provider:        Pulmonary Rehab Walk Test from 06/09/2016 in [facility]ARDI[facility]   Referring Provider  Dr. Marchelle Gearing      Initial Encounter Date:       Pulmonary Rehab Walk Test from 06/09/2016 in [facility] CARDIAC REHAB   Date  06/10/16   Referring Provider  Dr. Marchelle Gearing      Visit Diagnosis: No diagnosis found.  [name] Home Medications on Admission:   Current outpatient prescriptions:  .  aspirin 325 MG tablet, Take 2 tablets as needed for pain., Disp: , Rfl:  .  benzonatate (TESSALON) 200 MG capsule, Take 1 capsule (200 mg total) by mouth 3 (three) times daily as needed for cough., Disp: 20 capsule, Rfl: 0 .  predniSONE (DELTASONE) 20 MG tablet, Take 10 mg by mouth daily with breakfast. , Disp: , Rfl:   Current facility-administered medications:  .  albuterol (PROVENTIL) (2.5 MG/3ML) 0.083% nebulizer solution 2.5 mg, 2.5 mg, Nebulization, Once, Pearline Cables, MD  Past Medical History: Past Medical History  Diagnosis Date  . Raynaud's syndrome   . Hypercholesteremia   . CAP (community acquired pneumonia)   . Anemia     Chronic disease  . Chronic diastolic heart failure (HCC)   . Hypoxia   . ILD (interstitial lung disease) (HCC)   . Malnutrition of moderate degree (HCC)   . Pulmonary HTN (HCC)   . SIRS (systemic inflammatory response syndrome) (HCC)   . Acute respiratory failure with hypoxia (HCC)   . Leukocytosis   . Palpitations   . Protein calorie malnutrition (HCC)     Tobacco Use: History  Smoking status  . Never Smoker   Smokeless tobacco  . Never Used    Labs: Recent Review Flowsheet Data    There is no flowsheet data to display.      Capillary Blood Glucose: No results found for: GLUCAP   ADL UCSD:     Pulmonary Assessment Scores      06/10/16 0833 06/23/16 1344      ADL UCSD   ADL Phase  Entry    SOB Score total  75    mMRC Score   mMRC Score 3        Pulmonary Function Assessment:     Pulmonary Function Assessment - 06/03/16 1620    Breath   Bilateral Breath Sounds Other  fine crackles on the lower bases, otherwise clear   Shortness of Breath Yes;Fear of Shortness of Breath;Limiting activity      Exercise Target Goals:    Exercise Program Goal: Individual exercise prescription set with THRR, safety & activity barriers. Participant demonstrates ability to understand and report RPE using BORG scale, to self-measure pulse accurately, and to acknowledge the importance of the exercise prescription.  Exercise Prescription Goal: Starting with aerobic activity 30 plus minutes a day, 3 days per week for initial exercise prescription. Provide home exercise prescription and guidelines that participant acknowledges understanding prior to discharge.  Activity Barriers & Risk Stratification:     Activity Barriers & Cardiac Risk Stratification - 06/03/16 1619    Activity Barriers & Cardiac Risk Stratification   Activity Barriers Deconditioning;Shortness of Breath;Assistive Device      6 Minute Walk:     6 Minute Walk      06/10/16 0826       6 Minute  Walk   Phase Initial     Distance 800 feet     Walk Time 6 minutes     # of Rest Breaks 0     MPH 1.5     METS 2.15     RPE 12     Perceived Dyspnea  1     Symptoms No     Resting HR 101 bpm     Resting BP 110/64 mmHg     Max Ex. HR 117 bpm     Max Ex. BP 98/60 mmHg     Interval HR   Baseline HR 101     1 Minute HR 109     2 Minute HR 115     3 Minute HR 117     4 Minute HR 109     5 Minute HR 108     6 Minute HR 107     2 Minute Post HR 107     Interval Heart Rate? Yes     Interval Oxygen   Baseline Oxygen Saturation % 98 %     Baseline Liters of Oxygen 1 L     1 Minute Oxygen Saturation % 98 %     1 Minute Liters of Oxygen 1 L     2 Minute Oxygen Saturation % 93 %      2 Minute Liters of Oxygen 1 L     3 Minute Oxygen Saturation % 91 %     3 Minute Liters of Oxygen 1 L     4 Minute Oxygen Saturation % 93 %     4 Minute Liters of Oxygen 1 L     5 Minute Oxygen Saturation % 93 %     5 Minute Liters of Oxygen 1 L     6 Minute Oxygen Saturation % 94 %     6 Minute Liters of Oxygen 1 L     2 Minute Post Oxygen Saturation % 99 %     2 Minute Post Liters of Oxygen 1 L        Initial Exercise Prescription:     Initial Exercise Prescription - 06/10/16 0800    Date of Initial Exercise RX and Referring Provider   Date 06/10/16   Referring Provider Dr. Marchelle Gearing   Oxygen   Oxygen Continuous   Liters 1   Prescription Details   Frequency (times per week) 2   Duration Progress to 45 minutes of aerobic exercise without signs/symptoms of physical distress   Intensity   THRR 40-80% of Max Heartrate 62-124   Ratings of Perceived Exertion 11-13   Perceived Dyspnea 0-4   Progression   Progression Continue progressive overload as per policy without signs/symptoms or physical distress.   Resistance Training   Training Prescription Yes   Weight Orange bands   Reps 10-12      Perform Capillary Blood Glucose checks as needed.  Exercise Prescription Changes:     Exercise Prescription Changes      06/16/16 1700 06/23/16 1600 06/28/16 1600       Response to Exercise   Blood Pressure (Admit) 120/60 mmHg 120/70 mmHg 98/54 mmHg     Blood Pressure (Exercise) 112/70 mmHg 140/76 mmHg 126/70 mmHg     Blood Pressure (Exit) 110/60 mmHg 110/70 mmHg 100/56 mmHg     Heart Rate (Admit) 97 bpm 87 bpm 90 bpm     Heart Rate (Exercise) 96 bpm 89 bpm 93 bpm     Heart Rate (  Exit) 89 bpm 93 bpm 80 bpm     Oxygen Saturation (Admit) 98 % 98 % 97 %     Oxygen Saturation (Exercise) 96 % 94 % 94 %     Oxygen Saturation (Exit) 96 % 91 % 98 %     Rating of Perceived Exertion (Exercise) 14 15 15      Perceived Dyspnea (Exercise) 2 2 1      Duration Progress to 45 minutes of  aerobic exercise without signs/symptoms of physical distress Progress to 45 minutes of aerobic exercise without signs/symptoms of physical distress Progress to 45 minutes of aerobic exercise without signs/symptoms of physical distress     Intensity THRR New THRR New THRR New     Resistance Training   Training Prescription Yes Yes Yes     Weight Orange bands Orange bands Orange bands     Reps 10-12 10-12 10-12     Interval Training   Interval Training No No No     Oxygen   Oxygen Continuous Continuous Continuous     Liters 1 1 1      NuStep   Level   1     Minutes   17     METs   1.7     Arm Ergometer   Level 1 1 1      Minutes 17 17 17      Track   Laps 4 7 8      Minutes 17 17 17         Exercise Comments:     Exercise Comments      06/30/16 0834           Exercise Comments Has attended 3 exercise sessions, too early to see progress, tolerating exercise well.          Discharge Exercise Prescription (Final Exercise Prescription Changes):     Exercise Prescription Changes - 06/28/16 1600    Response to Exercise   Blood Pressure (Admit) 98/54 mmHg   Blood Pressure (Exercise) 126/70 mmHg   Blood Pressure (Exit) 100/56 mmHg   Heart Rate (Admit) 90 bpm   Heart Rate (Exercise) 93 bpm   Heart Rate (Exit) 80 bpm   Oxygen Saturation (Admit) 97 %   Oxygen Saturation (Exercise) 94 %   Oxygen Saturation (Exit) 98 %   Rating of Perceived Exertion (Exercise) 15   Perceived Dyspnea (Exercise) 1   Duration Progress to 45 minutes of aerobic exercise without signs/symptoms of physical distress   Intensity THRR New   Resistance Training   Training Prescription Yes   Weight Orange bands   Reps 10-12   Interval Training   Interval Training No   Oxygen   Oxygen Continuous   Liters 1   NuStep   Level 1   Minutes 17   METs 1.7   Arm Ergometer   Level 1   Minutes 17   Track   Laps 8   Minutes 17       Nutrition:  Target Goals: Understanding of nutrition guidelines,  daily intake of sodium 1500mg , cholesterol 200mg , calories 30% from fat and 7% or less from saturated fats, daily to have 5 or more servings of fruits and vegetables.  Biometrics:     Pre Biometrics - 06/10/16 1610    Pre Biometrics   Grip Strength 22 kg       Nutrition Therapy Plan and Nutrition Goals:   Nutrition Discharge: Rate Your Plate Scores:   Psychosocial: Target Goals: Acknowledge presence or absence of depression,  maximize coping skills, provide positive support system. Participant is able to verbalize types and ability to use techniques and skills needed for reducing stress and depression.  Initial Review & Psychosocial Screening:     Initial Psych Review & Screening - 06/03/16 1623    Family Dynamics   Good Support System? Yes   Screening Interventions   Interventions Encouraged to exercise      Quality of Life Scores:     Quality of Life - 06/23/16 1344    Quality of Life Scores   Health/Function Pre 7.39 %   Socioeconomic Pre 11.56 %   Psych/Spiritual Pre 6.14 %   Family Pre 26 %   GLOBAL Pre 9.91 %      PHQ-9:     Recent Review Flowsheet Data    [facility]2016 03/13/2016   Decreased Interest 1 0   Down, Depressed, Hopeless 1 0   PHQ - 2 Score 2 0   Altered sleeping 0 -   Tired, decreased energy 1 -   Change in appetite 0 -   Feeling bad or failure about yourself  1 -   Trouble concentrating 0 -   Moving slowly or fidgety/restless 0 -   Suicidal thoughts 0 -   PHQ-9 Score 4 -   Difficult doing work/chores Not difficult at all -      Psychosocial Evaluation and Intervention:   Psychosocial Re-Evaluation:     Psychosocial Re-Evaluation      06/27/16 1141           Psychosocial Re-Evaluation   Interventions Encouraged to attend Pulmonary Rehabilitation for the exercise       Comments No psychosocial concerns identified at this time.       Continued Psychosocial Services Needed No          Education: Education Goals: Education classes will be provided on a weekly basis, covering required topics. Participant will state understanding/return demonstration of topics presented.  Learning Barriers/Preferences:     Learning Barriers/Preferences - 06/03/16 1620    Learning Barriers/Preferences   Learning Barriers None   Learning Preferences Group Instruction;Individual Instruction;Written Material      Education Topics: Risk Factor Reduction:  -Group instruction that is supported by a PowerPoint presentation. Instructor discusses the definition of a risk factor, different risk factors for pulmonary disease, and how the heart and lungs work together.     Nutrition for Pulmonary Patient:  -Group instruction provided by PowerPoint slides, verbal discussion, and written materials to support subject matter. The instructor gives an explanation and review of healthy diet recommendations, which includes a discussion on weight management, recommendations for fruit and vegetable consumption, as well as protein, fluid, caffeine, fiber, sodium, sugar, and alcohol. Tips for eating when patients are short of breath are discussed.   Pursed Lip Breathing:  -Group instruction that is supported by demonstration and informational handouts. Instructor discusses the benefits of pursed lip and diaphragmatic breathing and detailed demonstration on how to preform both.     Oxygen Safety:  -Group instruction provided by PowerPoint, verbal discussion, and written material to support subject matter. There is an overview of "What is Oxygen" and "Why do we need it".  Instructor also reviews how to create a safe environment for oxygen use, the importance of using oxygen as prescribed, and the risks of noncompliance. There is a brief discussion on traveling with oxygen and resources the patient may utilize.   Oxygen Equipment:  -Group instruction provided by Home Health  Staff utilizing handouts, written  materials, and Chief Technology Officer.          PULMONARY REHAB OTHER RESPIRATORY from 06/23/2016 in [facility] CARDIAC REHAB   Date  06/16/16   Educator  Patsy Lager rep   Instruction Review Code  2- meets goals/outcomes      Signs and Symptoms:  -Group instruction provided by written material and verbal discussion to support subject matter. Warning signs and symptoms of infection, stroke, and heart attack are reviewed and when to call the physician/911 reinforced. Tips for preventing the spread of infection discussed.   Advanced Directives:  -Group instruction provided by verbal instruction and written material to support subject matter. Instructor reviews Advanced Directive laws and proper instruction for filling out document.   Pulmonary Video:  -Group video education that reviews the importance of medication and oxygen compliance, exercise, good nutrition, pulmonary hygiene, and pursed lip and diaphragmatic breathing for the pulmonary patient.   Exercise for the Pulmonary Patient:  -Group instruction that is supported by a PowerPoint presentation. Instructor discusses benefits of exercise, core components of exercise, frequency, duration, and intensity of an exercise routine, importance of utilizing pulse oximetry during exercise, safety while exercising, and options of places to exercise outside of rehab.        PULMONARY REHAB OTHER RESPIRATORY from 06/23/2016 in [facility] CARDIAC REHAB   Date  06/23/16   Educator  EP   Instruction Review Code  2- meets goals/outcomes      Pulmonary Medications:  -Verbally interactive group education provided by instructor with focus on inhaled medications and proper administration.   Anatomy and Physiology of the Respiratory System and Intimacy:  -Group instruction provided by PowerPoint, verbal discussion, and written material to support subject matter. Instructor reviews respiratory cycle and anatomical  components of the respiratory system and their functions. Instructor also reviews differences in obstructive and restrictive respiratory diseases with examples of each. Intimacy, Sex, and Sexuality differences are reviewed with a discussion on how relationships can change when diagnosed with pulmonary disease. Common sexual concerns are reviewed.   Knowledge Questionnaire Score:     Knowledge Questionnaire Score - 06/23/16 1343    Knowledge Questionnaire Score   Pre Score 12/13      Core Components/Risk Factors/Patient Goals at Admission:     Personal Goals and Risk Factors at Admission - 06/03/16 1621    Core Components/Risk Factors/Patient Goals on Admission   Increase Strength and Stamina Yes   Intervention Provide advice, education, support and counseling about physical activity/exercise needs.;Develop an individualized exercise prescription for aerobic and resistive training based on initial evaluation findings, risk stratification, comorbidities and participant's personal goals.   Expected Outcomes Achievement of increased cardiorespiratory fitness and enhanced flexibility, muscular endurance and strength shown through measurements of functional capacity and personal statement of participant.   Improve shortness of breath with ADL's Yes   Intervention Provide education, individualized exercise plan and daily activity instruction to help decrease symptoms of SOB with activities of daily living.   Expected Outcomes Short Term: Achieves a reduction of symptoms when performing activities of daily living.   Develop more efficient breathing techniques such as purse lipped breathing and diaphragmatic breathing; and practicing self-pacing with activity Yes   Intervention Provide education, demonstration and support about specific breathing techniuqes utilized for more efficient breathing. Include techniques such as pursed lipped breathing, diaphragmatic breathing and self-pacing activity.    Expected Outcomes Short Term: Participant will be able to demonstrate and use breathing  techniques as needed throughout daily activities.   Increase knowledge of respiratory medications and ability to use respiratory devices properly  Yes   Intervention Provide education and demonstration as needed of appropriate use of medications, inhalers, and oxygen therapy.   Expected Outcomes Short Term: Achieves understanding of medications use. Understands that oxygen is a medication prescribed by physician. Demonstrates appropriate use of inhaler and oxygen therapy.      Core Components/Risk Factors/Patient Goals Review:      Goals and Risk Factor Review      06/27/16 1136           Core Components/Risk Factors/Patient Goals Review   Personal Goals Review Increase Strength and Stamina;Improve shortness of breath with ADL's;Develop more efficient breathing techniques such as purse lipped breathing and diaphragmatic breathing and practicing self-pacing with activity.       Review Has only attended less than 5 exercise sessions, it is too early to see improvement in strength and stamina       Expected Outcomes She should should see a noticeable improvement in the next 30 days.          Core Components/Risk Factors/Patient Goals at Discharge (Final Review):      Goals and Risk Factor Review - 06/27/16 1136    Core Components/Risk Factors/Patient Goals Review   Personal Goals Review Increase Strength and Stamina;Improve shortness of breath with ADL's;Develop more efficient breathing techniques such as purse lipped breathing and diaphragmatic breathing and practicing self-pacing with activity.   Review Has only attended less than 5 exercise sessions, it is too early to see improvement in strength and stamina   Expected Outcomes She should should see a noticeable improvement in the next 30 days.      ITP Comments:   Comments: ITP REVIEW Pt is making expected progress toward personal goals  after completing 3 sessions.   Recommend continued exercise, life style modification, education, and utilization of breathing techniques to increase stamina and strength and decrease shortness of breath with exertion.

## 2016-07-05 ENCOUNTER — Encounter (HOSPITAL_COMMUNITY)
Admission: RE | Admit: 2016-07-05 | Discharge: 2016-07-05 | Disposition: A | Discharge status: Home / Self Care | Payer: Medicare Other | Source: Ambulatory Visit | Attending: Internal Medicine | Admitting: Internal Medicine

## 2016-07-05 VITALS — Wt 127.6 lb

## 2016-07-05 DIAGNOSIS — Z7982 Long term (current) use of aspirin: Secondary | ICD-10-CM | POA: Diagnosis not present

## 2016-07-05 DIAGNOSIS — E7800 Pure hypercholesterolemia, unspecified: Secondary | ICD-10-CM | POA: Diagnosis not present

## 2016-07-05 DIAGNOSIS — I7300 Raynaud's syndrome without gangrene: Secondary | ICD-10-CM | POA: Diagnosis not present

## 2016-07-05 DIAGNOSIS — Z79899 Other long term (current) drug therapy: Secondary | ICD-10-CM | POA: Diagnosis not present

## 2016-07-05 DIAGNOSIS — J849 Interstitial pulmonary disease, unspecified: Secondary | ICD-10-CM | POA: Diagnosis not present

## 2016-07-05 DIAGNOSIS — I5032 Chronic diastolic (congestive) heart failure: Secondary | ICD-10-CM | POA: Diagnosis not present

## 2016-07-05 NOTE — Progress Notes (Signed)
Daily Session Note  Patient Details  Name: Julie Page MRN: [id number] Date of Birth: 11-Mar-1951 Referring Provider:        Pulmonary Rehab Walk Test from 06/09/2016 in [place]   Referring Provider  Dr. Chase Caller      Encounter Date: 07/05/2016  Check In:   Capillary Blood Glucose: No results found for this or any previous visit (from the past 24 hour(s)).      Exercise Prescription Changes - 07/05/16 1500    Response to Exercise   Blood Pressure (Admit) 96/52 mmHg   Blood Pressure (Exercise) 120/66 mmHg   Blood Pressure (Exit) 102/62 mmHg   Heart Rate (Admit) 81 bpm   Heart Rate (Exercise) 88 bpm   Heart Rate (Exit) 78 bpm   Oxygen Saturation (Admit) 98 %   Oxygen Saturation (Exercise) 93 %   Oxygen Saturation (Exit) 100 %   Rating of Perceived Exertion (Exercise) 13   Perceived Dyspnea (Exercise) 2   Duration Progress to 45 minutes of aerobic exercise without signs/symptoms of physical distress   Intensity THRR unchanged   Progression   Progression Continue to progress workloads to maintain intensity without signs/symptoms of physical distress.   Resistance Training   Training Prescription Yes   Weight orange bands   Reps 10-12  10 minutes of strength training   Interval Training   Interval Training No   Oxygen   Oxygen Continuous   Liters 1   NuStep   Level 2   Minutes 17   METs 1.7   Arm Ergometer   Level 1   Minutes 17   Track   Laps 9   Minutes 17     Goals Met:  Exercise tolerated well No report of cardiac concerns or symptoms Strength training completed today  Goals Unmet:  Not Applicable  Comments: Service time is from 1330 to 1500    Dr. Rush Farmer is Medical Director for Pulmonary Rehab at [facility]

## 2016-07-07 ENCOUNTER — Encounter (HOSPITAL_COMMUNITY)
Admission: RE | Admit: 2016-07-07 | Discharge: 2016-07-07 | Disposition: A | Discharge status: Home / Self Care | Payer: Medicare Other | Source: Ambulatory Visit | Attending: Internal Medicine | Admitting: Internal Medicine

## 2016-07-07 VITALS — Wt 127.6 lb

## 2016-07-07 DIAGNOSIS — J849 Interstitial pulmonary disease, unspecified: Secondary | ICD-10-CM | POA: Diagnosis not present

## 2016-07-07 DIAGNOSIS — I5032 Chronic diastolic (congestive) heart failure: Secondary | ICD-10-CM | POA: Diagnosis not present

## 2016-07-07 DIAGNOSIS — I7300 Raynaud's syndrome without gangrene: Secondary | ICD-10-CM | POA: Diagnosis not present

## 2016-07-07 DIAGNOSIS — Z79899 Other long term (current) drug therapy: Secondary | ICD-10-CM | POA: Diagnosis not present

## 2016-07-07 DIAGNOSIS — E7800 Pure hypercholesterolemia, unspecified: Secondary | ICD-10-CM | POA: Diagnosis not present

## 2016-07-07 DIAGNOSIS — Z7982 Long term (current) use of aspirin: Secondary | ICD-10-CM | POA: Diagnosis not present

## 2016-07-07 NOTE — Progress Notes (Signed)
Daily Session Note  Patient Details  Name: Julie Page MRN: [id number] Date of Birth: [birth year] Referring Provider:        Pulmonary Rehab Walk Test from 06/09/2016 in [place]   Referring Provider  Dr. Chase Caller      Encounter Date: 07/07/2016  Check In:     Session Check In - 07/07/16 1330    Check-In   Location [facility]   Staff Present Rosebud Poles, RN, BSN;Lisa Ysidro Evert, RN;Portia Rollene Rotunda, RN, BSN;Ramon Dredge, RN, MHA;[name] diVincenzo, MS, ACSM RCEP, Exercise Physiologist   Supervising physician immediately available to respond to emergencies [facility] immediately available   Physician(s) Dr. Cruzita Lederer   Medication changes reported     No   Fall or balance concerns reported    No   Warm-up and Cool-down Performed as group-led instruction   Resistance Training Performed Yes   VAD Patient? No   Pain Assessment   Currently in Pain? No/denies   Multiple Pain Sites No      Capillary Blood Glucose: No results found for this or any previous visit (from the past 24 hour(s)).      Exercise Prescription Changes - 07/07/16 1500    Exercise Review   Progression Yes   Response to Exercise   Blood Pressure (Admit) 104/50 mmHg   Blood Pressure (Exercise) 110/60 mmHg   Blood Pressure (Exit) 100/60 mmHg   Heart Rate (Admit) 88 bpm   Heart Rate (Exercise) 81 bpm   Heart Rate (Exit) 76 bpm   Oxygen Saturation (Admit) 95 %   Oxygen Saturation (Exercise) 94 %   Oxygen Saturation (Exit) 99 %   Rating of Perceived Exertion (Exercise) 14   Perceived Dyspnea (Exercise) 2   Duration Progress to 45 minutes of aerobic exercise without signs/symptoms of physical distress   Intensity THRR unchanged   Progression   Progression Continue to progress workloads to maintain intensity without signs/symptoms of physical distress.   Resistance Training   Training Prescription Yes   Weight orange bands   Reps 10-12  10  minutes of strength training   Interval Training   Interval Training No   Oxygen   Oxygen Continuous   Liters 1   NuStep   Level 3   Minutes 17   METs 1.7   Track   Laps 8   Minutes 17     Goals Met:  Improved SOB with ADL's Exercise tolerated well Strength training completed today  Goals Unmet:  Not Applicable  Comments: Service time is from 1330 to 1540    Dr. Rush Farmer is Medical Director for Pulmonary Rehab at [facility].

## 2016-07-12 ENCOUNTER — Encounter (HOSPITAL_COMMUNITY)
Admission: RE | Admit: 2016-07-12 | Discharge: 2016-07-12 | Disposition: A | Discharge status: Home / Self Care | Payer: Medicare Other | Source: Ambulatory Visit | Attending: Internal Medicine | Admitting: Internal Medicine

## 2016-07-12 VITALS — Wt 127.4 lb

## 2016-07-12 DIAGNOSIS — Z7982 Long term (current) use of aspirin: Secondary | ICD-10-CM | POA: Diagnosis not present

## 2016-07-12 DIAGNOSIS — I5032 Chronic diastolic (congestive) heart failure: Secondary | ICD-10-CM | POA: Diagnosis not present

## 2016-07-12 DIAGNOSIS — Z79899 Other long term (current) drug therapy: Secondary | ICD-10-CM | POA: Diagnosis not present

## 2016-07-12 DIAGNOSIS — I7300 Raynaud's syndrome without gangrene: Secondary | ICD-10-CM | POA: Diagnosis not present

## 2016-07-12 DIAGNOSIS — E7800 Pure hypercholesterolemia, unspecified: Secondary | ICD-10-CM | POA: Diagnosis not present

## 2016-07-12 DIAGNOSIS — J849 Interstitial pulmonary disease, unspecified: Secondary | ICD-10-CM

## 2016-07-12 NOTE — Progress Notes (Signed)
Daily Session Note  Patient Details  Name: Julie Page MRN: [id number] Date of Birth: [birth year] Referring Provider:   [name] Pulmonary Rehab Walk Test from 06/09/2016 in [place]  Referring Provider  Dr. Chase Caller      Encounter Date: 07/12/2016  Check In:     Session Check In - 07/12/16 1537      Check-In   Location [facility]   Staff Present Rosebud Poles, RN, BSN;Lisa Ysidro Evert, RN;Tanmay Halteman Rollene Rotunda, RN, BSN   Supervising physician immediately available to respond to emergencies [facility] immediately available   Physician(s) Dr. Marily Memos   Medication changes reported     No   Fall or balance concerns reported    No   Warm-up and Cool-down Performed as group-led instruction   Resistance Training Performed Yes   VAD Patient? No     Pain Assessment   Currently in Pain? No/denies   Multiple Pain Sites No      Capillary Blood Glucose: No results found for this or any previous visit (from the past 24 hour(s)).      Exercise Prescription Changes - 07/12/16 1543      Response to Exercise   Blood Pressure (Admit) 110/56   Blood Pressure (Exercise) 126/60   Blood Pressure (Exit) 102/60   Heart Rate (Admit) 96 bpm   Heart Rate (Exercise) 91 bpm   Heart Rate (Exit) 79 bpm   Oxygen Saturation (Admit) 97 %   Oxygen Saturation (Exercise) 96 %   Oxygen Saturation (Exit) 98 %   Rating of Perceived Exertion (Exercise) 15   Perceived Dyspnea (Exercise) 2   Duration Progress to 45 minutes of aerobic exercise without signs/symptoms of physical distress   Intensity THRR unchanged     Progression   Progression Continue to progress workloads to maintain intensity without signs/symptoms of physical distress.     Resistance Training   Training Prescription Yes   Weight orange bands   Reps 10-12  10 minutes of strength training     Interval Training   Interval Training No     Oxygen   Oxygen Continuous   Liters  1     NuStep   Level 3   Minutes 17   METs 1.9     Arm Ergometer   Level 1   Minutes 17     Track   Laps 8   Minutes 17      Goals Met:  Exercise tolerated well Queuing for purse lip breathing No report of cardiac concerns or symptoms Strength training completed today  Goals Unmet:  Not Applicable  Comments: Service time is from 1330 to 1515   Dr. Rush Farmer is Medical Director for Pulmonary Rehab at [facility].

## 2016-07-13 ENCOUNTER — Encounter (HOSPITAL_COMMUNITY): Payer: Self-pay

## 2016-07-13 ENCOUNTER — Ambulatory Visit (HOSPITAL_COMMUNITY)
Admission: RE | Admit: 2016-07-13 | Discharge: 2016-07-13 | Disposition: A | Discharge status: Home / Self Care | Payer: Medicare Other | Source: Ambulatory Visit | Attending: Internal Medicine | Admitting: Internal Medicine

## 2016-07-13 ENCOUNTER — Encounter (HOSPITAL_COMMUNITY): Payer: Self-pay | Admitting: *Deleted

## 2016-07-13 VITALS — BP 120/68 | HR 76 | Wt 125.5 lb

## 2016-07-13 DIAGNOSIS — I7300 Raynaud's syndrome without gangrene: Secondary | ICD-10-CM | POA: Insufficient documentation

## 2016-07-13 DIAGNOSIS — J84112 Idiopathic pulmonary fibrosis: Secondary | ICD-10-CM | POA: Insufficient documentation

## 2016-07-13 DIAGNOSIS — Z8249 Family history of ischemic heart disease and other diseases of the circulatory system: Secondary | ICD-10-CM | POA: Diagnosis not present

## 2016-07-13 DIAGNOSIS — J849 Interstitial pulmonary disease, unspecified: Secondary | ICD-10-CM

## 2016-07-13 DIAGNOSIS — R0902 Hypoxemia: Secondary | ICD-10-CM | POA: Insufficient documentation

## 2016-07-13 DIAGNOSIS — R0602 Shortness of breath: Secondary | ICD-10-CM | POA: Insufficient documentation

## 2016-07-13 DIAGNOSIS — J8489 Other specified interstitial pulmonary diseases: Secondary | ICD-10-CM | POA: Diagnosis not present

## 2016-07-13 DIAGNOSIS — I272 Other secondary pulmonary hypertension: Secondary | ICD-10-CM | POA: Diagnosis not present

## 2016-07-13 DIAGNOSIS — Z9981 Dependence on supplemental oxygen: Secondary | ICD-10-CM | POA: Diagnosis not present

## 2016-07-13 NOTE — Patient Instructions (Signed)
Right Heart Catheterization on Friday 07/22/16, see instruction sheet

## 2016-07-14 ENCOUNTER — Encounter (HOSPITAL_COMMUNITY)
Admission: RE | Admit: 2016-07-14 | Discharge: 2016-07-14 | Disposition: A | Discharge status: Home / Self Care | Payer: Medicare Other | Source: Ambulatory Visit | Attending: Internal Medicine | Admitting: Internal Medicine

## 2016-07-14 ENCOUNTER — Telehealth (HOSPITAL_COMMUNITY): Payer: Self-pay | Admitting: *Deleted

## 2016-07-14 VITALS — Wt 127.0 lb

## 2016-07-14 DIAGNOSIS — J849 Interstitial pulmonary disease, unspecified: Secondary | ICD-10-CM

## 2016-07-14 DIAGNOSIS — E7800 Pure hypercholesterolemia, unspecified: Secondary | ICD-10-CM | POA: Diagnosis not present

## 2016-07-14 DIAGNOSIS — Z7982 Long term (current) use of aspirin: Secondary | ICD-10-CM | POA: Diagnosis not present

## 2016-07-14 DIAGNOSIS — I5032 Chronic diastolic (congestive) heart failure: Secondary | ICD-10-CM | POA: Diagnosis not present

## 2016-07-14 DIAGNOSIS — Z79899 Other long term (current) drug therapy: Secondary | ICD-10-CM | POA: Diagnosis not present

## 2016-07-14 DIAGNOSIS — I7300 Raynaud's syndrome without gangrene: Secondary | ICD-10-CM | POA: Diagnosis not present

## 2016-07-14 NOTE — Progress Notes (Signed)
Patient ID: Julie Page, female   DOB: 1951/09/12, 65 y.o.   MRN: [id number] PCP: Dr. Arthor Captain Pulmonology: Dr. Marchelle Gearing Cardiology: Dr. 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  She was hospitalized for a week back in 4/17.  At that time, CT chest showed classic changes of UIP.  She was treated with steroids and oxygen.  She has been able to titrate off prednisone over time.  She has been considering anti-fibrotic therapy but is still undecided.    She uses oxygen all the time.  She is short of breath with moderate exertion.  No chest pain, no lightheadedness or syncope.  Echo was done in 6/17 and while it showed a normal-appearing RV, PA systolic pressure was 45 mmHg.   Labs (4/17): creatinine 0.48  PMH: 1. Raynauds phenomenon. 2. 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  4/17 CT with classic UIP pattern.  Restrictive PFTs.  - Trace SSA+, RF+, ANA trace+, [name]-, SCL70-, SSB-, DSDNA-, CCP-, hypersensitivity pneumonitis panel -  - On home oxygen.  - Echo (4/17) with EF 60-65%, grade I diastolic dysfunction, PASP 45 mmHg, RV normal size and systolic function.   SH: Lives with son, nonsmoker, no ETOH.   FH: Mother with MI at 29, uncle with MI at 44, grandfather with MI at 63  ROS: All systems reviewed and negative except as per HPI.    No current outpatient prescriptions on file.   No current facility-administered medications for this encounter.    BP 120/68   Pulse 76   Wt 125 lb 8 oz (56.9 kg)   SpO2 100% Comment: on 1L of O2  BMI 24.51 kg/m  General: NAD, frail.  Neck: No JVD, no thyromegaly or thyroid nodule.  Lungs: Slight crackles at bases bilaterally.  CV: Nondisplaced PMI.  Heart regular S1/S2, no S3/S4, no murmur.  No peripheral edema.  No carotid bruit.  Normal pedal pulses.  Abdomen: Soft, nontender, no hepatosplenomegaly, no distention.  Skin: Intact without lesions or rashes.   Neurologic: Alert and oriented x 3.  Psych: Normal affect. Extremities: No clubbing or cyanosis.  HEENT: Normal.   Assessment/Plan: 1. Pulmonary hypertension: PA systolic pressure 45 mmHg on echo with normal-appearing RV.  If PA pressure is indeed elevated, most likely either Group 3 PH or Group 1 PH or a mix in this patient.  Group 3 PH (due to lung damage from ILD) is unlikely to respond well to pulmonary vasodilators.  Group 1 disease may improve, and this patient is at risk for Group 1 disease (suspected rheumatologic involvement).  Cannot rule out Group 2 disease (pulmonary venous hypertension) but seems less likely to me.  If she has a component of Group 1 PH, pulmonary vasodilators may help.  - We discussed RHC to assess PA pressure.  I explained risks/benefits to patient and son, and she agrees to proceed with study.  - Continue oxygen at all times to limit hypoxia, which raises PA pressure.  2. 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  Per Dr Marchelle Gearing.   Marca Ancona 07/14/2016

## 2016-07-14 NOTE — Telephone Encounter (Signed)
Spoke with Aurea Graff w/ Pulmonary Rehab - she is requesting an order from MR for POC for pt.  She is exercising on 1L and maintaining well.  She is currently using a 1/2 cylinder tank.  MR please advise, thank you.

## 2016-07-14 NOTE — Progress Notes (Signed)
Daily Session Note  Patient Details  Name: Julie Page MRN: [id number] Date of Birth: 02/24/1951 Referring Provider:   [name] Pulmonary Rehab Walk Test from 06/09/2016 in [place]  Referring Provider  Dr. Chase Caller      Encounter Date: 07/14/2016  Check In:     Session Check In - 07/14/16 1330      Check-In   Location [facility]   Staff Present Rosebud Poles, RN, BSN;[name] diVincenzo, MS, ACSM RCEP, Exercise Physiologist;Annedrea Rosezella [place], RN, MHA;Mckinna Demars Rollene Rotunda, RN, BSN   Supervising physician immediately available to respond to emergencies [facility] immediately available   Physician(s) Dr. Waldron Labs   Medication changes reported     No   Fall or balance concerns reported    No   Warm-up and Cool-down Performed as group-led instruction   Resistance Training Performed Yes   VAD Patient? No     Pain Assessment   Currently in Pain? No/denies   Multiple Pain Sites No      Capillary Blood Glucose: No results found for this or any previous visit (from the past 24 hour(s)).      Exercise Prescription Changes - 07/14/16 1542      Response to Exercise   Blood Pressure (Admit) 95/64   Blood Pressure (Exercise) 98/60   Blood Pressure (Exit) 112/60   Heart Rate (Admit) 85 bpm   Heart Rate (Exercise) 80 bpm   Heart Rate (Exit) 75 bpm   Oxygen Saturation (Admit) 98 %   Oxygen Saturation (Exercise) 92 %   Oxygen Saturation (Exit) 99 %   Rating of Perceived Exertion (Exercise) 15   Perceived Dyspnea (Exercise) 2   Duration Progress to 45 minutes of aerobic exercise without signs/symptoms of physical distress   Intensity THRR unchanged     Progression   Progression Continue to progress workloads to maintain intensity without signs/symptoms of physical distress.     Resistance Training   Training Prescription Yes   Weight orange bands   Reps 10-12  10 minutes of strength training     Interval Training    Interval Training No     Oxygen   Oxygen Continuous   Liters 1     Arm Ergometer   Level 1   Minutes 17     Track   Laps 8   Minutes 17     Goals Met:  Using PLB without cueing & demonstrates good technique Exercise tolerated well No report of cardiac concerns or symptoms Strength training completed today  Goals Unmet:  Not Applicable  Comments: Service time is from 1330 to 1505   Dr. Rush Farmer is Medical Director for Pulmonary Rehab at [facility].

## 2016-07-15 ENCOUNTER — Telehealth (HOSPITAL_COMMUNITY): Payer: Self-pay | Admitting: *Deleted

## 2016-07-15 NOTE — Telephone Encounter (Signed)
Pt called and said that she had plans next Friday and would like to cancel her RHC. She said that she would follow up with Dr Marchelle Gearing, in September.

## 2016-07-15 NOTE — Telephone Encounter (Signed)
1. Request by Aurea Graff: I approve  2. Please call [name] and find out if she decided on esbriet  V ofev; she was not sure as of my June note. If she is on any one then you need to order LFT at the 4 week time point  THanks  Dr. Kalman Shan, M.D., [facility].C.P Pulmonary and Critical Care Medicine Staff Physician [facility] Pager: [phone], If no answer or between  15:00h - 7:00h: call [phone]  07/15/2016 1:49 PM     No current outpatient prescriptions on file.

## 2016-07-16 NOTE — Telephone Encounter (Signed)
Murali, let me know if she changes her mind.  I had set Julie Page up for RHC but she has now cancelled it.

## 2016-07-18 NOTE — Telephone Encounter (Signed)
Spoke with pt, states she does not wish to start either [name] or Esbriet at this time. Forwarding to MR to make aware.

## 2016-07-19 ENCOUNTER — Encounter (HOSPITAL_COMMUNITY)
Admission: RE | Admit: 2016-07-19 | Discharge: 2016-07-19 | Disposition: A | Discharge status: Home / Self Care | Payer: Medicare Other | Source: Ambulatory Visit | Attending: Internal Medicine | Admitting: Internal Medicine

## 2016-07-19 VITALS — Wt 126.1 lb

## 2016-07-19 DIAGNOSIS — E7800 Pure hypercholesterolemia, unspecified: Secondary | ICD-10-CM | POA: Diagnosis not present

## 2016-07-19 DIAGNOSIS — I7300 Raynaud's syndrome without gangrene: Secondary | ICD-10-CM | POA: Insufficient documentation

## 2016-07-19 DIAGNOSIS — J849 Interstitial pulmonary disease, unspecified: Secondary | ICD-10-CM

## 2016-07-19 DIAGNOSIS — Z7982 Long term (current) use of aspirin: Secondary | ICD-10-CM | POA: Insufficient documentation

## 2016-07-19 DIAGNOSIS — Z79899 Other long term (current) drug therapy: Secondary | ICD-10-CM | POA: Diagnosis not present

## 2016-07-19 DIAGNOSIS — I5032 Chronic diastolic (congestive) heart failure: Secondary | ICD-10-CM | POA: Diagnosis not present

## 2016-07-19 NOTE — Progress Notes (Signed)
Daily Session Note  Patient Details  Name: Julie Page MRN: [id number] Date of Birth: [birth year] Referring Provider:   [name] Pulmonary Rehab Walk Test from 06/09/2016 in [place]  Referring Provider  Dr. Chase Caller      Encounter Date: 07/19/2016  Check In:     Session Check In - 07/19/16 1340      Check-In   Location [facility]   Staff Present Su Hilt, MS, ACSM RCEP, Exercise Physiologist;Vincy Feliz Leonia Reeves, RN, BSN;Lisa Hughes, RN;Portia Rollene Rotunda, RN, BSN   Supervising physician immediately available to respond to emergencies [facility] immediately available   Physician(s) Dr. Marily Memos   Medication changes reported     No   Fall or balance concerns reported    No   Warm-up and Cool-down Performed as group-led instruction   Resistance Training Performed Yes   VAD Patient? No     Pain Assessment   Currently in Pain? No/denies   Multiple Pain Sites No      Capillary Blood Glucose: No results found for this or any previous visit (from the past 24 hour(s)).      Exercise Prescription Changes - 07/19/16 1600      Response to Exercise   Blood Pressure (Admit) 90/44   Blood Pressure (Exercise) 110/60   Blood Pressure (Exit) 112/64   Heart Rate (Admit) 81 bpm   Heart Rate (Exercise) 84 bpm   Heart Rate (Exit) 75 bpm   Oxygen Saturation (Admit) 98 %   Oxygen Saturation (Exercise) 96 %   Oxygen Saturation (Exit) 97 %   Rating of Perceived Exertion (Exercise) 17   Perceived Dyspnea (Exercise) 2   Duration Progress to 45 minutes of aerobic exercise without signs/symptoms of physical distress   Intensity THRR unchanged     Progression   Progression Continue to progress workloads to maintain intensity without signs/symptoms of physical distress.     Resistance Training   Training Prescription Yes   Weight orange bands   Reps 10-12  10 minutes of strength training     Interval Training   Interval  Training No     Oxygen   Oxygen Continuous   Liters 1     NuStep   Level 3   Minutes 17   METs 1.9     Arm Ergometer   Level 1   Minutes 17     Track   Laps 7   Minutes 17     Goals Met:  Using PLB without cueing & demonstrates good technique Exercise tolerated well Strength training completed today  Goals Unmet:  Not Applicable  Comments: Service time is from 1330 to 1500    Dr. Rush Farmer is Medical Director for Pulmonary Rehab at [facility].

## 2016-07-19 NOTE — Progress Notes (Signed)
Julie Page 65 y.o. female Nutrition Note Spoke with pt and pt's son. Pt is overweight; recently dx with moderate degree of malnutrition. Pt wt is down 7-12 lb from UBW range of 133-138 lb. Pt denies having a poor appetite. Pt's son helps with grocery shopping and cooking. There are some ways the pt can make her eating habits healthier. Pt's Rate Your Plate results reviewed with pt. Pt does not avoid salty food; uses some canned/ convenience food.  Pt does not add salt to food.  The role of sodium in CHF and lung disease reviewed with pt. Pt expressed understanding of the information reviewed via feedback method.  No results found for: HGBA1C  Nutrition Diagnosis ? Food-and nutrition-related knowledge deficit related to lack of exposure to information as related to diagnosis of pulmonary disease Nutrition Intervention ? Pt's individual nutrition plan and goals reviewed with pt. ? Benefits of adopting healthy eating habits discussed when pt's Rate Your Plate reviewed. ? Pt to attend the Nutrition and Lung Disease class ? Continual client-centered nutrition education by RD, as part of interdisciplinary care. Goal(s) 1. The pt will recognize symptoms that can interfere with adequate oral intake, such as shortness of breath, N/V, early satiety, fatigue, ability to secure and prepare food, taste and smell changes, chewing/swallowing difficulties, and/ or pain when eating. Monitor and Evaluate progress toward nutrition goal with team.   Mickle Plumb, M.Ed, RD, LDN, CDE 07/19/2016 3:30 PM

## 2016-07-21 ENCOUNTER — Encounter (HOSPITAL_COMMUNITY)
Admission: RE | Admit: 2016-07-21 | Discharge: 2016-07-21 | Disposition: A | Discharge status: Home / Self Care | Payer: Medicare Other | Source: Ambulatory Visit | Attending: Internal Medicine | Admitting: Internal Medicine

## 2016-07-21 VITALS — Wt 125.4 lb

## 2016-07-21 DIAGNOSIS — I7300 Raynaud's syndrome without gangrene: Secondary | ICD-10-CM | POA: Diagnosis not present

## 2016-07-21 DIAGNOSIS — J849 Interstitial pulmonary disease, unspecified: Secondary | ICD-10-CM

## 2016-07-21 DIAGNOSIS — I5032 Chronic diastolic (congestive) heart failure: Secondary | ICD-10-CM | POA: Diagnosis not present

## 2016-07-21 DIAGNOSIS — E7800 Pure hypercholesterolemia, unspecified: Secondary | ICD-10-CM | POA: Diagnosis not present

## 2016-07-21 DIAGNOSIS — Z7982 Long term (current) use of aspirin: Secondary | ICD-10-CM | POA: Diagnosis not present

## 2016-07-21 DIAGNOSIS — Z79899 Other long term (current) drug therapy: Secondary | ICD-10-CM | POA: Diagnosis not present

## 2016-07-21 NOTE — Progress Notes (Signed)
Daily Session Note  Patient Details  Name: Julie Page MRN: [id number] Date of Birth: 27-Nov-1951 Referring Provider:   [name] Pulmonary Rehab Walk Test from 06/09/2016 in [place]  Referring Provider  Dr. Chase Caller      Encounter Date: 07/21/2016  Check In:     Session Check In - 07/21/16 1350      Check-In   Location [facility]   Staff Present Rosebud Poles, RN, BSN;[name] diVincenzo, MS, ACSM RCEP, Exercise Physiologist;Lisa Ysidro Evert, RN;Keon Pender Rollene Rotunda, RN, BSN   Supervising physician immediately available to respond to emergencies [facility] immediately available   Physician(s) Dr. Marily Memos   Medication changes reported     No   Fall or balance concerns reported    No   Warm-up and Cool-down Performed as group-led instruction   Resistance Training Performed Yes   VAD Patient? No     Pain Assessment   Currently in Pain? No/denies   Multiple Pain Sites No      Capillary Blood Glucose: No results found for this or any previous visit (from the past 24 hour(s)).      Exercise Prescription Changes - 07/21/16 1546      Response to Exercise   Blood Pressure (Admit) 104/80   Blood Pressure (Exercise) 118/62   Blood Pressure (Exit) 108/56   Heart Rate (Admit) 87 bpm   Heart Rate (Exercise) 94 bpm   Heart Rate (Exit) 85 bpm   Oxygen Saturation (Admit) 98 %   Oxygen Saturation (Exercise) 95 %   Oxygen Saturation (Exit) 98 %   Rating of Perceived Exertion (Exercise) 15   Perceived Dyspnea (Exercise) 2   Duration Progress to 45 minutes of aerobic exercise without signs/symptoms of physical distress   Intensity THRR unchanged     Progression   Progression Continue to progress workloads to maintain intensity without signs/symptoms of physical distress.     Resistance Training   Training Prescription Yes   Weight orange bands   Reps 10-12  10 minutes of strength training     Interval Training   Interval  Training No     Oxygen   Oxygen Continuous   Liters 1     NuStep   Level 3   Minutes 17   METs 1.9     Arm Ergometer   Level 1   Minutes 17     Goals Met:  Exercise tolerated well Queuing for purse lip breathing No report of cardiac concerns or symptoms Strength training completed today  Goals Unmet:  RPE  Comments: Service time is from 1330 to 1510. Patient also attended an education session on Mindfulness and Meditation.   Dr. Rush Farmer is Medical Director for Pulmonary Rehab at [facility].

## 2016-07-22 ENCOUNTER — Ambulatory Visit (HOSPITAL_COMMUNITY): Admission: RE | Admit: 2016-07-22 | Payer: Medicare Other | Source: Ambulatory Visit | Admitting: Cardiology

## 2016-07-22 ENCOUNTER — Encounter (HOSPITAL_COMMUNITY): Admission: RE | Payer: Self-pay | Source: Ambulatory Visit

## 2016-07-22 SURGERY — RIGHT HEART CATH

## 2016-07-22 NOTE — Telephone Encounter (Addendum)
Ok no problem. Thanks Dalton No need to reply back  Dr. Kalman Shan, M.D., [facility]C.P Pulmonary and Critical Care Medicine Staff Physician [place] [facility] [facility] [place] Pulmonary and Critical Care Pager: [phone], If no answer or between  15:00h - 7:00h: call [phone]  07/22/2016 7:56 AM

## 2016-07-26 ENCOUNTER — Encounter (HOSPITAL_COMMUNITY)
Admission: RE | Admit: 2016-07-26 | Discharge: 2016-07-26 | Disposition: A | Discharge status: Home / Self Care | Payer: Medicare Other | Source: Ambulatory Visit | Attending: Internal Medicine | Admitting: Internal Medicine

## 2016-07-26 VITALS — Wt 124.8 lb

## 2016-07-26 DIAGNOSIS — I7300 Raynaud's syndrome without gangrene: Secondary | ICD-10-CM | POA: Diagnosis not present

## 2016-07-26 DIAGNOSIS — Z7982 Long term (current) use of aspirin: Secondary | ICD-10-CM | POA: Diagnosis not present

## 2016-07-26 DIAGNOSIS — Z79899 Other long term (current) drug therapy: Secondary | ICD-10-CM | POA: Diagnosis not present

## 2016-07-26 DIAGNOSIS — E7800 Pure hypercholesterolemia, unspecified: Secondary | ICD-10-CM | POA: Diagnosis not present

## 2016-07-26 DIAGNOSIS — J849 Interstitial pulmonary disease, unspecified: Secondary | ICD-10-CM

## 2016-07-26 DIAGNOSIS — I5032 Chronic diastolic (congestive) heart failure: Secondary | ICD-10-CM | POA: Diagnosis not present

## 2016-07-26 NOTE — Progress Notes (Signed)
Daily Session Note  Patient Details  Name: Julie Page MRN: [id number] Date of Birth: [birth year] Referring Provider:   [name] Pulmonary Rehab Walk Test from 06/09/2016 in [place]  Referring Provider  Dr. Chase Caller      Encounter Date: 07/26/2016  Check In:     Session Check In - 07/26/16 1338      Check-In   Location [facility]   Staff Present Rosebud Poles, RN, BSN;Tyjanae Bartek Ysidro Evert, Felipe Drone, RN, MHA;[name] diVincenzo, MS, ACSM RCEP, Exercise Physiologist   Supervising physician immediately available to respond to emergencies [facility] immediately available   Physician(s) Dr. Marily Memos   Medication changes reported     No   Fall or balance concerns reported    No   Warm-up and Cool-down Performed as group-led instruction   Resistance Training Performed Yes   VAD Patient? No     Pain Assessment   Currently in Pain? No/denies   Multiple Pain Sites No      Capillary Blood Glucose: No results found for this or any previous visit (from the past 24 hour(s)).      Exercise Prescription Changes - 07/26/16 1500      Response to Exercise   Blood Pressure (Admit) 102/60   Blood Pressure (Exercise) 130/64   Blood Pressure (Exit) 96/60   Heart Rate (Admit) 88 bpm   Heart Rate (Exercise) 103 bpm   Heart Rate (Exit) 83 bpm   Oxygen Saturation (Admit) 96 %   Oxygen Saturation (Exercise) 94 %   Oxygen Saturation (Exit) 97 %   Rating of Perceived Exertion (Exercise) 15   Perceived Dyspnea (Exercise) 2   Duration Progress to 45 minutes of aerobic exercise without signs/symptoms of physical distress   Intensity THRR unchanged     Progression   Progression Continue to progress workloads to maintain intensity without signs/symptoms of physical distress.     Resistance Training   Training Prescription Yes   Weight orange bands   Reps 10-12  10 minutes of strength training     Interval Training   Interval Training No     Oxygen   Oxygen Continuous   Liters 1     NuStep   Level 3   Minutes 17   METs 1.9     Arm Ergometer   Level 1   Minutes 17     Track   Laps 10   Minutes 17     Goals Met:  Exercise tolerated well No report of cardiac concerns or symptoms Strength training completed today  Goals Unmet:  Not Applicable  Comments: Service time is from 1330 to 1500    Dr. Rush Farmer is Medical Director for Pulmonary Rehab at [facility].

## 2016-07-28 ENCOUNTER — Encounter (HOSPITAL_COMMUNITY)
Admission: RE | Admit: 2016-07-28 | Discharge: 2016-07-28 | Disposition: A | Discharge status: Home / Self Care | Payer: Medicare Other | Source: Ambulatory Visit | Attending: Internal Medicine | Admitting: Internal Medicine

## 2016-07-28 VITALS — Wt 125.2 lb

## 2016-07-28 DIAGNOSIS — J849 Interstitial pulmonary disease, unspecified: Secondary | ICD-10-CM

## 2016-07-28 DIAGNOSIS — I7300 Raynaud's syndrome without gangrene: Secondary | ICD-10-CM | POA: Diagnosis not present

## 2016-07-28 DIAGNOSIS — I5032 Chronic diastolic (congestive) heart failure: Secondary | ICD-10-CM | POA: Diagnosis not present

## 2016-07-28 DIAGNOSIS — Z7982 Long term (current) use of aspirin: Secondary | ICD-10-CM | POA: Diagnosis not present

## 2016-07-28 DIAGNOSIS — Z79899 Other long term (current) drug therapy: Secondary | ICD-10-CM | POA: Diagnosis not present

## 2016-07-28 DIAGNOSIS — E7800 Pure hypercholesterolemia, unspecified: Secondary | ICD-10-CM | POA: Diagnosis not present

## 2016-07-28 NOTE — Progress Notes (Signed)
Pulmonary Individual Treatment Plan  Patient Details  Name: Julie Page MRN: [id number] Date of Birth: [birth year] Referring Provider:   Doristine Devoid Pulmonary Rehab Walk Test from 06/09/2016 in [facility]  Referring Provider  Dr. Marchelle Gearing      Initial Encounter Date:  Flowsheet Row Pulmonary Rehab Walk Test from 06/09/2016 in [facility] CARDI[facility] REHAB  Date  06/10/16  Referring Provider  Dr. Marchelle Gearing      Visit Diagnosis: Interstitial lung disease (HCC)  [name] Home Medications on Admission:  No current outpatient prescriptions on file.  Past Medical History: Past Medical History:  Diagnosis Date  . Acute respiratory failure with hypoxia (HCC)   . Anemia    Chronic disease  . CAP (community acquired pneumonia)   . Chronic diastolic heart failure (HCC)   . Hypercholesteremia   . Hypoxia   . ILD (interstitial lung disease) (HCC)   . Leukocytosis   . Malnutrition of moderate degree (HCC)   . Palpitations   . Protein calorie malnutrition (HCC)   . Pulmonary HTN (HCC)   . Raynaud's syndrome   . SIRS (systemic inflammatory response syndrome) (HCC)     Tobacco Use: History  Smoking Status  . Never Smoker  Smokeless Tobacco  . Never Used    Labs: Recent Review Flowsheet Data    There is no flowsheet data to display.      Capillary Blood Glucose: No results found for: GLUCAP   ADL UCSD:     Pulmonary Assessment Scores    Row Name 06/10/16 [phone] 06/23/16 1344       ADL UCSD   ADL Phase  - Entry    SOB Score total  - 75      mMRC Score   mMRC Score 3  -       Pulmonary Function Assessment:     Pulmonary Function Assessment - 06/03/16 1620      Breath   Bilateral Breath Sounds Other  fine crackles on the lower bases, otherwise clear   Shortness of Breath Yes;Fear of Shortness of Breath;Limiting activity      Exercise Target Goals:    Exercise Program Goal: Individual exercise  prescription set with THRR, safety & activity barriers. Participant demonstrates ability to understand and report RPE using BORG scale, to self-measure pulse accurately, and to acknowledge the importance of the exercise prescription.  Exercise Prescription Goal: Starting with aerobic activity 30 plus minutes a day, 3 days per week for initial exercise prescription. Provide home exercise prescription and guidelines that participant acknowledges understanding prior to discharge.  Activity Barriers & Risk Stratification:     Activity Barriers & Cardiac Risk Stratification - 06/03/16 1619      Activity Barriers & Cardiac Risk Stratification   Activity Barriers Deconditioning;Shortness of Breath;Assistive Device      6 Minute Walk:     6 Minute Walk    Row Name 06/10/16 0826         6 Minute Walk   Phase Initial     Distance 800 feet     Walk Time 6 minutes     # of Rest Breaks 0     MPH 1.5     METS 2.15     RPE 12     Perceived Dyspnea  1     Symptoms No     Resting HR 101 bpm     Resting BP 110/64     Max Ex. HR 117 bpm  Max Ex. BP 98/60       Interval HR   Baseline HR 101     1 Minute HR 109     2 Minute HR 115     3 Minute HR 117     4 Minute HR 109     5 Minute HR 108     6 Minute HR 107     2 Minute Post HR 107     Interval Heart Rate? Yes       Interval Oxygen   Baseline Oxygen Saturation % 98 %     Baseline Liters of Oxygen 1 L     1 Minute Oxygen Saturation % 98 %     1 Minute Liters of Oxygen 1 L     2 Minute Oxygen Saturation % 93 %     2 Minute Liters of Oxygen 1 L     3 Minute Oxygen Saturation % 91 %     3 Minute Liters of Oxygen 1 L     4 Minute Oxygen Saturation % 93 %     4 Minute Liters of Oxygen 1 L     5 Minute Oxygen Saturation % 93 %     5 Minute Liters of Oxygen 1 L     6 Minute Oxygen Saturation % 94 %     6 Minute Liters of Oxygen 1 L     2 Minute Post Oxygen Saturation % 99 %     2 Minute Post Liters of Oxygen 1 L         Initial Exercise Prescription:     Initial Exercise Prescription - 06/10/16 0800      Date of Initial Exercise RX and Referring Provider   Date 06/10/16   Referring Provider Dr. Marchelle Gearing     Oxygen   Oxygen Continuous   Liters 1     Prescription Details   Frequency (times per week) 2   Duration Progress to 45 minutes of aerobic exercise without signs/symptoms of physical distress     Intensity   THRR 40-80% of Max Heartrate 62-124   Ratings of Perceived Exertion 11-13   Perceived Dyspnea 0-4     Progression   Progression Continue progressive overload as per policy without signs/symptoms or physical distress.     Resistance Training   Training Prescription Yes   Weight Orange bands   Reps 10-12      Perform Capillary Blood Glucose checks as needed.  Exercise Prescription Changes:     Exercise Prescription Changes    Row Name 06/16/16 1700 06/23/16 1600 06/28/16 1600 06/30/16 1500 07/05/16 1500     Exercise Review   Progression  -  -  - Yes  -     Response to Exercise   Blood Pressure (Admit) 120/60 120/70 98/54 132/70 96/52   Blood Pressure (Exercise) 112/70 140/76 126/70 130/70 120/66   Blood Pressure (Exit) 110/60 110/70 100/56 106/50 102/62   Heart Rate (Admit) 97 bpm 87 bpm 90 bpm 93 bpm 81 bpm   Heart Rate (Exercise) 96 bpm 89 bpm 93 bpm 86 bpm 88 bpm   Heart Rate (Exit) 89 bpm 93 bpm 80 bpm 84 bpm 78 bpm   Oxygen Saturation (Admit) 98 % 98 % 97 % 98 % 98 %   Oxygen Saturation (Exercise) 96 % 94 % 94 % 91 % 93 %   Oxygen Saturation (Exit) 96 % 91 % 98 % 98 % 100 %   Rating of  Perceived Exertion (Exercise) 14 15 15 13 13    Perceived Dyspnea (Exercise) 2 2 1 2 2    Duration Progress to 45 minutes of aerobic exercise without signs/symptoms of physical distress Progress to 45 minutes of aerobic exercise without signs/symptoms of physical distress Progress to 45 minutes of aerobic exercise without signs/symptoms of physical distress Progress to 45 minutes  of aerobic exercise without signs/symptoms of physical distress Progress to 45 minutes of aerobic exercise without signs/symptoms of physical distress   Intensity THRR New THRR New THRR New THRR unchanged THRR unchanged     Progression   Progression  -  -  - Continue to progress workloads to maintain intensity without signs/symptoms of physical distress. Continue to progress workloads to maintain intensity without signs/symptoms of physical distress.     Resistance Training   Training Prescription Yes Yes Yes Yes Yes   Weight Orange bands Orange bands Orange bands orange bands orange bands   Reps 10-12 10-12 10-12 10-12 10-12  10 minutes of strength training     Interval Training   Interval Training No No No No No     Oxygen   Oxygen Continuous Continuous Continuous Continuous Continuous   Liters 1 1 1 1 1      NuStep   Level  -  - 1 2 2    Minutes  -  - 17 17 17    METs  -  - 1.7 1.6 1.7     Arm Ergometer   Level 1 1 1 1 1    Minutes 17 17 17 17 17      Track   Laps 4 7 8   - 9   Minutes 17 17 17   - 17   Row Name 07/07/16 1500 07/12/16 1543 07/14/16 1542 07/19/16 1600 07/21/16 1546     Exercise Review   Progression Yes  -  -  -  -     Response to Exercise   Blood Pressure (Admit) 104/50 110/56 95/64 90/44  104/80   Blood Pressure (Exercise) 110/60 126/60 98/60 110/60 118/62   Blood Pressure (Exit) 100/60 102/60 112/60 112/64 108/56   Heart Rate (Admit) 88 bpm 96 bpm 85 bpm 81 bpm 87 bpm   Heart Rate (Exercise) 81 bpm 91 bpm 80 bpm 84 bpm 94 bpm   Heart Rate (Exit) 76 bpm 79 bpm 75 bpm 75 bpm 85 bpm   Oxygen Saturation (Admit) 95 % 97 % 98 % 98 % 98 %   Oxygen Saturation (Exercise) 94 % 96 % 92 % 96 % 95 %   Oxygen Saturation (Exit) 99 % 98 % 99 % 97 % 98 %   Rating of Perceived Exertion (Exercise) 14 15 15 17 15    Perceived Dyspnea (Exercise) 2 2 2 2 2    Duration Progress to 45 minutes of aerobic exercise without signs/symptoms of physical distress Progress to 45 minutes of  aerobic exercise without signs/symptoms of physical distress Progress to 45 minutes of aerobic exercise without signs/symptoms of physical distress Progress to 45 minutes of aerobic exercise without signs/symptoms of physical distress Progress to 45 minutes of aerobic exercise without signs/symptoms of physical distress   Intensity THRR unchanged THRR unchanged THRR unchanged THRR unchanged THRR unchanged     Progression   Progression Continue to progress workloads to maintain intensity without signs/symptoms of physical distress. Continue to progress workloads to maintain intensity without signs/symptoms of physical distress. Continue to progress workloads to maintain intensity without signs/symptoms of physical distress. Continue to progress workloads to maintain intensity  without signs/symptoms of physical distress. Continue to progress workloads to maintain intensity without signs/symptoms of physical distress.     Resistance Training   Training Prescription Yes Yes Yes Yes Yes   Weight orange bands orange bands orange bands orange bands orange bands   Reps 10-12  10 minutes of strength training 10-12  10 minutes of strength training 10-12  10 minutes of strength training 10-12  10 minutes of strength training 10-12  10 minutes of strength training     Interval Training   Interval Training No No No No No     Oxygen   Oxygen Continuous Continuous Continuous Continuous Continuous   Liters 1 1 1 1 1      NuStep   Level 3 3  - 3 3   Minutes 17 17  - 17 17   METs 1.7 1.9  - 1.9 1.9     Arm Ergometer   Level  - 1 1 1 1    Minutes  - 17 17 17 17      Track   Laps 8 8 8 7   -   Minutes 17 17 17 17   -   Row Name 07/26/16 1500             Response to Exercise   Blood Pressure (Admit) 102/60       Blood Pressure (Exercise) 130/64       Blood Pressure (Exit) 96/60       Heart Rate (Admit) 88 bpm       Heart Rate (Exercise) 103 bpm       Heart Rate (Exit) 83 bpm       Oxygen  Saturation (Admit) 96 %       Oxygen Saturation (Exercise) 94 %       Oxygen Saturation (Exit) 97 %       Rating of Perceived Exertion (Exercise) 15       Perceived Dyspnea (Exercise) 2       Duration Progress to 45 minutes of aerobic exercise without signs/symptoms of physical distress       Intensity THRR unchanged         Progression   Progression Continue to progress workloads to maintain intensity without signs/symptoms of physical distress.         Resistance Training   Training Prescription Yes       Weight orange bands       Reps 10-12  10 minutes of strength training         Interval Training   Interval Training No         Oxygen   Oxygen Continuous       Liters 1         NuStep   Level 3       Minutes 17       METs 1.9         Arm Ergometer   Level 1       Minutes 17         Track   Laps 10       Minutes 17          Exercise Comments:     Exercise Comments    Row Name 06/30/16 0834 07/28/16 0820         Exercise Comments Has attended 3 exercise sessions, too early to see progress, tolerating exercise well. Patient is slightly resistant to increasing intensity. Will continue to monitor and progress when appropriate.  Discharge Exercise Prescription (Final Exercise Prescription Changes):     Exercise Prescription Changes - 07/26/16 1500      Response to Exercise   Blood Pressure (Admit) 102/60   Blood Pressure (Exercise) 130/64   Blood Pressure (Exit) 96/60   Heart Rate (Admit) 88 bpm   Heart Rate (Exercise) 103 bpm   Heart Rate (Exit) 83 bpm   Oxygen Saturation (Admit) 96 %   Oxygen Saturation (Exercise) 94 %   Oxygen Saturation (Exit) 97 %   Rating of Perceived Exertion (Exercise) 15   Perceived Dyspnea (Exercise) 2   Duration Progress to 45 minutes of aerobic exercise without signs/symptoms of physical distress   Intensity THRR unchanged     Progression   Progression Continue to progress workloads to maintain intensity without  signs/symptoms of physical distress.     Resistance Training   Training Prescription Yes   Weight orange bands   Reps 10-12  10 minutes of strength training     Interval Training   Interval Training No     Oxygen   Oxygen Continuous   Liters 1     NuStep   Level 3   Minutes 17   METs 1.9     Arm Ergometer   Level 1   Minutes 17     Track   Laps 10   Minutes 17       Nutrition:  Target Goals: Understanding of nutrition guidelines, daily intake of sodium 1500mg , cholesterol 200mg , calories 30% from fat and 7% or less from saturated fats, daily to have 5 or more servings of fruits and vegetables.  Biometrics:     Pre Biometrics - 06/10/16 0833      Pre Biometrics   Grip Strength 22 kg       Nutrition Therapy Plan and Nutrition Goals:     Nutrition Therapy & Goals - 07/19/16 1534      Nutrition Therapy   Diet Low Sodium     Personal Nutrition Goals   Personal Goal #1 Prevent further weight loss/maintain current weight while in Pulmonary Rehab     Intervention Plan   Intervention Prescribe, educate and counsel regarding individualized specific dietary modifications aiming towards targeted core components such as weight, hypertension, lipid management, diabetes, heart failure and other comorbidities.   Expected Outcomes Short Term Goal: Understand basic principles of dietary content, such as calories, fat, sodium, cholesterol and nutrients.;Long Term Goal: Adherence to prescribed nutrition plan.      Nutrition Discharge: Rate Your Plate Scores:     Nutrition Assessments - 07/18/16 1541      Rate Your Plate Scores   Pre Score 57      Psychosocial: Target Goals: Acknowledge presence or absence of depression, maximize coping skills, provide positive support system. Participant is able to verbalize types and ability to use techniques and skills needed for reducing stress and depression.  Initial Review & Psychosocial Screening:     Initial Psych  Review & Screening - 06/03/16 1623      Family Dynamics   Good Support System? Yes     Screening Interventions   Interventions Encouraged to exercise      Quality of Life Scores:     Quality of Life - 06/23/16 1344      Quality of Life Scores   Health/Function Pre 7.39 %   Socioeconomic Pre 11.56 %   Psych/Spiritual Pre 6.14 %   Family Pre 26 %   GLOBAL Pre 9.91 %  PHQ-9: Recent Review Flowsheet Data    Depression screen [facility] 2/9 06/03/2016 03/13/2016   Decreased Interest 1 0   Down, Depressed, Hopeless 1 0   PHQ - 2 Score 2 0   Altered sleeping 0 -   Tired, decreased energy 1 -   Change in appetite 0 -   Feeling bad or failure about yourself  1 -   Trouble concentrating 0 -   Moving slowly or fidgety/restless 0 -   Suicidal thoughts 0 -   PHQ-9 Score 4 -   Difficult doing work/chores Not difficult at all -      Psychosocial Evaluation and Intervention:   Psychosocial Re-Evaluation:     Psychosocial Re-Evaluation    Row Name 06/27/16 1141 07/25/16 1548           Psychosocial Re-Evaluation   Interventions Encouraged to attend Pulmonary Rehabilitation for the exercise  -      Comments No psychosocial concerns identified at this time. -  no concerns identified      Continued Psychosocial Services Needed No No        Education: Education Goals: Education classes will be provided on a weekly basis, covering required topics. Participant will state understanding/return demonstration of topics presented.  Learning Barriers/Preferences:     Learning Barriers/Preferences - 06/03/16 1620      Learning Barriers/Preferences   Learning Barriers None   Learning Preferences Group Instruction;Individual Instruction;Written Material      Education Topics: Risk Factor Reduction:  -Group instruction that is supported by a PowerPoint presentation. Instructor discusses the definition of a risk factor, different risk factors for pulmonary disease, and how the  heart and lungs work together.     Nutrition for Pulmonary Patient:  -Group instruction provided by PowerPoint slides, verbal discussion, and written materials to support subject matter. The instructor gives an explanation and review of healthy diet recommendations, which includes a discussion on weight management, recommendations for fruit and vegetable consumption, as well as protein, fluid, caffeine, fiber, sodium, sugar, and alcohol. Tips for eating when patients are short of breath are discussed.   Pursed Lip Breathing:  -Group instruction that is supported by demonstration and informational handouts. Instructor discusses the benefits of pursed lip and diaphragmatic breathing and detailed demonstration on how to preform both.   Flowsheet Row PULMONARY REHAB OTHER RESPIRATORY from 07/14/2016 in [facility]ARDIAC REHAB  Date  07/14/16  Educator  EP  Instruction Review Code  2- meets goals/outcomes      Oxygen Safety:  -Group instruction provided by PowerPoint, verbal discussion, and written material to support subject matter. There is an overview of "What is Oxygen" and "Why do we need it".  Instructor also reviews how to create a safe environment for oxygen use, the importance of using oxygen as prescribed, and the risks of noncompliance. There is a brief discussion on traveling with oxygen and resources the patient may utilize.   Oxygen Equipment:  -Group instruction provided by [facility] Staff utilizing handouts, written materials, and equipment demonstrations. Flowsheet Row PULMONARY REHAB OTHER RESPIRATORY from 07/14/2016 in [facility]ARD[facility] REHAB  Date  06/16/16  Educator  Patsy Lager rep  Instruction Review Code  2- meets goals/outcomes      Signs and Symptoms:  -Group instruction provided by written material and verbal discussion to support subject matter. Warning signs and symptoms of infection, stroke, and heart attack are reviewed and  when to call the physician/911 reinforced. Tips for preventing the spread  of infection discussed. Flowsheet Row PULMONARY REHAB OTHER RESPIRATORY from 07/14/2016 in [facility] CARDIAC REHAB  Date  06/30/16  Educator  RN  Instruction Review Code  2- meets goals/outcomes      Advanced Directives:  -Group instruction provided by verbal instruction and written material to support subject matter. Instructor reviews Advanced Directive laws and proper instruction for filling out document.   Pulmonary Video:  -Group video education that reviews the importance of medication and oxygen compliance, exercise, good nutrition, pulmonary hygiene, and pursed lip and diaphragmatic breathing for the pulmonary patient.   Exercise for the Pulmonary Patient:  -Group instruction that is supported by a PowerPoint presentation. Instructor discusses benefits of exercise, core components of exercise, frequency, duration, and intensity of an exercise routine, importance of utilizing pulse oximetry during exercise, safety while exercising, and options of places to exercise outside of rehab.   Flowsheet Row PULMONARY REHAB OTHER RESPIRATORY from 07/14/2016 in [facility] CARDIAC REHAB  Date  06/23/16  Educator  EP  Instruction Review Code  2- meets goals/outcomes      Pulmonary Medications:  -Verbally interactive group education provided by instructor with focus on inhaled medications and proper administration.   Anatomy and Physiology of the Respiratory System and Intimacy:  -Group instruction provided by PowerPoint, verbal discussion, and written material to support subject matter. Instructor reviews respiratory cycle and anatomical components of the respiratory system and their functions. Instructor also reviews differences in obstructive and restrictive respiratory diseases with examples of each. Intimacy, Sex, and Sexuality differences are reviewed with a discussion on how  relationships can change when diagnosed with pulmonary disease. Common sexual concerns are reviewed. Flowsheet Row PULMONARY REHAB OTHER RESPIRATORY from 07/14/2016 in [facility] CARDIAC REHAB  Date  07/07/16  Educator  RN  Instruction Review Code  2- meets goals/outcomes      Knowledge Questionnaire Score:     Knowledge Questionnaire Score - 06/23/16 1343      Knowledge Questionnaire Score   Pre Score 12/13      Core Components/Risk Factors/Patient Goals at Admission:     Personal Goals and Risk Factors at Admission - 06/03/16 1621      Core Components/Risk Factors/Patient Goals on Admission   Increase Strength and Stamina Yes   Intervention Provide advice, education, support and counseling about physical activity/exercise needs.;Develop an individualized exercise prescription for aerobic and resistive training based on initial evaluation findings, risk stratification, comorbidities and participant's personal goals.   Expected Outcomes Achievement of increased cardiorespiratory fitness and enhanced flexibility, muscular endurance and strength shown through measurements of functional capacity and personal statement of participant.   Improve shortness of breath with ADL's Yes   Intervention Provide education, individualized exercise plan and daily activity instruction to help decrease symptoms of SOB with activities of daily living.   Expected Outcomes Short Term: Achieves a reduction of symptoms when performing activities of daily living.   Develop more efficient breathing techniques such as purse lipped breathing and diaphragmatic breathing; and practicing self-pacing with activity Yes   Intervention Provide education, demonstration and support about specific breathing techniuqes utilized for more efficient breathing. Include techniques such as pursed lipped breathing, diaphragmatic breathing and self-pacing activity.   Expected Outcomes Short Term: Participant will  be able to demonstrate and use breathing techniques as needed throughout daily activities.   Increase knowledge of respiratory medications and ability to use respiratory devices properly  Yes   Intervention Provide education and demonstration as needed  of appropriate use of medications, inhalers, and oxygen therapy.   Expected Outcomes Short Term: Achieves understanding of medications use. Understands that oxygen is a medication prescribed by physician. Demonstrates appropriate use of inhaler and oxygen therapy.      Core Components/Risk Factors/Patient Goals Review:      Goals and Risk Factor Review    Row Name 06/27/16 1136 07/25/16 1545           Core Components/Risk Factors/Patient Goals Review   Personal Goals Review Increase Strength and Stamina;Improve shortness of breath with ADL's;Develop more efficient breathing techniques such as purse lipped breathing and diaphragmatic breathing and practicing self-pacing with activity.  -      Review Has only attended less than 5 exercise sessions, it is too early to see improvement in strength and stamina has attended 10 exercise sessions, slowly increasing her workloads, still no improvement with in strength and stamina.  She is very weak has has many "bad" days where she is very tired      Expected Outcomes She should should see a noticeable improvement in the next 30 days. Hopefully will see an improvement in stamina in next 30 days.         Core Components/Risk Factors/Patient Goals at Discharge (Final Review):      Goals and Risk Factor Review - 07/25/16 1545      Core Components/Risk Factors/Patient Goals Review   Review has attended 10 exercise sessions, slowly increasing her workloads, still no improvement with in strength and stamina.  She is very weak has has many "bad" days where she is very tired   Expected Outcomes Hopefully will see an improvement in stamina in next 30 days.      ITP Comments:   Comments: ITP  REVIEW Pt is making expected progress toward personal goals after completing 11sessions.   Recommend continued exercise, life style modification, education, and utilization of breathing techniques to increase stamina and strength and decrease shortness of breath with exertion.

## 2016-07-28 NOTE — Progress Notes (Signed)
Daily Session Note  Patient Details  Name: Julie Page MRN: [id number] Date of Birth: 04/01/1951 Referring Provider:   [name] Pulmonary Rehab Walk Test from 06/09/2016 in [place]  Referring Provider  Dr. Chase Caller      Encounter Date: 07/28/2016  Check In:     Session Check In - 07/28/16 1408      Check-In   Location [facility]   Staff Present Rosebud Poles, RN, BSN;[name] diVincenzo, MS, ACSM RCEP, Exercise Physiologist;Lisa Ysidro Evert, RN   Supervising physician immediately available to respond to emergencies [facility] immediately available   Physician(s) Dr. Marthenia Rolling   Medication changes reported     No   Fall or balance concerns reported    No   Warm-up and Cool-down Performed as group-led instruction   Resistance Training Performed Yes   VAD Patient? No     Pain Assessment   Currently in Pain? No/denies   Multiple Pain Sites No      Capillary Blood Glucose: No results found for this or any previous visit (from the past 24 hour(s)).      Exercise Prescription Changes - 07/28/16 1600      Exercise Review   Progression Yes     Response to Exercise   Blood Pressure (Admit) 120/66   Blood Pressure (Exercise) 110/70   Blood Pressure (Exit) 104/70   Heart Rate (Admit) 89 bpm   Heart Rate (Exercise) 83 bpm   Heart Rate (Exit) 86 bpm   Oxygen Saturation (Admit) 99 %   Oxygen Saturation (Exercise) 92 %   Oxygen Saturation (Exit) 98 %   Rating of Perceived Exertion (Exercise) 14   Perceived Dyspnea (Exercise) 1   Duration Progress to 45 minutes of aerobic exercise without signs/symptoms of physical distress   Intensity THRR unchanged     Progression   Progression Continue to progress workloads to maintain intensity without signs/symptoms of physical distress.     Resistance Training   Training Prescription Yes   Weight orange bands   Reps 10-12  10 mins of strength training     Interval  Training   Interval Training No     Oxygen   Oxygen Continuous   Liters 1     NuStep   Level 4   Minutes 17   METs 2.3     Track   Laps 11   Minutes 17     Goals Met:  Exercise tolerated well Strength training completed today  Goals Unmet:  Not Applicable  Comments: Service time is from 1330 to 1530    Dr. Rush Farmer is Medical Director for Pulmonary Rehab at [facility].

## 2016-08-02 ENCOUNTER — Telehealth: Payer: Self-pay | Admitting: Internal Medicine

## 2016-08-02 ENCOUNTER — Encounter (HOSPITAL_COMMUNITY)
Admission: RE | Admit: 2016-08-02 | Discharge: 2016-08-02 | Disposition: A | Discharge status: Home / Self Care | Payer: Medicare Other | Source: Ambulatory Visit | Attending: Internal Medicine | Admitting: Internal Medicine

## 2016-08-02 VITALS — Wt 124.3 lb

## 2016-08-02 DIAGNOSIS — I5032 Chronic diastolic (congestive) heart failure: Secondary | ICD-10-CM | POA: Diagnosis not present

## 2016-08-02 DIAGNOSIS — Z7982 Long term (current) use of aspirin: Secondary | ICD-10-CM | POA: Diagnosis not present

## 2016-08-02 DIAGNOSIS — J849 Interstitial pulmonary disease, unspecified: Secondary | ICD-10-CM

## 2016-08-02 DIAGNOSIS — E7800 Pure hypercholesterolemia, unspecified: Secondary | ICD-10-CM | POA: Diagnosis not present

## 2016-08-02 DIAGNOSIS — Z79899 Other long term (current) drug therapy: Secondary | ICD-10-CM | POA: Diagnosis not present

## 2016-08-02 DIAGNOSIS — I7300 Raynaud's syndrome without gangrene: Secondary | ICD-10-CM | POA: Diagnosis not present

## 2016-08-02 NOTE — Progress Notes (Signed)
I have reviewed a Home Exercise Prescription with Julie Page . Julie Page is currently exercising at home.  The patient was advised to walk 2-3 days a week for 25-30 minutes.  Julie Jonesarolyn and I discussed how to progress their exercise prescription.  The patient stated that their goals were to increase endurance, preform ADL's easier, and possibly get off of o2.  The patient stated that they understand the exercise prescription.  We reviewed exercise guidelines, target heart rate during exercise, oxygen use, weather, home pulse oximeter, endpoints for exercise, and goals.  Patient is encouraged to come to me with any questions. I will continue to follow up with the patient to assist them with progression and safety.

## 2016-08-02 NOTE — Telephone Encounter (Signed)
atc [name] at [facility], pulm rehab is currently closed.   wcb tomorrow.

## 2016-08-02 NOTE — Progress Notes (Signed)
Daily Session Note  Patient Details  Name: Julie Page MRN: [id number] Date of Birth: 09/25/1951 Referring Provider:   [name] Pulmonary Rehab Walk Test from 06/09/2016 in [place]  Referring Provider  Dr. Chase Caller      Encounter Date: 08/02/2016  Check In:     Session Check In - 08/02/16 1348      Check-In   [facility]   Staff Present Rosebud Poles, RN, BSN;[name] diVincenzo, MS, ACSM RCEP, Exercise Physiologist;Mariko Nowakowski Ysidro Evert, Felipe Drone, RN, MHA;Portia Rollene Rotunda, RN, BSN   Supervising physician immediately available to respond to emergencies [facility] immediately available   Physician(s) Dr. Marily Memos   Medication changes reported     No   Fall or balance concerns reported    No   Warm-up and Cool-down Performed as group-led instruction   Resistance Training Performed Yes   VAD Patient? No     Pain Assessment   Currently in Pain? No/denies   Multiple Pain Sites No      Capillary Blood Glucose: No results found for this or any previous visit (from the past 24 hour(s)).      Exercise Prescription Changes - 08/02/16 1500      Response to Exercise   Blood Pressure (Admit) 98/60   Blood Pressure (Exercise) 140/70   Blood Pressure (Exit) 94/60   Heart Rate (Admit) 87 bpm   Heart Rate (Exercise) 111 bpm   Heart Rate (Exit) 77 bpm   Oxygen Saturation (Admit) 98 %   Oxygen Saturation (Exercise) 95 %   Oxygen Saturation (Exit) 99 %   Rating of Perceived Exertion (Exercise) 16   Perceived Dyspnea (Exercise) 3   Duration Progress to 45 minutes of aerobic exercise without signs/symptoms of physical distress   Intensity THRR unchanged     Progression   Progression Continue to progress workloads to maintain intensity without signs/symptoms of physical distress.     Resistance Training   Training Prescription Yes   Weight orange bands   Reps 10-12  10 minutes of srength training     Interval Training   Interval Training No     Oxygen   Oxygen Continuous   Liters 1     NuStep   Level 3   Minutes 17   METs 1.9     Arm Ergometer   Level 1   Minutes 17     Track   Laps 10   Minutes 17     Goals Met:  Exercise tolerated well No report of cardiac concerns or symptoms Strength training completed today  Goals Unmet:  Not Applicable  Comments: Service time is from 1330 to 1510    Dr. Rush Farmer is Medical Director for Pulmonary Rehab at [facility].

## 2016-08-03 NOTE — Telephone Encounter (Signed)
lmtcb X1 for [name]

## 2016-08-04 ENCOUNTER — Encounter (HOSPITAL_COMMUNITY)
Admission: RE | Admit: 2016-08-04 | Discharge: 2016-08-04 | Disposition: A | Discharge status: Home / Self Care | Payer: Medicare Other | Source: Ambulatory Visit | Attending: Internal Medicine | Admitting: Internal Medicine

## 2016-08-04 VITALS — Wt 124.8 lb

## 2016-08-04 DIAGNOSIS — Z7982 Long term (current) use of aspirin: Secondary | ICD-10-CM | POA: Diagnosis not present

## 2016-08-04 DIAGNOSIS — I7300 Raynaud's syndrome without gangrene: Secondary | ICD-10-CM | POA: Diagnosis not present

## 2016-08-04 DIAGNOSIS — Z79899 Other long term (current) drug therapy: Secondary | ICD-10-CM | POA: Diagnosis not present

## 2016-08-04 DIAGNOSIS — E7800 Pure hypercholesterolemia, unspecified: Secondary | ICD-10-CM | POA: Diagnosis not present

## 2016-08-04 DIAGNOSIS — J849 Interstitial pulmonary disease, unspecified: Secondary | ICD-10-CM

## 2016-08-04 DIAGNOSIS — I5032 Chronic diastolic (congestive) heart failure: Secondary | ICD-10-CM | POA: Diagnosis not present

## 2016-08-04 NOTE — Telephone Encounter (Signed)
[name] returning call [phone].[name]

## 2016-08-04 NOTE — Telephone Encounter (Signed)
Left message for molly to call back.

## 2016-08-04 NOTE — Progress Notes (Signed)
Daily Session Note  Patient Details  Name: Julie Page MRN: [id number] Date of Birth: March 07, 1951 Referring Provider:   [name] Pulmonary Rehab Walk Test from 06/09/2016 in [place]  Referring Provider  Dr. Chase Caller      Encounter Date: 08/04/2016  Check In:     Session Check In - 08/04/16 1243      Check-In   [facility]   Staff Present Rosebud Poles, RN, BSN;[name] diVincenzo, MS, ACSM RCEP, Exercise Physiologist;Lisa Ysidro Evert, Felipe Drone, RN, MHA;Portia Rollene Rotunda, RN, BSN   Supervising physician immediately available to respond to emergencies [facility] immediately available   Physician(s) Dr. Marthenia Rolling   Medication changes reported     No   Warm-up and Cool-down Performed as group-led instruction   Resistance Training Performed Yes   VAD Patient? No     Pain Assessment   Currently in Pain? No/denies   Multiple Pain Sites No      Capillary Blood Glucose: No results found for this or any previous visit (from the past 24 hour(s)).      Exercise Prescription Changes - 08/04/16 1500      Exercise Review   Progression Yes     Response to Exercise   Blood Pressure (Admit) 112/60   Blood Pressure (Exercise) 122/62   Blood Pressure (Exit) 100/50   Heart Rate (Admit) 66 bpm   Heart Rate (Exercise) 107 bpm   Heart Rate (Exit) 88 bpm   Oxygen Saturation (Admit) 96 %   Oxygen Saturation (Exercise) 92 %   Oxygen Saturation (Exit) 98 %   Rating of Perceived Exertion (Exercise) 15   Perceived Dyspnea (Exercise) 2   Duration Progress to 45 minutes of aerobic exercise without signs/symptoms of physical distress   Intensity THRR unchanged     Progression   Progression Continue to progress workloads to maintain intensity without signs/symptoms of physical distress.     Resistance Training   Training Prescription Yes   Weight orange bands   Reps 10-12  10 minutes of strength training     Interval Training   Interval Training No     Oxygen   Oxygen Continuous   Liters 1     NuStep   Level 4   Minutes 17   METs 2.4     Arm Ergometer   Level 1   Minutes 17     Goals Met:  Exercise tolerated well Strength training completed today  Goals Unmet:  Not Applicable  Comments: Service time is from 1215 to 1435.  She attended the Q & A session with Dr. Nelda Marseille the medical director.    Dr. Rush Farmer is Medical Director for Pulmonary Rehab at [facility].

## 2016-08-05 NOTE — Telephone Encounter (Signed)
Ok for portable o2 concentrator  Dr. Kalman ShanMurali Liticia Gasior, M.D., [facility] Pul[facility] Critical Care Medicine Staff Physician [facility] Pager: [phone], If no answer or between  15:00h - 7:00h: call [phone]  08/05/2016 1:49 PM

## 2016-08-05 NOTE — Telephone Encounter (Signed)
Order entered for POC per MR Patient notified.  [facility]

## 2016-08-05 NOTE — Telephone Encounter (Signed)
[place] and spoke with [place] [facility].  She stated that the pt is currently doing rehab with them and she is using 1 liter with exercise.  Pt is requesting to get a POC to use at home instead of the large heavy tanks.  MR please advise. thanks

## 2016-08-09 ENCOUNTER — Encounter (HOSPITAL_COMMUNITY)
Admission: RE | Admit: 2016-08-09 | Discharge: 2016-08-09 | Disposition: A | Discharge status: Home / Self Care | Payer: Medicare Other | Source: Ambulatory Visit | Attending: Internal Medicine | Admitting: Internal Medicine

## 2016-08-09 VITALS — Wt 124.3 lb

## 2016-08-09 DIAGNOSIS — Z79899 Other long term (current) drug therapy: Secondary | ICD-10-CM | POA: Diagnosis not present

## 2016-08-09 DIAGNOSIS — J849 Interstitial pulmonary disease, unspecified: Secondary | ICD-10-CM | POA: Diagnosis not present

## 2016-08-09 DIAGNOSIS — Z7982 Long term (current) use of aspirin: Secondary | ICD-10-CM | POA: Diagnosis not present

## 2016-08-09 DIAGNOSIS — I5032 Chronic diastolic (congestive) heart failure: Secondary | ICD-10-CM | POA: Diagnosis not present

## 2016-08-09 DIAGNOSIS — I7300 Raynaud's syndrome without gangrene: Secondary | ICD-10-CM | POA: Diagnosis not present

## 2016-08-09 DIAGNOSIS — E7800 Pure hypercholesterolemia, unspecified: Secondary | ICD-10-CM | POA: Diagnosis not present

## 2016-08-09 NOTE — Progress Notes (Signed)
Daily Session Note  Patient Details  Name: Julie Page MRN: [id number] Date of Birth: January 10, 1951 Referring Provider:   [name] Pulmonary Rehab Walk Test from 06/09/2016 in [place]  Referring Provider  Dr. Chase Caller      Encounter Date: 08/09/2016  Check In:     Session Check In - 08/09/16 1509      Check-In   [facility]   Staff Present Su Hilt, MS, ACSM RCEP, Exercise Physiologist;Portia Rollene Rotunda, RN, Roque Cash, RN   Supervising physician immediately available to respond to emergencies [facility] immediately available   Physician(s) Dr. Roel Cluck   Medication changes reported     No   Fall or balance concerns reported    No   Warm-up and Cool-down Performed as group-led instruction   Resistance Training Performed Yes   VAD Patient? No     Pain Assessment   Currently in Pain? No/denies   Multiple Pain Sites No      Capillary Blood Glucose: No results found for this or any previous visit (from the past 24 hour(s)).      Exercise Prescription Changes - 08/09/16 1500      Response to Exercise   Blood Pressure (Admit) 92/60   Blood Pressure (Exercise) 120/58   Blood Pressure (Exit) 96/60   Heart Rate (Admit) 79 bpm   Heart Rate (Exercise) 96 bpm   Heart Rate (Exit) 84 bpm   Oxygen Saturation (Admit) 98 %   Oxygen Saturation (Exercise) 95 %   Oxygen Saturation (Exit) 100 %   Rating of Perceived Exertion (Exercise) 17   Perceived Dyspnea (Exercise) 3   Duration Progress to 45 minutes of aerobic exercise without signs/symptoms of physical distress   Intensity THRR unchanged     Progression   Progression Continue to progress workloads to maintain intensity without signs/symptoms of physical distress.     Resistance Training   Training Prescription Yes   Weight orange bands   Reps 10-12  10 minutes of strength training     Interval Training   Interval Training No     Oxygen   Oxygen Continuous   Liters 1     NuStep   Level 4   Minutes 17   METs 2.2     Arm Ergometer   Level 1   Minutes 17     Track   Laps 11   Minutes 17     Goals Met:  Exercise tolerated well No report of cardiac concerns or symptoms Strength training completed today  Goals Unmet:  Not Applicable  Comments: Service time is from 1330 to 1500    Dr. Rush Farmer is Medical Director for Pulmonary Rehab at [facility].

## 2016-08-11 ENCOUNTER — Encounter (HOSPITAL_COMMUNITY)
Admission: RE | Admit: 2016-08-11 | Discharge: 2016-08-11 | Disposition: A | Discharge status: Home / Self Care | Payer: Medicare Other | Source: Ambulatory Visit | Attending: Internal Medicine | Admitting: Internal Medicine

## 2016-08-11 VITALS — Wt 124.8 lb

## 2016-08-11 DIAGNOSIS — I5032 Chronic diastolic (congestive) heart failure: Secondary | ICD-10-CM | POA: Diagnosis not present

## 2016-08-11 DIAGNOSIS — J849 Interstitial pulmonary disease, unspecified: Secondary | ICD-10-CM

## 2016-08-11 DIAGNOSIS — Z7982 Long term (current) use of aspirin: Secondary | ICD-10-CM | POA: Diagnosis not present

## 2016-08-11 DIAGNOSIS — Z79899 Other long term (current) drug therapy: Secondary | ICD-10-CM | POA: Diagnosis not present

## 2016-08-11 DIAGNOSIS — I7300 Raynaud's syndrome without gangrene: Secondary | ICD-10-CM | POA: Diagnosis not present

## 2016-08-11 DIAGNOSIS — E7800 Pure hypercholesterolemia, unspecified: Secondary | ICD-10-CM | POA: Diagnosis not present

## 2016-08-11 NOTE — Progress Notes (Signed)
Daily Session Note  Patient Details  Name: Julie Page MRN: [id number] Date of Birth: 1951-03-23 Referring Provider:   [name] Pulmonary Rehab Walk Test from 06/09/2016 in [place]  Referring Provider  Dr. Chase Caller      Encounter Date: 08/11/2016  Check In:     Session Check In - 08/11/16 1350      Check-In   [facility]   Staff Present Su Hilt, MS, ACSM RCEP, Exercise Physiologist;Portia Rollene Rotunda, RN, Roque Cash, RN   Physician(s) Dr. Allyson Sabal   Medication changes reported     No   Fall or balance concerns reported    No   Warm-up and Cool-down Performed as group-led instruction   Resistance Training Performed Yes   VAD Patient? No     Pain Assessment   Currently in Pain? No/denies   Multiple Pain Sites No      Capillary Blood Glucose: No results found for this or any previous visit (from the past 24 hour(s)).      Exercise Prescription Changes - 08/11/16 1600      Response to Exercise   Blood Pressure (Admit) 106/64   Blood Pressure (Exercise) 128/60   Blood Pressure (Exit) 110/72   Heart Rate (Admit) 83 bpm   Heart Rate (Exercise) 87 bpm   Heart Rate (Exit) 79 bpm   Oxygen Saturation (Admit) 96 %   Oxygen Saturation (Exercise) 98 %   Oxygen Saturation (Exit) 99 %   Rating of Perceived Exertion (Exercise) 16   Perceived Dyspnea (Exercise) 3   Duration Progress to 45 minutes of aerobic exercise without signs/symptoms of physical distress   Intensity THRR unchanged     Progression   Progression Continue to progress workloads to maintain intensity without signs/symptoms of physical distress.     Resistance Training   Training Prescription Yes   Weight orange bands   Reps 10-12  10 minutes of strength training     Interval Training   Interval Training No     Oxygen   Oxygen Continuous   Liters 1     NuStep   Level 4   Minutes 17   METs 2.7     Arm Ergometer   Level  1   Minutes 17     Goals Met:  Exercise tolerated well No report of cardiac concerns or symptoms Strength training completed today  Goals Unmet:  Not Applicable  Comments: Service time is from 1330 to 1530    Dr. Rush Farmer is Medical Director for Pulmonary Rehab at [facility]

## 2016-08-16 ENCOUNTER — Encounter (HOSPITAL_COMMUNITY)
Admission: RE | Admit: 2016-08-16 | Discharge: 2016-08-16 | Disposition: A | Discharge status: Home / Self Care | Payer: Medicare Other | Source: Ambulatory Visit | Attending: Internal Medicine | Admitting: Internal Medicine

## 2016-08-16 VITALS — Wt 125.0 lb

## 2016-08-16 DIAGNOSIS — I7300 Raynaud's syndrome without gangrene: Secondary | ICD-10-CM | POA: Diagnosis not present

## 2016-08-16 DIAGNOSIS — Z7982 Long term (current) use of aspirin: Secondary | ICD-10-CM | POA: Diagnosis not present

## 2016-08-16 DIAGNOSIS — J849 Interstitial pulmonary disease, unspecified: Secondary | ICD-10-CM

## 2016-08-16 DIAGNOSIS — Z79899 Other long term (current) drug therapy: Secondary | ICD-10-CM | POA: Diagnosis not present

## 2016-08-16 DIAGNOSIS — I5032 Chronic diastolic (congestive) heart failure: Secondary | ICD-10-CM | POA: Diagnosis not present

## 2016-08-16 DIAGNOSIS — E7800 Pure hypercholesterolemia, unspecified: Secondary | ICD-10-CM | POA: Diagnosis not present

## 2016-08-16 NOTE — Progress Notes (Signed)
Daily Session Note  Patient Details  Name: Julie Page MRN: [id number] Date of Birth: 11/25/1951 Referring Provider:   [name] Pulmonary Rehab Walk Test from 06/09/2016 in [place]  Referring Provider  Dr. Chase Caller      Encounter Date: 08/16/2016  Check In:     Session Check In - 08/16/16 1322      Check-In   [facility]   Staff Present Rosebud Poles, RN, BSN;[name] diVincenzo, MS, ACSM RCEP, Exercise Physiologist;Quamel Fitzmaurice Ysidro Evert, RN;Portia Rollene Rotunda, RN, BSN   Supervising physician immediately available to respond to emergencies [facility] immediately available   Physician(s) Dr. Marily Memos   Medication changes reported     No   Fall or balance concerns reported    No   Warm-up and Cool-down Performed as group-led instruction   Resistance Training Performed Yes   VAD Patient? No     Pain Assessment   Currently in Pain? No/denies      Capillary Blood Glucose: No results found for this or any previous visit (from the past 24 hour(s)).      Exercise Prescription Changes - 08/16/16 1500      Response to Exercise   Blood Pressure (Admit) 106/66   Blood Pressure (Exercise) 120/64   Blood Pressure (Exit) 64/46   Heart Rate (Admit) 82 bpm   Heart Rate (Exercise) 92 bpm   Heart Rate (Exit) 77 bpm   Oxygen Saturation (Admit) 100 %   Oxygen Saturation (Exercise) 92 %   Oxygen Saturation (Exit) 99 %   Rating of Perceived Exertion (Exercise) 16   Perceived Dyspnea (Exercise) 3   Duration Progress to 45 minutes of aerobic exercise without signs/symptoms of physical distress   Intensity THRR unchanged     Progression   Progression Continue to progress workloads to maintain intensity without signs/symptoms of physical distress.     Resistance Training   Training Prescription Yes   Weight orange bands   Reps 10-12  10 minutes of strength training     Interval Training   Interval Training No     Oxygen    Oxygen Continuous   Liters 1     NuStep   Level 4   Minutes 17   METs 2.3     Arm Ergometer   Level 1   Minutes 17     Track   Laps 8   Minutes 17     Goals Met:  Exercise tolerated well No report of cardiac concerns or symptoms Strength training completed today  Goals Unmet:  Not Applicable  Comments: Service time is from 1330 to 1500    Dr. Rush Farmer is Medical Director for Pulmonary Rehab at [facility].

## 2016-08-18 ENCOUNTER — Encounter (HOSPITAL_COMMUNITY)
Admission: RE | Admit: 2016-08-18 | Discharge: 2016-08-18 | Disposition: A | Discharge status: Home / Self Care | Payer: Medicare Other | Source: Ambulatory Visit | Attending: Internal Medicine | Admitting: Internal Medicine

## 2016-08-18 VITALS — Wt 125.2 lb

## 2016-08-18 DIAGNOSIS — Z79899 Other long term (current) drug therapy: Secondary | ICD-10-CM | POA: Diagnosis not present

## 2016-08-18 DIAGNOSIS — E7800 Pure hypercholesterolemia, unspecified: Secondary | ICD-10-CM | POA: Diagnosis not present

## 2016-08-18 DIAGNOSIS — J849 Interstitial pulmonary disease, unspecified: Secondary | ICD-10-CM | POA: Diagnosis not present

## 2016-08-18 DIAGNOSIS — I7300 Raynaud's syndrome without gangrene: Secondary | ICD-10-CM | POA: Diagnosis not present

## 2016-08-18 DIAGNOSIS — I5032 Chronic diastolic (congestive) heart failure: Secondary | ICD-10-CM | POA: Diagnosis not present

## 2016-08-18 DIAGNOSIS — Z7982 Long term (current) use of aspirin: Secondary | ICD-10-CM | POA: Diagnosis not present

## 2016-08-18 NOTE — Progress Notes (Signed)
Daily Session Note  Patient Details  Name: Julie Page MRN: [id number] Date of Birth: [birth year] Referring Provider:   [name] Pulmonary Rehab Walk Test from 06/09/2016 in [place]  Referring Provider  Dr. Chase Caller      Encounter Date: 08/18/2016  Check In:     Session Check In - 08/18/16 1332      Check-In   [facility]   Staff Present Rosebud Poles, RN, BSN;Kacin Dancy, MS, ACSM RCEP, Exercise Physiologist;Lisa Ysidro Evert, Felipe Drone, RN, MHA;Portia Rollene Rotunda, RN, BSN   Supervising physician immediately available to respond to emergencies [facility] immediately available   Physician(s) Dr. Marily Memos   Medication changes reported     No   Fall or balance concerns reported    No   Warm-up and Cool-down Performed as group-led instruction   Resistance Training Performed Yes   VAD Patient? No     Pain Assessment   Currently in Pain? No/denies   Multiple Pain Sites No      Capillary Blood Glucose: No results found for this or any previous visit (from the past 24 hour(s)).      Exercise Prescription Changes - 08/18/16 1500      Response to Exercise   Blood Pressure (Admit) 104/60   Blood Pressure (Exercise) 132/92   Blood Pressure (Exit) 112/60   Heart Rate (Admit) 82 bpm   Heart Rate (Exercise) 88 bpm   Heart Rate (Exit) 82 bpm   Oxygen Saturation (Admit) 100 %   Oxygen Saturation (Exercise) 96 %   Oxygen Saturation (Exit) 98 %   Rating of Perceived Exertion (Exercise) 16   Perceived Dyspnea (Exercise) 3   Duration Progress to 45 minutes of aerobic exercise without signs/symptoms of physical distress   Intensity THRR unchanged     Progression   Progression Continue to progress workloads to maintain intensity without signs/symptoms of physical distress.     Resistance Training   Training Prescription Yes   Weight orange bands   Reps 10-12  10 minutes of strength training     Interval Training   Interval Training No     Oxygen   Oxygen Continuous   Liters 1     Arm Ergometer   Level 1   Minutes 17     Track   Laps 11   Minutes 17     Goals Met:  Exercise tolerated well No report of cardiac concerns or symptoms Strength training completed today  Goals Unmet:  Not Applicable  Comments: Service time is from 1:30pm to 3:05pm    Dr. Rush Farmer is Medical Director for Pulmonary Rehab at [facility].

## 2016-08-23 ENCOUNTER — Encounter (HOSPITAL_COMMUNITY)
Admission: RE | Admit: 2016-08-23 | Discharge: 2016-08-23 | Disposition: A | Discharge status: Home / Self Care | Payer: Medicare Other | Source: Ambulatory Visit | Attending: Internal Medicine | Admitting: Internal Medicine

## 2016-08-23 VITALS — Wt 125.7 lb

## 2016-08-23 DIAGNOSIS — I5032 Chronic diastolic (congestive) heart failure: Secondary | ICD-10-CM | POA: Insufficient documentation

## 2016-08-23 DIAGNOSIS — Z7982 Long term (current) use of aspirin: Secondary | ICD-10-CM | POA: Insufficient documentation

## 2016-08-23 DIAGNOSIS — Z79899 Other long term (current) drug therapy: Secondary | ICD-10-CM | POA: Insufficient documentation

## 2016-08-23 DIAGNOSIS — I7300 Raynaud's syndrome without gangrene: Secondary | ICD-10-CM | POA: Insufficient documentation

## 2016-08-23 DIAGNOSIS — E7800 Pure hypercholesterolemia, unspecified: Secondary | ICD-10-CM | POA: Diagnosis not present

## 2016-08-23 DIAGNOSIS — J849 Interstitial pulmonary disease, unspecified: Secondary | ICD-10-CM

## 2016-08-23 NOTE — Progress Notes (Signed)
Daily Session Note  Patient Details  Name: Julie Page MRN: [id number] Date of Birth: 10-May-1951 Referring Provider:   [name] Pulmonary Rehab Walk Test from 06/09/2016 in [place]  Referring Provider  Dr. Chase Caller      Encounter Date: 08/23/2016  Check In:     Session Check In - 08/23/16 1342      Check-In   [facility]   Staff Present Rodney Langton, RN;Joan Leonia Reeves, RN, Luisa Hart, RN, BSN;Ramon Dredge, RN, [facility]   Supervising physician immediately available to respond to emergencies [facility] immediately available   Physician(s) Dr. Marily Memos   Medication changes reported     No   Fall or balance concerns reported    No   Warm-up and Cool-down Performed as group-led instruction   Resistance Training Performed Yes   VAD Patient? No     Pain Assessment   Currently in Pain? No/denies   Multiple Pain Sites No      Capillary Blood Glucose: No results found for this or any previous visit (from the past 24 hour(s)).      Exercise Prescription Changes - 08/23/16 1500      Response to Exercise   Blood Pressure (Admit) 118/62   Blood Pressure (Exercise) 110/50   Blood Pressure (Exit) 112/62   Heart Rate (Admit) 84 bpm   Heart Rate (Exercise) 87 bpm   Heart Rate (Exit) 78 bpm   Oxygen Saturation (Admit) 99 %   Oxygen Saturation (Exercise) 96 %   Oxygen Saturation (Exit) 98 %   Rating of Perceived Exertion (Exercise) 16   Perceived Dyspnea (Exercise) 3   Duration Progress to 45 minutes of aerobic exercise without signs/symptoms of physical distress   Intensity THRR unchanged     Progression   Progression Continue to progress workloads to maintain intensity without signs/symptoms of physical distress.     Resistance Training   Training Prescription Yes   Weight orange bands   Reps 10-12  10 minutes of strength training     Interval Training   Interval Training No     Oxygen   Oxygen Continuous   Liters 1     NuStep   Level 4   Minutes 17   METs 2.1     Arm Ergometer   Level 1   Minutes 17     Track   Laps 9   Minutes 17     Goals Met:  Exercise tolerated well No report of cardiac concerns or symptoms Strength training completed today  Goals Unmet:  Not Applicable  Comments: Service time is from 1330 to 1515    Dr. Rush Farmer is Medical Director for Pulmonary Rehab at [facility].

## 2016-08-25 ENCOUNTER — Telehealth (HOSPITAL_COMMUNITY): Payer: Self-pay | Admitting: *Deleted

## 2016-08-25 ENCOUNTER — Encounter (HOSPITAL_COMMUNITY)
Admission: RE | Admit: 2016-08-25 | Discharge: 2016-08-25 | Disposition: A | Discharge status: Home / Self Care | Payer: Medicare Other | Source: Ambulatory Visit | Attending: Internal Medicine | Admitting: Internal Medicine

## 2016-08-25 DIAGNOSIS — J849 Interstitial pulmonary disease, unspecified: Secondary | ICD-10-CM

## 2016-08-30 ENCOUNTER — Encounter (HOSPITAL_COMMUNITY)
Admission: RE | Admit: 2016-08-30 | Discharge: 2016-08-30 | Disposition: A | Discharge status: Home / Self Care | Payer: Medicare Other | Source: Ambulatory Visit | Attending: Internal Medicine | Admitting: Internal Medicine

## 2016-08-30 VITALS — Wt 124.3 lb

## 2016-08-30 DIAGNOSIS — I5032 Chronic diastolic (congestive) heart failure: Secondary | ICD-10-CM | POA: Diagnosis not present

## 2016-08-30 DIAGNOSIS — J849 Interstitial pulmonary disease, unspecified: Secondary | ICD-10-CM | POA: Diagnosis not present

## 2016-08-30 DIAGNOSIS — Z79899 Other long term (current) drug therapy: Secondary | ICD-10-CM | POA: Diagnosis not present

## 2016-08-30 DIAGNOSIS — E7800 Pure hypercholesterolemia, unspecified: Secondary | ICD-10-CM | POA: Diagnosis not present

## 2016-08-30 DIAGNOSIS — Z7982 Long term (current) use of aspirin: Secondary | ICD-10-CM | POA: Diagnosis not present

## 2016-08-30 DIAGNOSIS — I7300 Raynaud's syndrome without gangrene: Secondary | ICD-10-CM | POA: Diagnosis not present

## 2016-08-30 NOTE — Progress Notes (Signed)
Daily Session Note  Patient Details  Name: Julie Page MRN: [id number] Date of Birth: 03-Dec-1951 Referring Provider:   [name] Pulmonary Rehab Walk Test from 06/09/2016 in [place]  Referring Provider  Dr. Chase Caller      Encounter Date: 08/30/2016  Check In:     Session Check In - 08/30/16 1336      Check-In   [facility]   Staff Present Rodney Langton, RN;[name] diVincenzo, MS, ACSM RCEP, Exercise Physiologist;Portia Rollene Rotunda, Therapist, sports, BSN;Ramon Dredge, RN, [facility]   Supervising physician immediately available to respond to emergencies [facility] immediately available   Physician(s) Dr. Marily Memos   Medication changes reported     No   Fall or balance concerns reported    No   Warm-up and Cool-down Performed as group-led instruction   Resistance Training Performed Yes   VAD Patient? No     Pain Assessment   Currently in Pain? No/denies   Multiple Pain Sites No      Capillary Blood Glucose: No results found for this or any previous visit (from the past 24 hour(s)).      Exercise Prescription Changes - 08/30/16 1500      Exercise Review   Progression Yes     Response to Exercise   Blood Pressure (Admit) 108/56   Blood Pressure (Exercise) 120/60   Blood Pressure (Exit) 100/50   Heart Rate (Admit) 90 bpm   Heart Rate (Exercise) 95 bpm   Heart Rate (Exit) 81 bpm   Oxygen Saturation (Admit) 97 %   Oxygen Saturation (Exercise) 90 %   Oxygen Saturation (Exit) 93 %   Rating of Perceived Exertion (Exercise) 17   Perceived Dyspnea (Exercise) 3   Duration Progress to 45 minutes of aerobic exercise without signs/symptoms of physical distress   Intensity THRR unchanged     Progression   Progression Continue to progress workloads to maintain intensity without signs/symptoms of physical distress.     Resistance Training   Training Prescription Yes   Weight orange bands   Reps 10-12  10 minutes of  strength training     Interval Training   Interval Training No     NuStep   Level 4   Minutes 17   METs 2.3     Arm Ergometer   Level 2   Minutes 17     Track   Laps 11   Minutes 17     Goals Met:  Exercise tolerated well No report of cardiac concerns or symptoms Strength training completed today  Goals Unmet:  Not Applicable Comments: Service time is from 1330 to 1500       Dr. Rush Farmer is Medical Director for Pulmonary Rehab at [facility].

## 2016-08-31 ENCOUNTER — Telehealth (HOSPITAL_COMMUNITY): Payer: Self-pay

## 2016-08-31 ENCOUNTER — Ambulatory Visit (INDEPENDENT_AMBULATORY_CARE_PROVIDER_SITE_OTHER): Payer: Medicare Other | Admitting: Internal Medicine

## 2016-08-31 ENCOUNTER — Encounter: Payer: Self-pay | Admitting: Internal Medicine

## 2016-08-31 ENCOUNTER — Encounter (INDEPENDENT_AMBULATORY_CARE_PROVIDER_SITE_OTHER): Payer: Self-pay

## 2016-08-31 VITALS — BP 112/68 | HR 93 | Ht 60.0 in | Wt 122.0 lb

## 2016-08-31 DIAGNOSIS — J9611 Chronic respiratory failure with hypoxia: Secondary | ICD-10-CM

## 2016-08-31 DIAGNOSIS — J84112 Idiopathic pulmonary fibrosis: Secondary | ICD-10-CM

## 2016-08-31 DIAGNOSIS — R05 Cough: Secondary | ICD-10-CM | POA: Diagnosis not present

## 2016-08-31 DIAGNOSIS — R059 Cough, unspecified: Secondary | ICD-10-CM

## 2016-08-31 NOTE — Patient Instructions (Addendum)
ICD-9-CM ICD-10-CM   1. IPF (idiopathic pulmonary fibrosis) (HCC) 516.31 J84.112   2. Chronic hypoxemic respiratory failure (HCC) 518.83 J96.11    799.02    3. Cough 786.2 R05    IPF stable Respect your desire to avoid anti-fibrotic therapy Cough likely due to IPF and difficutl to treat without side effects   - best treatment is your codeine cough syrup as needed but  agreedYou will weigh the side effect profile before you take it Respect your decision to reject flu shot Continue oxygen and pulmonary rehabilitation We will await the results of the right heart catheterization from 09/12/2016 Appreciate the fact that you agreed to join the IPF registry study protocol  -We went over the study details  - Please meet with Carron CurieJennifer Castillo research assistant [facility] 08/31/2016   Followup 2 months or sooner if needed  = Walk test room air on follow-up   - spirometry at followup

## 2016-08-31 NOTE — Progress Notes (Signed)
Subjective:     Patient ID: Julie Page, female   DOB: December 19, 1951, 65 y.o.   MRN: [id number]  HPI   OV 04/28/2016  Chief Complaint  Patient presents with  . Follow-up    Pt here for HFU for pna and ILD. Pt states she is still occassionally having DOE, prod cough with clear mucus. Pt denies CP/tightness.    65 year old female presents with [place] on [facility] for evaluation of interstitial lung disease. History is gained from talking to her, her son review of the outside chart and old chart.  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 This building has mold in it and on and off ever since she started working that she started having respirations symptoms. In 2012 and 2013 she was evaluated by Julie Page in our institution. 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 There is also history of [name] and rheumatoid factor antibody. At that time pulmonary function test showed isolated low reduction in diffusion capacity that was mild. She only had dyspnea on climbing stairs at that point in time. She continued to be in stable health up until spring of 2017. During this time she continued to work in the building. In the interim she did follow-up with Julie Page at [facility] rheumatology. Pertinent lab tests are listed below. No systemic evidence of autoimmune disease was discovered and she was on serial follow-up.  Then most recently in April 2017 she had 2 weeks of respiratory exacerbation and then hospitalized for 1 week. CT scan of the chest at this point in time shows classic UIP pattern associated with diffuse groundglass opacity consistent with UIP flare. Significant progression since previous CT scan in 2012. She was discharged on steroid taper which she still is taking and 1 L oxygen. She is extremely physically  deconditioned. She spent some time in inpatient rehabilitation. She is now at home and is going to start home physical therapy. She notices that at room air at rest she would be 92% can be off oxygen for a few hours when she exerts she can drop into the 80s.  Echocardiogram at this point in time shows normal ejection fraction but elevated right ventricular and pulmonary artery systolic pressure to 40 mmHg.  She and her son have many questions pertaining to etiology, prognosis, treatment and outlook and disability application.    Autoimmune labs - Rheumatoid factor s 24 and in April 2013 was 59 in our health system. But with Julie Page at [facility] was 28 and 102, in 2014 in 2015 respectively - [name] was positive at 1 in April 2017 in our health system and was trace positive at 2.0 with Julie Page in September 2014. - No evidence that she's had CCP antibody tested for hypersensitivity panel tested -In September 2014 with Julie Page was trace positive but negative [name] scleroderma 70 SSB and double-stranded DNA antibodies.. Similar result in April 2017.  Waking desat test 185 feet x 3 laps on RA: desat to 86% in 1/2 to 1 lap and stopped    OV 05/24/2016  Chief Complaint  Patient presents with  . Follow-up    pt has not yet had R heart cath, just heard from cards yesterday.  pt has no complaints today.      Follow-up interstitial lung disease provisional  diagnosis of IPF. Presents with her son Julie Page discuss pulmonary function tests and autoimmune results  Last visit sent  CCP antibody and hypersensitivity pneumonitis profile and these were negative. We did pulmonary function test. She could not do her DLCO but FVC is 54% and a moderate severity. Currently she feels physically stronger but still very deconditioned from her admission. She is able to time 1 flight of stairs 1 L oxygen and desaturates to 88% especially when she coughs. Cough is still a problem for which  she is on palliative support with Tessalon. She has now got a call from pulmonary rehabilitation and she plans to start that. She is working with cardiology to get an appointment to see Julie Page for right heart cath. She has not decided about anti-fibrotic therapy. She is very afraid of the side effects. She says she is very sensitive. She is worried about [name] because of sunscreen and she is worried about [name] because of potential cardiac risk. She feels that she wants to get stronger before she can try the medications. Although I cautioned her that IPF is unpredictable and can progress aggressively especially in the first after flare up. Her son Julie Page is in favor of taking her medications but she still not sure we discussed this extensively. She wants to consult with her other son   OV 08/31/2016  Chief Complaint  Patient presents with  . Follow-up    Pt states her breathing is doing well. Pt c/o prod cough with clear mucus. Pt denies CP/tightness and f/c/s.      Follow-up idiopathic pulmonary fibrosis diagnoses made June 2017  This is a routine follow-up. She is now on pulmonary rehabilitation. She uses 1 L of oxygen. Conditioning has improved. Shortness of breath has improved. But she is unable to go off oxygen. She continues to recheck anti-fibrotic therapy. In fact today she also rejected flu shot. In general she is averse to taking medications because of side effect profile. She will have her right heart catheterization on 09/12/2016. Her main symptomatic issue is cough that is occasional but very severe when it happens. She gets fatigue. This unpredictability to its onset. She has codeine cough syrup that helps but she is reluctant to take. Because of side effects. She's not interested in other options such as treatment of sinus drainage or vocal cord irritation or acid reflux therapy or over-the-counter Phenergan cough syrup    has a past medical history of Acute  respiratory failure with hypoxia (HCC); Anemia; CAP (community acquired pneumonia); Chronic diastolic heart failure (HCC); Hypercholesteremia; Hypoxia; ILD (interstitial lung disease) (HCC); Leukocytosis; Malnutrition of moderate degree (HCC); Palpitations; Protein calorie malnutrition (HCC); Pulmonary HTN (HCC); Raynaud's syndrome; and SIRS (systemic inflammatory response syndrome) (HCC).   reports that she has never smoked. She has never used smokeless tobacco.  Past Surgical History:  Procedure Laterality Date  . TUBAL LIGATION  03/30/1978    Allergies  Allergen Reactions  . Azithromycin Other (See Comments)    Burning and itching at IV infusion site  . Spiriva Handihaler [Tiotropium Bromide Monohydrate] Other (See Comments)    "Made me crazy"    Immunization History  Administered Date(s) Administered  . PPD Test 03/31/2016    Family History  Problem Relation Age of Onset  . Heart disease Mother   . Heart disease Maternal Grandfather   . Heart disease Father      Current Outpatient Prescriptions:  .  Ascorbic Acid (VITAMIN C) 100 MG tablet, Take 100  mg by mouth daily., Disp: , Rfl:  .  cholecalciferol (VITAMIN D) 400 units TABS tablet, Take 400 Units by mouth daily., Disp: , Rfl:  .  Digestive Enzymes (DIGESTIVE ENZYME PO), Take by mouth daily., Disp: , Rfl:  .  magnesium 30 MG tablet, Take 30 mg by mouth daily., Disp: , Rfl:  .  Omega-3 Fatty Acids (FISH OIL) 1000 MG CAPS, Take 1 capsule by mouth daily., Disp: , Rfl:  .  TURMERIC CURCUMIN PO, Take by mouth daily., Disp: , Rfl:    Review of Systems     Objective:   Physical Exam  Vitals:   08/31/16 1336  BP: 112/68  Pulse: 93  SpO2: 93%  Weight: 122 lb (55.3 kg)  Height: 5' (1.524 m)   Estimated body mass index is 23.83 kg/m as calculated from the following:   Height as of this encounter: 5' (1.524 m).   Weight as of this encounter: 122 lb (55.3 kg).  General exam: Frail female seated in the exam room.  Looks more conditioned but still deconditioned HEENT: Nasal oxygen on.  no elevated JVP no neck nodes Respiratory exam: Bilateral bibasal crackles are faint. No respiratory distress Cardiovascular: REGULAR RATE AND RHYTHM NORMAL HEART SOUNDS REGULAR RATE AND RHYTHM Abdomen: Soft nontender no organomegaly Extremity: No cyanosis no clubbing no edema Skin exam: Intact on exposed areas      Assessment:       ICD-9-CM ICD-10-CM   1. IPF (idiopathic pulmonary fibrosis) (HCC) 516.31 J84.112   2. Chronic hypoxemic respiratory failure (HCC) 518.83 J96.11    799.02    3. Cough 786.2 R05        Plan:      IPF stable Respect your desire to avoid anti-fibrotic therapy Cough likely due to IPF and difficutl to treat without side effects   - best treatment is your codeine cough syrup as needed but  agreedYou will weigh the side effect profile before you take it Respect your decision to reject flu shot Continue oxygen and pulmonary rehabilitation We will await the results of the right heart catheterization from 09/12/2016 Appreciate the fact that you agreed to join the IPF registry study protocol  -We went over the study details  - Please meet with Carron Curie research assistant [facility] 08/31/2016   Followup 3 months or sooner if needed  - Walk test room air on follow-up  - Spirometry at follow-up     Dr. Kalman Shan, M.D., [facility].C.P Pulmonary and Critical Care Medicine Staff Physician [facility] [place] [facility] Pager: [phone], If no answer or between  15:00h - 7:00h: call [phone]  08/31/2016 1:59 PM

## 2016-08-31 NOTE — Telephone Encounter (Signed)
[facility] ([facility]). Scheduled on 09/12/16 at 9:00 am, [name] made aware, instructions reviewed with patient (not on any current meds, NPO after midnight, have person to drive home after procedure).  Ave FilterBradley, Wirt Hemmerich Genevea, RN

## 2016-09-01 ENCOUNTER — Other Ambulatory Visit (HOSPITAL_COMMUNITY): Payer: Self-pay

## 2016-09-01 ENCOUNTER — Encounter (HOSPITAL_COMMUNITY)
Admission: RE | Admit: 2016-09-01 | Discharge: 2016-09-01 | Disposition: A | Discharge status: Home / Self Care | Payer: Medicare Other | Source: Ambulatory Visit | Attending: Internal Medicine | Admitting: Internal Medicine

## 2016-09-01 VITALS — Wt 124.6 lb

## 2016-09-01 DIAGNOSIS — R0600 Dyspnea, unspecified: Secondary | ICD-10-CM

## 2016-09-01 DIAGNOSIS — I7300 Raynaud's syndrome without gangrene: Secondary | ICD-10-CM | POA: Diagnosis not present

## 2016-09-01 DIAGNOSIS — Z79899 Other long term (current) drug therapy: Secondary | ICD-10-CM | POA: Diagnosis not present

## 2016-09-01 DIAGNOSIS — J849 Interstitial pulmonary disease, unspecified: Secondary | ICD-10-CM | POA: Diagnosis not present

## 2016-09-01 DIAGNOSIS — I5032 Chronic diastolic (congestive) heart failure: Secondary | ICD-10-CM | POA: Diagnosis not present

## 2016-09-01 DIAGNOSIS — E7800 Pure hypercholesterolemia, unspecified: Secondary | ICD-10-CM | POA: Diagnosis not present

## 2016-09-01 DIAGNOSIS — Z7982 Long term (current) use of aspirin: Secondary | ICD-10-CM | POA: Diagnosis not present

## 2016-09-01 NOTE — Progress Notes (Signed)
Daily Session Note  Patient Details  Name: Julie Page MRN: [id number] Date of Birth: [birth year] Referring Provider:   [name] Pulmonary Rehab Walk Test from 06/09/2016 in [place]  Referring Provider  Dr. Chase Caller      Encounter Date: 09/01/2016  Check In:     Session Check In - 09/01/16 1329      Check-In   Location [facility]   Staff Present Su Hilt, MS, ACSM RCEP, Exercise Physiologist;Joan Leonia Reeves, RN, BSN;Lisa Hughes, RN;Eureka Valdes Rollene Rotunda, RN, BSN   Supervising physician immediately available to respond to emergencies [facility] immediately available   Physician(s) Dr. Waldron Labs   Medication changes reported     No   Fall or balance concerns reported    No   Warm-up and Cool-down Performed as group-led Location manager Performed Yes   VAD Patient? No     Pain Assessment   Currently in Pain? No/denies   Multiple Pain Sites No      Capillary Blood Glucose: No results found for this or any previous visit (from the past 24 hour(s)).      Exercise Prescription Changes - 09/01/16 1523      Response to Exercise   Blood Pressure (Admit) 98/52   Blood Pressure (Exercise) 104/66   Blood Pressure (Exit) 92/50   Heart Rate (Admit) 81 bpm   Heart Rate (Exercise) 86 bpm   Heart Rate (Exit) 79 bpm   Oxygen Saturation (Admit) 99 %   Oxygen Saturation (Exercise) 94 %   Oxygen Saturation (Exit) 100 %   Rating of Perceived Exertion (Exercise) 16   Perceived Dyspnea (Exercise) 3   Duration Progress to 45 minutes of aerobic exercise without signs/symptoms of physical distress   Intensity THRR unchanged     Progression   Progression Continue to progress workloads to maintain intensity without signs/symptoms of physical distress.     Resistance Training   Training Prescription Yes   Weight orange bands   Reps 10-12  10 minutes of strength training     Interval Training   Interval  Training No     Oxygen   Oxygen Continuous   Liters 1     Arm Ergometer   Level 2   Minutes 17     Track   Laps 9   Minutes 17     Goals Met:  Independence with exercise equipment Improved SOB with ADL's Using PLB without cueing & demonstrates good technique Exercise tolerated well No report of cardiac concerns or symptoms Strength training completed today  Goals Unmet:  Not Applicable  Comments: Service time is from 1330 to 1500   Dr. Rush Farmer is Medical Director for Pulmonary Rehab at [facility].

## 2016-09-01 NOTE — Progress Notes (Signed)
Pulmonary Individual Treatment Plan  Patient Details  Name: ALAINA DONATI MRN: [id number] Date of Birth: 07-Apr-1951 Referring Provider:   Doristine Devoid Pulmonary Rehab Walk Test from 06/09/2016 in [facility] CARDIAC [facility]  Referring Provider  Dr. Marchelle Gearing      Initial Encounter Date:  Flowsheet Row Pulmonary Rehab Walk Test from 06/09/2016 in [facility] CARDIAC REHAB  Date  06/10/16  Referring Provider  Dr. Marchelle Gearing      Visit Diagnosis: Interstitial lung disease (HCC)  [name] Home Medications on Admission:   Current Outpatient Prescriptions:  .  Ascorbic Acid (VITAMIN C) 100 MG tablet, Take 100 mg by mouth daily., Disp: , Rfl:  .  cholecalciferol (VITAMIN D) 400 units TABS tablet, Take 400 Units by mouth daily., Disp: , Rfl:  .  Digestive Enzymes (DIGESTIVE ENZYME PO), Take by mouth daily., Disp: , Rfl:  .  magnesium 30 MG tablet, Take 30 mg by mouth daily., Disp: , Rfl:  .  Omega-3 Fatty Acids (FISH OIL) 1000 MG CAPS, Take 1 capsule by mouth daily., Disp: , Rfl:  .  TURMERIC CURCUMIN PO, Take by mouth daily., Disp: , Rfl:   Past Medical History: Past Medical History:  Diagnosis Date  . Acute respiratory failure with hypoxia (HCC)   . Anemia    Chronic disease  . CAP (community acquired pneumonia)   . Chronic diastolic heart failure (HCC)   . Hypercholesteremia   . Hypoxia   . ILD (interstitial lung disease) (HCC)   . Leukocytosis   . Malnutrition of moderate degree (HCC)   . Palpitations   . Protein calorie malnutrition (HCC)   . Pulmonary HTN (HCC)   . Raynaud's syndrome   . SIRS (systemic inflammatory response syndrome) (HCC)     Tobacco Use: History  Smoking Status  . Never Smoker  Smokeless Tobacco  . Never Used    Labs: Recent Review Flowsheet Data    There is no flowsheet data to display.      Capillary Blood Glucose: No results found for: GLUCAP   ADL UCSD:     Pulmonary Assessment Scores    Row  Name 06/10/16 [phone] 06/23/16 1344       ADL UCSD   ADL Phase  - Entry    SOB Score total  - 75      mMRC Score   mMRC Score 3  -       Pulmonary Function Assessment:     Pulmonary Function Assessment - 06/03/16 1620      Breath   Bilateral Breath Sounds Other  fine crackles on the lower bases, otherwise clear   Shortness of Breath Yes;Fear of Shortness of Breath;Limiting activity      Exercise Target Goals:    Exercise Program Goal: Individual exercise prescription set with THRR, safety & activity barriers. Participant demonstrates ability to understand and report RPE using BORG scale, to self-measure pulse accurately, and to acknowledge the importance of the exercise prescription.  Exercise Prescription Goal: Starting with aerobic activity 30 plus minutes a day, 3 days per week for initial exercise prescription. Provide home exercise prescription and guidelines that participant acknowledges understanding prior to discharge.  Activity Barriers & Risk Stratification:     Activity Barriers & Cardiac Risk Stratification - 06/03/16 1619      Activity Barriers & Cardiac Risk Stratification   Activity Barriers Deconditioning;Shortness of Breath;Assistive Device      6 Minute Walk:     6 Minute Walk  Row Name 06/10/16 0826         6 Minute Walk   Phase Initial     Distance 800 feet     Walk Time 6 minutes     # of Rest Breaks 0     MPH 1.5     METS 2.15     RPE 12     Perceived Dyspnea  1     Symptoms No     Resting HR 101 bpm     Resting BP 110/64     Max Ex. HR 117 bpm     Max Ex. BP 98/60       Interval HR   Baseline HR 101     1 Minute HR 109     2 Minute HR 115     3 Minute HR 117     4 Minute HR 109     5 Minute HR 108     6 Minute HR 107     2 Minute Post HR 107     Interval Heart Rate? Yes       Interval Oxygen   Baseline Oxygen Saturation % 98 %     Baseline Liters of Oxygen 1 L     1 Minute Oxygen Saturation % 98 %     1 Minute  Liters of Oxygen 1 L     2 Minute Oxygen Saturation % 93 %     2 Minute Liters of Oxygen 1 L     3 Minute Oxygen Saturation % 91 %     3 Minute Liters of Oxygen 1 L     4 Minute Oxygen Saturation % 93 %     4 Minute Liters of Oxygen 1 L     5 Minute Oxygen Saturation % 93 %     5 Minute Liters of Oxygen 1 L     6 Minute Oxygen Saturation % 94 %     6 Minute Liters of Oxygen 1 L     2 Minute Post Oxygen Saturation % 99 %     2 Minute Post Liters of Oxygen 1 L        Initial Exercise Prescription:     Initial Exercise Prescription - 06/10/16 0800      Date of Initial Exercise RX and Referring Provider   Date 06/10/16   Referring Provider Dr. Marchelle Gearing     Oxygen   Oxygen Continuous   Liters 1     Prescription Details   Frequency (times per week) 2   Duration Progress to 45 minutes of aerobic exercise without signs/symptoms of physical distress     Intensity   THRR 40-80% of Max Heartrate 62-124   Ratings of Perceived Exertion 11-13   Perceived Dyspnea 0-4     Progression   Progression Continue progressive overload as per policy without signs/symptoms or physical distress.     Resistance Training   Training Prescription Yes   Weight Orange bands   Reps 10-12      Perform Capillary Blood Glucose checks as needed.  Exercise Prescription Changes:     Exercise Prescription Changes    Row Name 06/16/16 1700 06/23/16 1600 06/28/16 1600 06/30/16 1500 07/05/16 1500     Exercise Review   Progression  -  -  - Yes  -     Response to Exercise   Blood Pressure (Admit) 120/60 120/70 98/54 132/70 96/52   Blood Pressure (Exercise) 112/70 140/76 126/70 130/70 120/66  Blood Pressure (Exit) 110/60 110/70 100/56 106/50 102/62   Heart Rate (Admit) 97 bpm 87 bpm 90 bpm 93 bpm 81 bpm   Heart Rate (Exercise) 96 bpm 89 bpm 93 bpm 86 bpm 88 bpm   Heart Rate (Exit) 89 bpm 93 bpm 80 bpm 84 bpm 78 bpm   Oxygen Saturation (Admit) 98 % 98 % 97 % 98 % 98 %   Oxygen Saturation  (Exercise) 96 % 94 % 94 % 91 % 93 %   Oxygen Saturation (Exit) 96 % 91 % 98 % 98 % 100 %   Rating of Perceived Exertion (Exercise) 14 15 15 13 13    Perceived Dyspnea (Exercise) 2 2 1 2 2    Duration Progress to 45 minutes of aerobic exercise without signs/symptoms of physical distress Progress to 45 minutes of aerobic exercise without signs/symptoms of physical distress Progress to 45 minutes of aerobic exercise without signs/symptoms of physical distress Progress to 45 minutes of aerobic exercise without signs/symptoms of physical distress Progress to 45 minutes of aerobic exercise without signs/symptoms of physical distress   Intensity THRR New THRR New THRR New THRR unchanged THRR unchanged     Progression   Progression  -  -  - Continue to progress workloads to maintain intensity without signs/symptoms of physical distress. Continue to progress workloads to maintain intensity without signs/symptoms of physical distress.     Resistance Training   Training Prescription Yes Yes Yes Yes Yes   Weight Orange bands Orange bands Orange bands orange bands orange bands   Reps 10-12 10-12 10-12 10-12 10-12  10 minutes of strength training     Interval Training   Interval Training No No No No No     Oxygen   Oxygen Continuous Continuous Continuous Continuous Continuous   Liters 1 1 1 1 1      NuStep   Level  -  - 1 2 2    Minutes  -  - 17 17 17    METs  -  - 1.7 1.6 1.7     Arm Ergometer   Level 1 1 1 1 1    Minutes 17 17 17 17 17      Track   Laps 4 7 8   - 9   Minutes 17 17 17   - 17   Row Name 07/07/16 1500 07/12/16 1543 07/14/16 1542 07/19/16 1600 07/21/16 1546     Exercise Review   Progression Yes  -  -  -  -     Response to Exercise   Blood Pressure (Admit) 104/50 110/56 95/64 90/44  104/80   Blood Pressure (Exercise) 110/60 126/60 98/60 110/60 118/62   Blood Pressure (Exit) 100/60 102/60 112/60 112/64 108/56   Heart Rate (Admit) 88 bpm 96 bpm 85 bpm 81 bpm 87 bpm   Heart Rate  (Exercise) 81 bpm 91 bpm 80 bpm 84 bpm 94 bpm   Heart Rate (Exit) 76 bpm 79 bpm 75 bpm 75 bpm 85 bpm   Oxygen Saturation (Admit) 95 % 97 % 98 % 98 % 98 %   Oxygen Saturation (Exercise) 94 % 96 % 92 % 96 % 95 %   Oxygen Saturation (Exit) 99 % 98 % 99 % 97 % 98 %   Rating of Perceived Exertion (Exercise) 14 15 15 17 15    Perceived Dyspnea (Exercise) 2 2 2 2 2    Duration Progress to 45 minutes of aerobic exercise without signs/symptoms of physical distress Progress to 45 minutes of aerobic exercise without signs/symptoms of  physical distress Progress to 45 minutes of aerobic exercise without signs/symptoms of physical distress Progress to 45 minutes of aerobic exercise without signs/symptoms of physical distress Progress to 45 minutes of aerobic exercise without signs/symptoms of physical distress   Intensity THRR unchanged THRR unchanged THRR unchanged THRR unchanged THRR unchanged     Progression   Progression Continue to progress workloads to maintain intensity without signs/symptoms of physical distress. Continue to progress workloads to maintain intensity without signs/symptoms of physical distress. Continue to progress workloads to maintain intensity without signs/symptoms of physical distress. Continue to progress workloads to maintain intensity without signs/symptoms of physical distress. Continue to progress workloads to maintain intensity without signs/symptoms of physical distress.     Resistance Training   Training Prescription Yes Yes Yes Yes Yes   Weight orange bands orange bands orange bands orange bands orange bands   Reps 10-12  10 minutes of strength training 10-12  10 minutes of strength training 10-12  10 minutes of strength training 10-12  10 minutes of strength training 10-12  10 minutes of strength training     Interval Training   Interval Training No No No No No     Oxygen   Oxygen Continuous Continuous Continuous Continuous Continuous   Liters 1 1 1 1 1      NuStep    Level 3 3  - 3 3   Minutes 17 17  - 17 17   METs 1.7 1.9  - 1.9 1.9     Arm Ergometer   Level  - 1 1 1 1    Minutes  - 17 17 17 17      Track   Laps 8 8 8 7   -   Minutes 17 17 17 17   -   Row Name 07/26/16 1500 07/28/16 1600 08/02/16 1500 08/04/16 1500 08/09/16 1500     Exercise Review   Progression  - Yes  - Yes  -     Response to Exercise   Blood Pressure (Admit) 102/60 120/66 98/60 112/60 92/60   Blood Pressure (Exercise) 130/64 110/70 140/70 122/62 120/58   Blood Pressure (Exit) 96/60 104/70 94/60 100/50 96/60   Heart Rate (Admit) 88 bpm 89 bpm 87 bpm 66 bpm 79 bpm   Heart Rate (Exercise) 103 bpm 83 bpm 111 bpm 107 bpm 96 bpm   Heart Rate (Exit) 83 bpm 86 bpm 77 bpm 88 bpm 84 bpm   Oxygen Saturation (Admit) 96 % 99 % 98 % 96 % 98 %   Oxygen Saturation (Exercise) 94 % 92 % 95 % 92 % 95 %   Oxygen Saturation (Exit) 97 % 98 % 99 % 98 % 100 %   Rating of Perceived Exertion (Exercise) 15 14 16 15 17    Perceived Dyspnea (Exercise) 2 1 3 2 3    Duration Progress to 45 minutes of aerobic exercise without signs/symptoms of physical distress Progress to 45 minutes of aerobic exercise without signs/symptoms of physical distress Progress to 45 minutes of aerobic exercise without signs/symptoms of physical distress Progress to 45 minutes of aerobic exercise without signs/symptoms of physical distress Progress to 45 minutes of aerobic exercise without signs/symptoms of physical distress   Intensity THRR unchanged THRR unchanged THRR unchanged THRR unchanged THRR unchanged     Progression   Progression Continue to progress workloads to maintain intensity without signs/symptoms of physical distress. Continue to progress workloads to maintain intensity without signs/symptoms of physical distress. Continue to progress workloads to maintain intensity without signs/symptoms of physical  distress. Continue to progress workloads to maintain intensity without signs/symptoms of physical distress. Continue  to progress workloads to maintain intensity without signs/symptoms of physical distress.     Resistance Training   Training Prescription Yes Yes Yes Yes Yes   Weight orange bands orange bands orange bands orange bands orange bands   Reps 10-12  10 minutes of strength training 10-12  10 mins of strength training 10-12  10 minutes of srength training 10-12  10 minutes of strength training 10-12  10 minutes of strength training     Interval Training   Interval Training No No No No No     Oxygen   Oxygen Continuous Continuous Continuous Continuous Continuous   Liters 1 1 1 1 1      NuStep   Level 3 4 3 4 4    Minutes 17 17 17 17 17    METs 1.9 2.3 1.9 2.4 2.2     Arm Ergometer   Level 1  - 1 1 1    Minutes 17  - 17 17 17      Track   Laps 10 11 10   - 11   Minutes 17 17 17   - 17     Home Exercise Plan   Plans to continue exercise at  -  - Home  -  -   Frequency  -  - Add 3 additional days to program exercise sessions.  -  -   Row Name 08/11/16 1600 08/16/16 1500 08/18/16 1500 08/23/16 1500 08/30/16 1500     Exercise Review   Progression  -  -  -  - Yes     Response to Exercise   Blood Pressure (Admit) 106/64 106/66 104/60 118/62 108/56   Blood Pressure (Exercise) 128/60 120/64 132/92 110/50 120/60   Blood Pressure (Exit) 110/72 64/46 112/60 112/62 100/50   Heart Rate (Admit) 83 bpm 82 bpm 82 bpm 84 bpm 90 bpm   Heart Rate (Exercise) 87 bpm 92 bpm 88 bpm 87 bpm 95 bpm   Heart Rate (Exit) 79 bpm 77 bpm 82 bpm 78 bpm 81 bpm   Oxygen Saturation (Admit) 96 % 100 % 100 % 99 % 97 %   Oxygen Saturation (Exercise) 98 % 92 % 96 % 96 % 90 %   Oxygen Saturation (Exit) 99 % 99 % 98 % 98 % 93 %   Rating of Perceived Exertion (Exercise) 16 16 16 16 17    Perceived Dyspnea (Exercise) 3 3 3 3 3    Duration Progress to 45 minutes of aerobic exercise without signs/symptoms of physical distress Progress to 45 minutes of aerobic exercise without signs/symptoms of physical distress Progress to 45  minutes of aerobic exercise without signs/symptoms of physical distress Progress to 45 minutes of aerobic exercise without signs/symptoms of physical distress Progress to 45 minutes of aerobic exercise without signs/symptoms of physical distress   Intensity THRR unchanged THRR unchanged THRR unchanged THRR unchanged THRR unchanged     Progression   Progression Continue to progress workloads to maintain intensity without signs/symptoms of physical distress. Continue to progress workloads to maintain intensity without signs/symptoms of physical distress. Continue to progress workloads to maintain intensity without signs/symptoms of physical distress. Continue to progress workloads to maintain intensity without signs/symptoms of physical distress. Continue to progress workloads to maintain intensity without signs/symptoms of physical distress.     Resistance Training   Training Prescription Yes Yes Yes Yes Yes   Weight orange bands orange bands orange bands orange bands orange bands  Reps 10-12  10 minutes of strength training 10-12  10 minutes of strength training 10-12  10 minutes of strength training 10-12  10 minutes of strength training 10-12  10 minutes of strength training     Interval Training   Interval Training No No No No No     Oxygen   Oxygen Continuous Continuous Continuous Continuous  -   Liters 1 1 1 1   -     NuStep   Level 4 4  - 4 4   Minutes 17 17  - 17 17   METs 2.7 2.3  - 2.1 2.3     Arm Ergometer   Level 1 1 1 1 2    Minutes 17 17 17 17 17      Track   Laps  - 8 11 9 11    Minutes  - 17 17 17 17       Exercise Comments:     Exercise Comments    Row Name 06/30/16 0834 07/28/16 0820 08/02/16 1552 08/29/16 1201     Exercise Comments Has attended 3 exercise sessions, too early to see progress, tolerating exercise well. Patient is slightly resistant to increasing intensity. Will continue to monitor and progress when appropriate. Completed home exercise program  Patient has had a few workload increases. Slow to progress. Reluctant to workload increases. Will cont. to motivate.       Discharge Exercise Prescription (Final Exercise Prescription Changes):     Exercise Prescription Changes - 08/30/16 1500      Exercise Review   Progression Yes     Response to Exercise   Blood Pressure (Admit) 108/56   Blood Pressure (Exercise) 120/60   Blood Pressure (Exit) 100/50   Heart Rate (Admit) 90 bpm   Heart Rate (Exercise) 95 bpm   Heart Rate (Exit) 81 bpm   Oxygen Saturation (Admit) 97 %   Oxygen Saturation (Exercise) 90 %   Oxygen Saturation (Exit) 93 %   Rating of Perceived Exertion (Exercise) 17   Perceived Dyspnea (Exercise) 3   Duration Progress to 45 minutes of aerobic exercise without signs/symptoms of physical distress   Intensity THRR unchanged     Progression   Progression Continue to progress workloads to maintain intensity without signs/symptoms of physical distress.     Resistance Training   Training Prescription Yes   Weight orange bands   Reps 10-12  10 minutes of strength training     Interval Training   Interval Training No     NuStep   Level 4   Minutes 17   METs 2.3     Arm Ergometer   Level 2   Minutes 17     Track   Laps 11   Minutes 17       Nutrition:  Target Goals: Understanding of nutrition guidelines, daily intake of sodium 1500mg , cholesterol 200mg , calories 30% from fat and 7% or less from saturated fats, daily to have 5 or more servings of fruits and vegetables.  Biometrics:     Pre Biometrics - 06/10/16 0833      Pre Biometrics   Grip Strength 22 kg       Nutrition Therapy Plan and Nutrition Goals:     Nutrition Therapy & Goals - 07/19/16 1534      Nutrition Therapy   Diet Low Sodium     Personal Nutrition Goals   Personal Goal #1 Prevent further weight loss/maintain current weight while in Pulmonary Rehab     Intervention Plan  Intervention Prescribe, educate and counsel  regarding individualized specific dietary modifications aiming towards targeted core components such as weight, hypertension, lipid management, diabetes, heart failure and other comorbidities.   Expected Outcomes Short Term Goal: Understand basic principles of dietary content, such as calories, fat, sodium, cholesterol and nutrients.;Long Term Goal: Adherence to prescribed nutrition plan.      Nutrition Discharge: Rate Your Plate Scores:     Nutrition Assessments - 07/18/16 1541      Rate Your Plate Scores   Pre Score 57      Psychosocial: Target Goals: Acknowledge presence or absence of depression, maximize coping skills, provide positive support system. Participant is able to verbalize types and ability to use techniques and skills needed for reducing stress and depression.  Initial Review & Psychosocial Screening:     Initial Psych Review & Screening - 06/03/16 1623      Family Dynamics   Good Support System? Yes     Screening Interventions   Interventions Encouraged to exercise      Quality of Life Scores:     Quality of Life - 06/23/16 1344      Quality of Life Scores   Health/Function Pre 7.39 %   Socioeconomic Pre 11.56 %   Psych/Spiritual Pre 6.14 %   Family Pre 26 %   GLOBAL Pre 9.91 %      PHQ-9: Recent Review Flowsheet Data    Depression screen [facility] 2/9 06/03/2016 03/13/2016   Decreased Interest 1 0   Down, Depressed, Hopeless 1 0   PHQ - 2 Score 2 0   Altered sleeping 0 -   Tired, decreased energy 1 -   Change in appetite 0 -   Feeling bad or failure about yourself  1 -   Trouble concentrating 0 -   Moving slowly or fidgety/restless 0 -   Suicidal thoughts 0 -   PHQ-9 Score 4 -   Difficult doing work/chores Not difficult at all -      Psychosocial Evaluation and Intervention:   Psychosocial Re-Evaluation:     Psychosocial Re-Evaluation    Row Name 06/27/16 1141 07/25/16 1548 08/25/16 0839         Psychosocial Re-Evaluation    Interventions Encouraged to attend Pulmonary Rehabilitation for the exercise  - Encouraged to attend Pulmonary Rehabilitation for the exercise     Comments No psychosocial concerns identified at this time. -  no concerns identified Good support system     Continued Psychosocial Services Needed No No No       Education: Education Goals: Education classes will be provided on a weekly basis, covering required topics. Participant will state understanding/return demonstration of topics presented.  Learning Barriers/Preferences:     Learning Barriers/Preferences - 06/03/16 1620      Learning Barriers/Preferences   Learning Barriers None   Learning Preferences Group Instruction;Individual Instruction;Written Material      Education Topics: Risk Factor Reduction:  -Group instruction that is supported by a PowerPoint presentation. Instructor discusses the definition of a risk factor, different risk factors for pulmonary disease, and how the heart and lungs work together.     Nutrition for Pulmonary Patient:  -Group instruction provided by PowerPoint slides, verbal discussion, and written materials to support subject matter. The instructor gives an explanation and review of healthy diet recommendations, which includes a discussion on weight management, recommendations for fruit and vegetable consumption, as well as protein, fluid, caffeine, fiber, sodium, sugar, and alcohol. Tips for eating when patients are short  of breath are discussed. Flowsheet Row PULMONARY REHAB OTHER RESPIRATORY from 08/18/2016 in [facility] CARDIAC REHAB  Date  07/28/16  Educator  RD  Instruction Review Code  2- meets goals/outcomes      Pursed Lip Breathing:  -Group instruction that is supported by demonstration and informational handouts. Instructor discusses the benefits of pursed lip and diaphragmatic breathing and detailed demonstration on how to preform both.   Flowsheet Row PULMONARY REHAB  OTHER RESPIRATORY from 08/18/2016 in [facility] CARDIAC REHAB  Date  07/14/16  Educator  EP  Instruction Review Code  2- meets goals/outcomes      Oxygen Safety:  -Group instruction provided by PowerPoint, verbal discussion, and written material to support subject matter. There is an overview of "What is Oxygen" and "Why do we need it".  Instructor also reviews how to create a safe environment for oxygen use, the importance of using oxygen as prescribed, and the risks of noncompliance. There is a brief discussion on traveling with oxygen and resources the patient may utilize. Flowsheet Row PULMONARY REHAB OTHER RESPIRATORY from 08/18/2016 in [facility] CARDIAC REHAB  Date  08/11/16  Educator  rn  Instruction Review Code  2- meets goals/outcomes      Oxygen Equipment:  -Group instruction provided by [employer] Staff utilizing handouts, written materials, and equipment demonstrations. Flowsheet Row PULMONARY REHAB OTHER RESPIRATORY from 08/18/2016 in [facility] CARDIAC REHAB  Date  06/16/16  Educator  Patsy Lager rep  Instruction Review Code  2- meets goals/outcomes      Signs and Symptoms:  -Group instruction provided by written material and verbal discussion to support subject matter. Warning signs and symptoms of infection, stroke, and heart attack are reviewed and when to call the physician/911 reinforced. Tips for preventing the spread of infection discussed. Flowsheet Row PULMONARY REHAB OTHER RESPIRATORY from 08/18/2016 in [facility] CARDIAC REHAB  Date  06/30/16  Educator  RN  Instruction Review Code  2- meets goals/outcomes      Advanced Directives:  -Group instruction provided by verbal instruction and written material to support subject matter. Instructor reviews Advanced Directive laws and proper instruction for filling out document.   Pulmonary Video:  -Group video education that reviews the importance  of medication and oxygen compliance, exercise, good nutrition, pulmonary hygiene, and pursed lip and diaphragmatic breathing for the pulmonary patient. Flowsheet Row PULMONARY REHAB OTHER RESPIRATORY from 08/18/2016 in [facility] CARDIAC REHAB  Date  08/18/16  Instruction Review Code  2- meets goals/outcomes      Exercise for the Pulmonary Patient:  -Group instruction that is supported by a PowerPoint presentation. Instructor discusses benefits of exercise, core components of exercise, frequency, duration, and intensity of an exercise routine, importance of utilizing pulse oximetry during exercise, safety while exercising, and options of places to exercise outside of rehab.   Flowsheet Row PULMONARY REHAB OTHER RESPIRATORY from 08/18/2016 in [facility] CARDIAC REHAB  Date  06/23/16  Educator  EP  Instruction Review Code  2- meets goals/outcomes      Pulmonary Medications:  -Verbally interactive group education provided by instructor with focus on inhaled medications and proper administration.   Anatomy and Physiology of the Respiratory System and Intimacy:  -Group instruction provided by PowerPoint, verbal discussion, and written material to support subject matter. Instructor reviews respiratory cycle and anatomical components of the respiratory system and their functions. Instructor also reviews differences in obstructive and restrictive  respiratory diseases with examples of each. Intimacy, Sex, and Sexuality differences are reviewed with a discussion on how relationships can change when diagnosed with pulmonary disease. Common sexual concerns are reviewed. Flowsheet Row PULMONARY REHAB OTHER RESPIRATORY from 08/18/2016 in [facility] CARDIAC REHAB  Date  07/07/16  Educator  RN  Instruction Review Code  2- meets goals/outcomes      Knowledge Questionnaire Score:     Knowledge Questionnaire Score - 06/23/16 1343      Knowledge  Questionnaire Score   Pre Score 12/13      Core Components/Risk Factors/Patient Goals at Admission:     Personal Goals and Risk Factors at Admission - 06/03/16 1621      Core Components/Risk Factors/Patient Goals on Admission   Increase Strength and Stamina Yes   Intervention Provide advice, education, support and counseling about physical activity/exercise needs.;Develop an individualized exercise prescription for aerobic and resistive training based on initial evaluation findings, risk stratification, comorbidities and participant's personal goals.   Expected Outcomes Achievement of increased cardiorespiratory fitness and enhanced flexibility, muscular endurance and strength shown through measurements of functional capacity and personal statement of participant.   Improve shortness of breath with ADL's Yes   Intervention Provide education, individualized exercise plan and daily activity instruction to help decrease symptoms of SOB with activities of daily living.   Expected Outcomes Short Term: Achieves a reduction of symptoms when performing activities of daily living.   Develop more efficient breathing techniques such as purse lipped breathing and diaphragmatic breathing; and practicing self-pacing with activity Yes   Intervention Provide education, demonstration and support about specific breathing techniuqes utilized for more efficient breathing. Include techniques such as pursed lipped breathing, diaphragmatic breathing and self-pacing activity.   Expected Outcomes Short Term: Participant will be able to demonstrate and use breathing techniques as needed throughout daily activities.   Increase knowledge of respiratory medications and ability to use respiratory devices properly  Yes   Intervention Provide education and demonstration as needed of appropriate use of medications, inhalers, and oxygen therapy.   Expected Outcomes Short Term: Achieves understanding of medications use.  Understands that oxygen is a medication prescribed by physician. Demonstrates appropriate use of inhaler and oxygen therapy.      Core Components/Risk Factors/Patient Goals Review:      Goals and Risk Factor Review    Row Name 06/27/16 1136 07/25/16 1545 08/25/16 0836         Core Components/Risk Factors/Patient Goals Review   Personal Goals Review Increase Strength and Stamina;Improve shortness of breath with ADL's;Develop more efficient breathing techniques such as purse lipped breathing and diaphragmatic breathing and practicing self-pacing with activity.  - Increase Strength and Stamina;Improve shortness of breath with ADL's;Develop more efficient breathing techniques such as purse lipped breathing and diaphragmatic breathing and practicing self-pacing with activity.     Review Has only attended less than 5 exercise sessions, it is too early to see improvement in strength and stamina has attended 10 exercise sessions, slowly increasing her workloads, still no improvement with in strength and stamina.  She is very weak has has many "bad" days where she is very tired Has attended 19 exercise sessions, not progressing, very dificult to progress due to weakness and not feeling well     Expected Outcomes She should should see a noticeable improvement in the next 30 days. Hopefully will see an improvement in stamina in next 30 days. Has made very slow and minimal progress, resistance to increas workloads due to  feeling weak        Core Components/Risk Factors/Patient Goals at Discharge (Final Review):      Goals and Risk Factor Review - 08/25/16 0836      Core Components/Risk Factors/Patient Goals Review   Personal Goals Review Increase Strength and Stamina;Improve shortness of breath with ADL's;Develop more efficient breathing techniques such as purse lipped breathing and diaphragmatic breathing and practicing self-pacing with activity.   Review Has attended 19 exercise sessions, not  progressing, very dificult to progress due to weakness and not feeling well   Expected Outcomes Has made very slow and minimal progress, resistance to increas workloads due to feeling weak      ITP Comments:   Comments:ITP REVIEW Pt is making expected progress toward pulmonary rehab goals after completing 20 sessions. Recommend continued exercise, life style modification, education, and utilization of breathing techniques to increase stamina and strength and decrease shortness of breath with exertion.

## 2016-09-06 ENCOUNTER — Encounter (HOSPITAL_COMMUNITY)
Admission: RE | Admit: 2016-09-06 | Discharge: 2016-09-06 | Disposition: A | Discharge status: Home / Self Care | Payer: Medicare Other | Source: Ambulatory Visit | Attending: Internal Medicine | Admitting: Internal Medicine

## 2016-09-06 VITALS — Wt 124.8 lb

## 2016-09-06 DIAGNOSIS — E7800 Pure hypercholesterolemia, unspecified: Secondary | ICD-10-CM | POA: Diagnosis not present

## 2016-09-06 DIAGNOSIS — Z7982 Long term (current) use of aspirin: Secondary | ICD-10-CM | POA: Diagnosis not present

## 2016-09-06 DIAGNOSIS — J849 Interstitial pulmonary disease, unspecified: Secondary | ICD-10-CM | POA: Diagnosis not present

## 2016-09-06 DIAGNOSIS — Z79899 Other long term (current) drug therapy: Secondary | ICD-10-CM | POA: Diagnosis not present

## 2016-09-06 DIAGNOSIS — I7300 Raynaud's syndrome without gangrene: Secondary | ICD-10-CM | POA: Diagnosis not present

## 2016-09-06 DIAGNOSIS — I5032 Chronic diastolic (congestive) heart failure: Secondary | ICD-10-CM | POA: Diagnosis not present

## 2016-09-06 NOTE — Progress Notes (Signed)
Daily Session Note  Patient Details  Name: Julie Page MRN: [id number] Date of Birth: 1951-11-19 Referring Provider:   [name] Pulmonary Rehab Walk Test from 06/09/2016 in [place]  Referring Provider  Dr. Chase Caller      Encounter Date: 09/06/2016  Check In:     Session Check In - 09/06/16 1346      Check-In   Location [facility]   Staff Present Rosebud Poles, RN, BSN;[name] diVincenzo, MS, ACSM RCEP, Exercise Physiologist;Lisa Ysidro Evert, RN;Franz Svec Rollene Rotunda, RN, BSN   Supervising physician immediately available to respond to emergencies [facility] immediately available   Physician(s) Dr. Denny Levy   Medication changes reported     No   Fall or balance concerns reported    No   Warm-up and Cool-down Performed as group-led instruction   Resistance Training Performed Yes   VAD Patient? No     Pain Assessment   Currently in Pain? No/denies   Multiple Pain Sites No      Capillary Blood Glucose: No results found for this or any previous visit (from the past 24 hour(s)).      Exercise Prescription Changes - 09/06/16 1517      Response to Exercise   Blood Pressure (Admit) 116/70   Blood Pressure (Exercise) 120/60   Blood Pressure (Exit) 104/60   Heart Rate (Admit) 87 bpm   Heart Rate (Exercise) 98 bpm   Heart Rate (Exit) 78 bpm   Oxygen Saturation (Admit) 99 %   Oxygen Saturation (Exercise) 88 %   Oxygen Saturation (Exit) 100 %   Rating of Perceived Exertion (Exercise) 16   Perceived Dyspnea (Exercise) 3   Duration Progress to 45 minutes of aerobic exercise without signs/symptoms of physical distress   Intensity THRR unchanged     Progression   Progression Continue to progress workloads to maintain intensity without signs/symptoms of physical distress.     Resistance Training   Training Prescription Yes   Weight orange bands   Reps 10-12  10 minutes of strength training     Interval Training   Interval Training No     Oxygen   Oxygen Continuous   Liters 1     NuStep   Level 4   Minutes 17   METs 2.3     Arm Ergometer   Level 2   Minutes 17     Track   Laps 9   Minutes 17     Goals Met:  Improved SOB with ADL's Using PLB without cueing & demonstrates good technique Exercise tolerated well No report of cardiac concerns or symptoms Strength training completed today  Goals Unmet:  Not Applicable  Comments: Service time is from 1330 to 1500   Dr. Rush Farmer is Medical Director for Pulmonary Rehab at [facility].

## 2016-09-08 ENCOUNTER — Encounter (HOSPITAL_COMMUNITY)
Admission: RE | Admit: 2016-09-08 | Discharge: 2016-09-08 | Disposition: A | Discharge status: Home / Self Care | Payer: Medicare Other | Source: Ambulatory Visit | Attending: Internal Medicine | Admitting: Internal Medicine

## 2016-09-08 VITALS — Wt 123.2 lb

## 2016-09-08 DIAGNOSIS — I5032 Chronic diastolic (congestive) heart failure: Secondary | ICD-10-CM | POA: Diagnosis not present

## 2016-09-08 DIAGNOSIS — Z7982 Long term (current) use of aspirin: Secondary | ICD-10-CM | POA: Diagnosis not present

## 2016-09-08 DIAGNOSIS — J849 Interstitial pulmonary disease, unspecified: Secondary | ICD-10-CM

## 2016-09-08 DIAGNOSIS — E7800 Pure hypercholesterolemia, unspecified: Secondary | ICD-10-CM | POA: Diagnosis not present

## 2016-09-08 DIAGNOSIS — Z79899 Other long term (current) drug therapy: Secondary | ICD-10-CM | POA: Diagnosis not present

## 2016-09-08 DIAGNOSIS — I7300 Raynaud's syndrome without gangrene: Secondary | ICD-10-CM | POA: Diagnosis not present

## 2016-09-08 NOTE — Progress Notes (Signed)
Daily Session Note  Patient Details  Name: Julie Page MRN: [id number] Date of Birth: 1951/08/13 Referring Provider:   [name] Pulmonary Rehab Walk Test from 06/09/2016 in [place]  Referring Provider  Dr. Chase Caller      Encounter Date: 09/08/2016  Check In:     Session Check In - 09/08/16 1330      Check-In   Location [facility]   Staff Present Rosebud Poles, RN, BSN;[name] diVincenzo, MS, ACSM RCEP, Exercise Physiologist;Patria Warzecha Ysidro Evert, RN;Portia Rollene Rotunda, Therapist, sports, BSN;Ramon Dredge, RN, [facility]   Supervising physician immediately available to respond to emergencies [facility] immediately available   Physician(s) Dr. Dyann Kief   Medication changes reported     No   Fall or balance concerns reported    No   Warm-up and Cool-down Performed as group-led instruction   Resistance Training Performed Yes   VAD Patient? No     Pain Assessment   Currently in Pain? No/denies   Multiple Pain Sites No      Capillary Blood Glucose: No results found for this or any previous visit (from the past 24 hour(s)).      Exercise Prescription Changes - 09/08/16 1500      Response to Exercise   Blood Pressure (Admit) 100/50   Blood Pressure (Exercise) 128/60   Blood Pressure (Exit) 102/72   Heart Rate (Admit) 80 bpm   Heart Rate (Exercise) 112 bpm   Heart Rate (Exit) 83 bpm   Oxygen Saturation (Admit) 99 %   Oxygen Saturation (Exercise) 90 %   Oxygen Saturation (Exit) 98 %   Rating of Perceived Exertion (Exercise) 14   Perceived Dyspnea (Exercise) 2   Duration Progress to 45 minutes of aerobic exercise without signs/symptoms of physical distress   Intensity THRR unchanged     Progression   Progression Continue to progress workloads to maintain intensity without signs/symptoms of physical distress.     Resistance Training   Training Prescription Yes   Weight orange bands   Reps 10-12  10 minutes of strength training     Interval Training   Interval Training No     Oxygen   Oxygen Continuous   Liters 1     Rower   Level 1   Minutes 17     Track   Laps 11   Minutes 17     Goals Met:  Exercise tolerated well No report of cardiac concerns or symptoms Strength training completed today  Goals Unmet:  Not Applicable  Comments: Service time is from 1330 to 1515    Dr. Rush Farmer is Medical Director for Pulmonary Rehab at [facility].

## 2016-09-12 ENCOUNTER — Ambulatory Visit (HOSPITAL_COMMUNITY)
Admission: RE | Admit: 2016-09-12 | Discharge: 2016-09-12 | Disposition: A | Discharge status: Home / Self Care | Payer: Medicare Other | Source: Ambulatory Visit | Attending: Cardiology | Admitting: Cardiology

## 2016-09-12 ENCOUNTER — Encounter (HOSPITAL_COMMUNITY): Payer: Self-pay | Admitting: *Deleted

## 2016-09-12 ENCOUNTER — Encounter (HOSPITAL_COMMUNITY)
Admission: RE | Disposition: A | Discharge status: Home / Self Care | Payer: Self-pay | Source: Ambulatory Visit | Attending: Cardiology

## 2016-09-12 DIAGNOSIS — E7800 Pure hypercholesterolemia, unspecified: Secondary | ICD-10-CM | POA: Insufficient documentation

## 2016-09-12 DIAGNOSIS — I270 Primary pulmonary hypertension: Secondary | ICD-10-CM | POA: Diagnosis not present

## 2016-09-12 DIAGNOSIS — I5032 Chronic diastolic (congestive) heart failure: Secondary | ICD-10-CM | POA: Insufficient documentation

## 2016-09-12 DIAGNOSIS — Z8249 Family history of ischemic heart disease and other diseases of the circulatory system: Secondary | ICD-10-CM | POA: Insufficient documentation

## 2016-09-12 DIAGNOSIS — J849 Interstitial pulmonary disease, unspecified: Secondary | ICD-10-CM | POA: Diagnosis not present

## 2016-09-12 DIAGNOSIS — J84112 Idiopathic pulmonary fibrosis: Secondary | ICD-10-CM | POA: Diagnosis not present

## 2016-09-12 DIAGNOSIS — J9610 Chronic respiratory failure, unspecified whether with hypoxia or hypercapnia: Secondary | ICD-10-CM | POA: Insufficient documentation

## 2016-09-12 DIAGNOSIS — I7300 Raynaud's syndrome without gangrene: Secondary | ICD-10-CM | POA: Insufficient documentation

## 2016-09-12 DIAGNOSIS — R0600 Dyspnea, unspecified: Secondary | ICD-10-CM

## 2016-09-12 HISTORY — PX: CARDIAC CATHETERIZATION: SHX172

## 2016-09-12 LAB — POCT I-STAT 3, VENOUS BLOOD GAS (G3P V)
Acid-Base Excess: 2 mmol/L (ref 0.0–2.0)
Acid-Base Excess: 4 mmol/L — ABNORMAL HIGH (ref 0.0–2.0)
BICARBONATE: 29.9 mmol/L — AB (ref 20.0–28.0)
Bicarbonate: 27.7 mmol/L (ref 20.0–28.0)
O2 SAT: 78 %
O2 Saturation: 79 %
PCO2 VEN: 47.7 mmHg (ref 44.0–60.0)
PH VEN: 7.373 (ref 7.250–7.430)
PH VEN: 7.379 (ref 7.250–7.430)
PO2 VEN: 44 mmHg (ref 32.0–45.0)
TCO2: 29 mmol/L (ref 0–100)
TCO2: 31 mmol/L (ref 0–100)
pCO2, Ven: 50.7 mmHg (ref 44.0–60.0)
pO2, Ven: 45 mmHg (ref 32.0–45.0)

## 2016-09-12 LAB — CBC
HCT: 37.6 % (ref 36.0–46.0)
Hemoglobin: 12.3 g/dL (ref 12.0–15.0)
MCH: 29.4 pg (ref 26.0–34.0)
MCHC: 32.7 g/dL (ref 30.0–36.0)
MCV: 90 fL (ref 78.0–100.0)
PLATELETS: 261 10*3/uL (ref 150–400)
RBC: 4.18 MIL/uL (ref 3.87–5.11)
RDW: 14.2 % (ref 11.5–15.5)
WBC: 7.7 10*3/uL (ref 4.0–10.5)

## 2016-09-12 LAB — BASIC METABOLIC PANEL
Anion gap: 8 (ref 5–15)
BUN: 9 mg/dL (ref 6–20)
CALCIUM: 9.5 mg/dL (ref 8.9–10.3)
CO2: 29 mmol/L (ref 22–32)
CREATININE: 0.6 mg/dL (ref 0.44–1.00)
Chloride: 101 mmol/L (ref 101–111)
Glucose, Bld: 96 mg/dL (ref 65–99)
Potassium: 4 mmol/L (ref 3.5–5.1)
SODIUM: 138 mmol/L (ref 135–145)

## 2016-09-12 LAB — PROTIME-INR
INR: 0.99
PROTHROMBIN TIME: 13.1 s (ref 11.4–15.2)

## 2016-09-12 SURGERY — RIGHT HEART CATH
Anesthesia: LOCAL

## 2016-09-12 MED ORDER — ASPIRIN 81 MG PO CHEW
CHEWABLE_TABLET | ORAL | Status: AC
  Administered 2016-09-12: 81 mg via ORAL
  Filled 2016-09-12: qty 1

## 2016-09-12 MED ORDER — SODIUM CHLORIDE 0.9% FLUSH
3.0000 mL | INTRAVENOUS | Status: DC | PRN
Start: 2016-09-12 — End: 2016-09-12

## 2016-09-12 MED ORDER — LIDOCAINE HCL (PF) 1 % IJ SOLN
INTRAMUSCULAR | Status: AC
  Filled 2016-09-12: qty 30

## 2016-09-12 MED ORDER — LIDOCAINE HCL (PF) 1 % IJ SOLN
INTRAMUSCULAR | Status: DC | PRN
  Administered 2016-09-12: 2 mL

## 2016-09-12 MED ORDER — ACETAMINOPHEN 325 MG PO TABS: 650.0000 mg | ORAL_TABLET | ORAL | Status: DC | PRN

## 2016-09-12 MED ORDER — HEPARIN (PORCINE) IN NACL 2-0.9 UNIT/ML-% IJ SOLN
INTRAMUSCULAR | Status: DC | PRN
  Administered 2016-09-12: 500 mL

## 2016-09-12 MED ORDER — SODIUM CHLORIDE 0.9% FLUSH: 3.0000 mL | Freq: Two times a day (BID) | INTRAVENOUS | Status: DC

## 2016-09-12 MED ORDER — FENTANYL CITRATE (PF) 100 MCG/2ML IJ SOLN
INTRAMUSCULAR | Status: AC
  Filled 2016-09-12: qty 2

## 2016-09-12 MED ORDER — HEPARIN (PORCINE) IN NACL 2-0.9 UNIT/ML-% IJ SOLN
INTRAMUSCULAR | Status: AC
  Filled 2016-09-12: qty 1000

## 2016-09-12 MED ORDER — NITROGLYCERIN 1 MG/10 ML FOR IR/CATH LAB
INTRA_ARTERIAL | Status: AC
  Filled 2016-09-12: qty 10

## 2016-09-12 MED ORDER — SODIUM CHLORIDE 0.9 % IV SOLN: 250.0000 mL | INTRAVENOUS | Status: DC | PRN

## 2016-09-12 MED ORDER — ASPIRIN 81 MG PO CHEW
81.0000 mg | CHEWABLE_TABLET | ORAL | Status: AC
  Administered 2016-09-12: 81 mg via ORAL

## 2016-09-12 MED ORDER — SODIUM CHLORIDE 0.9% FLUSH: 3.0000 mL | INTRAVENOUS | Status: DC | PRN

## 2016-09-12 MED ORDER — ONDANSETRON HCL 4 MG/2ML IJ SOLN: 4.0000 mg | Freq: Four times a day (QID) | INTRAMUSCULAR | Status: DC | PRN

## 2016-09-12 MED ORDER — SODIUM CHLORIDE 0.9 % WEIGHT BASED INFUSION: 1.0000 mL/kg/h | INTRAVENOUS | Status: DC

## 2016-09-12 MED ORDER — FENTANYL CITRATE (PF) 100 MCG/2ML IJ SOLN
INTRAMUSCULAR | Status: DC | PRN
  Administered 2016-09-12: 25 ug via INTRAVENOUS

## 2016-09-12 MED ORDER — SODIUM CHLORIDE 0.9 % IV SOLN
INTRAVENOUS | Status: DC
  Administered 2016-09-12: 08:00:00 via INTRAVENOUS

## 2016-09-12 SURGICAL SUPPLY — 5 items
CATH BALLN WEDGE 5F 110CM (CATHETERS) ×2 IMPLANT
GUIDEWIRE .025 260CM (WIRE) ×2 IMPLANT
PACK CARDIAC CATHETERIZATION (CUSTOM PROCEDURE TRAY) ×2 IMPLANT
SHEATH FAST CATH BRACH 5F 5CM (SHEATH) ×2 IMPLANT
TRANSDUCER W/STOPCOCK (MISCELLANEOUS) ×4 IMPLANT

## 2016-09-12 NOTE — Discharge Instructions (Signed)
Angiogram, Care After °Refer to this sheet in the next few weeks. These instructions provide you with information about caring for yourself after your procedure. Your health care provider may also give you more specific instructions. Your treatment has been planned according to current medical practices, but problems sometimes occur. Call your health care provider if you have any problems or questions after your procedure. °WHAT TO EXPECT AFTER THE PROCEDURE °After your procedure, it is typical to have the following: °· Bruising at the catheter insertion site that usually fades within 1-2 weeks. °· Blood collecting in the tissue (hematoma) that may be painful to the touch. It should usually decrease in size and tenderness within 1-2 weeks. °HOME CARE INSTRUCTIONS °· Take medicines only as directed by your health care provider. °· You may shower 24-48 hours after the procedure or as directed by your health care provider. Remove the bandage (dressing) and gently wash the site with plain soap and water. Pat the area dry with a clean towel. Do not rub the site, because this may cause bleeding. °· Do not take baths, swim, or use a hot tub until your health care provider approves. °· Check your insertion site every day for redness, swelling, or drainage. °· Do not apply powder or lotion to the site. °· Do not lift over 10 lb (4.5 kg) for 1 day after your procedure or as directed by your health care provider. °· Ask your health care provider when it is okay to: °¨ Return to work or school. °¨ Resume usual physical activities or sports. °¨ Resume sexual activity. °· Do not drive home if you are discharged the same day as the procedure. Have someone else drive you. °· You may drive 24 hours after the procedure unless otherwise instructed by your health care provider. °· Do not operate machinery or power tools for 24 hours after the procedure or as directed by your health care provider. °· If your procedure was done as an  outpatient procedure, which means that you went home the same day as your procedure, a responsible adult should be with you for the first 24 hours after you arrive home. °· Keep all follow-up visits as directed by your health care provider. This is important. °SEEK MEDICAL CARE IF: °· You have a fever. °· You have chills. °· You have increased bleeding from the catheter insertion site. Hold pressure on the site. °SEEK IMMEDIATE MEDICAL CARE IF: °· You have unusual pain at the catheter insertion site. °· You have redness, warmth, or swelling at the catheter insertion site. °· You have drainage (other than a small amount of blood on the dressing) from the catheter insertion site. °· The catheter insertion site is bleeding, and the bleeding does not stop after 30 minutes of holding steady pressure on the site. °· The area near or just beyond the catheter insertion site becomes pale, cool, tingly, or numb. °  °This information is not intended to replace advice given to you by your health care provider. Make sure you discuss any questions you have with your health care provider. °  °Document Released: 06/23/2005 Document Revised: 12/26/2014 Document Reviewed: 05/08/2013 °Elsevier Interactive Patient Education ©2016 Elsevier Inc. ° °

## 2016-09-12 NOTE — H&P (View-Only) (Signed)
Subjective:     Patient ID: Julie Page, female   DOB: December 19, 1951, 65 y.o.   MRN: [id number]  HPI   OV 04/28/2016  Chief Complaint  Patient presents with  . Follow-up    Pt here for HFU for pna and ILD. Pt states she is still occassionally having DOE, prod cough with clear mucus. Pt denies CP/tightness.    65 year old female presents with [place] on [facility] for evaluation of interstitial lung disease. History is gained from talking to her, her son review of the outside chart and old chart.  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 This building has mold in it and on and off ever since she started working that she started having respirations symptoms. In 2012 and 2013 she was evaluated by [name] in our institution. 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 There is also history of [name] and rheumatoid factor antibody. At that time pulmonary function test showed isolated low reduction in diffusion capacity that was mild. She only had dyspnea on climbing stairs at that point in time. She continued to be in stable health up until spring of 2017. During this time she continued to work in the building. In the interim she did follow-up with [name] at [facility] rheumatology. Pertinent lab tests are listed below. No systemic evidence of autoimmune disease was discovered and she was on serial follow-up.  Then most recently in April 2017 she had 2 weeks of respiratory exacerbation and then hospitalized for 1 week. CT scan of the chest at this point in time shows classic UIP pattern associated with diffuse groundglass opacity consistent with UIP flare. Significant progression since previous CT scan in 2012. She was discharged on steroid taper which she still is taking and 1 L oxygen. She is extremely physically  deconditioned. She spent some time in inpatient rehabilitation. She is now at home and is going to start home physical therapy. She notices that at room air at rest she would be 92% can be off oxygen for a few hours when she exerts she can drop into the 80s.  Echocardiogram at this point in time shows normal ejection fraction but elevated right ventricular and pulmonary artery systolic pressure to 40 mmHg.  She and her son have many questions pertaining to etiology, prognosis, treatment and outlook and disability application.    Autoimmune labs - Rheumatoid factor s 24 and in April 2013 was 59 in our health system. But with [name] at [facility] was 28 and 102, in 2014 in 2015 respectively - [name] was positive at 1 in April 2017 in our health system and was trace positive at 2.0 with [name] in September 2014. - No evidence that she's had CCP antibody tested for hypersensitivity panel tested -In September 2014 with [name] was trace positive but negative [name] scleroderma 70 SSB and double-stranded DNA antibodies.. Similar result in April 2017.  Waking desat test 185 feet x 3 laps on RA: desat to 86% in 1/2 to 1 lap and stopped    OV 05/24/2016  Chief Complaint  Patient presents with  . Follow-up    pt has not yet had R heart cath, just heard from cards yesterday.  pt has no complaints today.      Follow-up interstitial lung disease provisional  diagnosis of IPF. Presents with her son Julie Page discuss pulmonary function tests and autoimmune results  Last visit sent  CCP antibody and hypersensitivity pneumonitis profile and these were negative. We did pulmonary function test. She could not do her DLCO but FVC is 54% and a moderate severity. Currently she feels physically stronger but still very deconditioned from her admission. She is able to time 1 flight of stairs 1 L oxygen and desaturates to 88% especially when she coughs. Cough is still a problem for which  she is on palliative support with [facility]. She has now got a call from pulmonary rehabilitation and she plans to start that. She is working with cardiology to get an appointment to see [name] for right heart cath. She has not decided about anti-fibrotic therapy. She is very afraid of the side effects. She says she is very sensitive. She is worried about [name] because of sunscreen and she is worried about [name] because of potential cardiac risk. She feels that she wants to get stronger before she can try the medications. Although I cautioned her that IPF is unpredictable and can progress aggressively especially in the first after flare up. Her son Julie Page is in favor of taking her medications but she still not sure we discussed this extensively. She wants to consult with her other son   OV 08/31/2016  Chief Complaint  Patient presents with  . Follow-up    Pt states her breathing is doing well. Pt c/o prod cough with clear mucus. Pt denies CP/tightness and f/c/s.      Follow-up idiopathic pulmonary fibrosis diagnoses made June 2017  This is a routine follow-up. She is now on pulmonary rehabilitation. She uses 1 L of oxygen. Conditioning has improved. Shortness of breath has improved. But she is unable to go off oxygen. She continues to recheck anti-fibrotic therapy. In fact today she also rejected flu shot. In general she is averse to taking medications because of side effect profile. She will have her right heart catheterization on 09/12/2016. Her main symptomatic issue is cough that is occasional but very severe when it happens. She gets fatigue. This unpredictability to its onset. She has codeine cough syrup that helps but she is reluctant to take. Because of side effects. She's not interested in other options such as treatment of sinus drainage or vocal cord irritation or acid reflux therapy or over-the-counter Phenergan cough syrup    has a past medical history of Acute  respiratory failure with hypoxia (HCC); Anemia; CAP (community acquired pneumonia); Chronic diastolic heart failure (HCC); Hypercholesteremia; Hypoxia; ILD (interstitial lung disease) (HCC); Leukocytosis; Malnutrition of moderate degree (HCC); Palpitations; Protein calorie malnutrition (HCC); Pulmonary HTN (HCC); Raynaud's syndrome; and SIRS (systemic inflammatory response syndrome) (HCC).   reports that she has never smoked. She has never used smokeless tobacco.  Past Surgical History:  Procedure Laterality Date  . TUBAL LIGATION  03/30/1978    Allergies  Allergen Reactions  . Azithromycin Other (See Comments)    Burning and itching at IV infusion site  . Spiriva Handihaler [Tiotropium Bromide Monohydrate] Other (See Comments)    "Made me crazy"    Immunization History  Administered Date(s) Administered  . PPD Test 03/31/2016    Family History  Problem Relation Age of Onset  . Heart disease Mother   . Heart disease Maternal Grandfather   . Heart disease Father      Current Outpatient Prescriptions:  .  Ascorbic Acid (VITAMIN C) 100 MG tablet, Take 100  mg by mouth daily., Disp: , Rfl:  .  cholecalciferol (VITAMIN D) 400 units TABS tablet, Take 400 Units by mouth daily., Disp: , Rfl:  .  Digestive Enzymes (DIGESTIVE ENZYME PO), Take by mouth daily., Disp: , Rfl:  .  magnesium 30 MG tablet, Take 30 mg by mouth daily., Disp: , Rfl:  .  Omega-3 Fatty Acids (FISH OIL) 1000 MG CAPS, Take 1 capsule by mouth daily., Disp: , Rfl:  .  TURMERIC CURCUMIN PO, Take by mouth daily., Disp: , Rfl:    Review of Systems     Objective:   Physical Exam  Vitals:   08/31/16 1336  BP: 112/68  Pulse: 93  SpO2: 93%  Weight: 122 lb (55.3 kg)  Height: 5' (1.524 m)   Estimated body mass index is 23.83 kg/m as calculated from the following:   Height as of this encounter: 5' (1.524 m).   Weight as of this encounter: 122 lb (55.3 kg).  General exam: Frail female seated in the exam room.  Looks more conditioned but still deconditioned HEENT: Nasal oxygen on.  no elevated JVP no neck nodes Respiratory exam: Bilateral bibasal crackles are faint. No respiratory distress Cardiovascular: REGULAR RATE AND RHYTHM NORMAL HEART SOUNDS REGULAR RATE AND RHYTHM Abdomen: Soft nontender no organomegaly Extremity: No cyanosis no clubbing no edema Skin exam: Intact on exposed areas      Assessment:       ICD-9-CM ICD-10-CM   1. IPF (idiopathic pulmonary fibrosis) (HCC) 516.31 J84.112   2. Chronic hypoxemic respiratory failure (HCC) 518.83 J96.11    799.02    3. Cough 786.2 R05        Plan:      IPF stable Respect your desire to avoid anti-fibrotic therapy Cough likely due to IPF and difficutl to treat without side effects   - best treatment is your codeine cough syrup as needed but  agreedYou will weigh the side effect profile before you take it Respect your decision to reject flu shot Continue oxygen and pulmonary rehabilitation We will await the results of the right heart catheterization from 09/12/2016 Appreciate the fact that you agreed to join the IPF registry study protocol  -We went over the study details  - Please meet with Carron Curie research assistant [employer] 08/31/2016   Followup 3 months or sooner if needed  - Walk test room air on follow-up  - Spirometry at follow-up     Dr. Kalman Shan, M.D., [facility].C.P Pulmonary and Critical Care Medicine Staff Physician [facility] [place] [facility] Pager: [phone], If no answer or between  15:00h - 7:00h: call [phone]  08/31/2016 1:59 PM

## 2016-09-12 NOTE — Interval H&P Note (Signed)
History and Physical Interval Note:  09/12/2016 9:28 AM  Julie Page  has presented today for surgery, with the diagnosis of chf  The various methods of treatment have been discussed with the patient and family. After consideration of risks, benefits and other options for treatment, the patient has consented to  Procedure(s): Right Heart Cath (N/A) as a surgical intervention .  The patient's history has been reviewed, patient examined, no change in status, stable for surgery.  I have reviewed the patient's chart and labs.  Questions were answered to the patient's satisfaction.     Lemma Tetro [employer]

## 2016-09-13 ENCOUNTER — Encounter (HOSPITAL_COMMUNITY): Admission: RE | Admit: 2016-09-13 | Payer: Medicare Other | Source: Ambulatory Visit

## 2016-09-15 ENCOUNTER — Encounter (HOSPITAL_COMMUNITY)
Admission: RE | Admit: 2016-09-15 | Discharge: 2016-09-15 | Disposition: A | Discharge status: Home / Self Care | Payer: Medicare Other | Source: Ambulatory Visit | Attending: Internal Medicine | Admitting: Internal Medicine

## 2016-09-15 VITALS — Wt 124.3 lb

## 2016-09-15 DIAGNOSIS — E7800 Pure hypercholesterolemia, unspecified: Secondary | ICD-10-CM | POA: Diagnosis not present

## 2016-09-15 DIAGNOSIS — J849 Interstitial pulmonary disease, unspecified: Secondary | ICD-10-CM

## 2016-09-15 DIAGNOSIS — Z79899 Other long term (current) drug therapy: Secondary | ICD-10-CM | POA: Diagnosis not present

## 2016-09-15 DIAGNOSIS — I5032 Chronic diastolic (congestive) heart failure: Secondary | ICD-10-CM | POA: Diagnosis not present

## 2016-09-15 DIAGNOSIS — I7300 Raynaud's syndrome without gangrene: Secondary | ICD-10-CM | POA: Diagnosis not present

## 2016-09-15 DIAGNOSIS — Z7982 Long term (current) use of aspirin: Secondary | ICD-10-CM | POA: Diagnosis not present

## 2016-09-15 NOTE — Progress Notes (Signed)
Daily Session Note  Patient Details  Name: Julie Page MRN: [id number] Date of Birth: [birth year] Referring Provider:   [name] Pulmonary Rehab Walk Test from 06/09/2016 in [place]  Referring Provider  Dr. Chase Caller      Encounter Date: 09/15/2016  Check In:     Session Check In - 09/15/16 1343      Check-In   Location [facility]   Staff Present Rosebud Poles, RN, BSN;[name] diVincenzo, MS, ACSM RCEP, Exercise Physiologist;Lisa Ysidro Evert, RN;Portia Rollene Rotunda, RN, BSN   Supervising physician immediately available to respond to emergencies [facility] immediately available   Physician(s) Dr. Cathlean Sauer   Medication changes reported     No   Fall or balance concerns reported    No   Warm-up and Cool-down Performed as group-led instruction   Resistance Training Performed Yes   VAD Patient? No     Pain Assessment   Currently in Pain? No/denies   Multiple Pain Sites No      Capillary Blood Glucose: No results found for this or any previous visit (from the past 24 hour(s)).      Exercise Prescription Changes - 09/15/16 1600      Response to Exercise   Blood Pressure (Admit) 124/70   Blood Pressure (Exercise) 154/82   Blood Pressure (Exit) 104/60   Heart Rate (Admit) 79 bpm   Heart Rate (Exercise) 113 bpm   Heart Rate (Exit) 73 bpm   Oxygen Saturation (Admit) 98 %   Oxygen Saturation (Exercise) 88 %   Oxygen Saturation (Exit) 98 %   Rating of Perceived Exertion (Exercise) 15   Perceived Dyspnea (Exercise) 3   Duration Progress to 45 minutes of aerobic exercise without signs/symptoms of physical distress   Intensity THRR unchanged     Progression   Progression Continue to progress workloads to maintain intensity without signs/symptoms of physical distress.     Resistance Training   Training Prescription Yes   Weight orange bands   Reps 10-12  10 minutes of strength training     Interval Training   Interval  Training No     Oxygen   Oxygen Continuous   Liters 1     NuStep   Level 4   Minutes 17   METs 2.4     Rower   Level 1   Minutes 17     Goals Met:  Exercise tolerated well Strength training completed today  Goals Unmet:  Not Applicable  Comments: Service time is from 1330 to 1515    Dr. Rush Farmer is Medical Director for Pulmonary Rehab at [facility].

## 2016-09-20 ENCOUNTER — Encounter (HOSPITAL_COMMUNITY)
Admission: RE | Admit: 2016-09-20 | Discharge: 2016-09-20 | Disposition: A | Discharge status: Home / Self Care | Payer: Medicare Other | Source: Ambulatory Visit | Attending: Internal Medicine | Admitting: Internal Medicine

## 2016-09-20 VITALS — Wt 123.5 lb

## 2016-09-20 DIAGNOSIS — I5032 Chronic diastolic (congestive) heart failure: Secondary | ICD-10-CM | POA: Diagnosis not present

## 2016-09-20 DIAGNOSIS — I7300 Raynaud's syndrome without gangrene: Secondary | ICD-10-CM | POA: Diagnosis not present

## 2016-09-20 DIAGNOSIS — J849 Interstitial pulmonary disease, unspecified: Secondary | ICD-10-CM | POA: Insufficient documentation

## 2016-09-20 DIAGNOSIS — E7800 Pure hypercholesterolemia, unspecified: Secondary | ICD-10-CM | POA: Insufficient documentation

## 2016-09-20 DIAGNOSIS — Z7982 Long term (current) use of aspirin: Secondary | ICD-10-CM | POA: Diagnosis not present

## 2016-09-20 DIAGNOSIS — Z79899 Other long term (current) drug therapy: Secondary | ICD-10-CM | POA: Diagnosis not present

## 2016-09-20 NOTE — Progress Notes (Signed)
Daily Session Note  Patient Details  Name: Julie Page MRN: [id number] Date of Birth: September 24, 1951 Referring Provider:   [name] Pulmonary Rehab Walk Test from 06/09/2016 in [place]  Referring Provider  Dr. Chase Caller      Encounter Date: 09/20/2016  Check In:     Session Check In - 09/20/16 1355      Check-In   Location [facility]   Staff Present Rosebud Poles, RN, BSN;[name] diVincenzo, MS, ACSM RCEP, Exercise Physiologist;Portia Rollene Rotunda, RN, Roque Cash, RN   Physician(s) DrMarland Kitchen Choi   Medication changes reported     No   Fall or balance concerns reported    No   Warm-up and Cool-down Performed as group-led Location manager Performed Yes   VAD Patient? No     Pain Assessment   Currently in Pain? No/denies   Multiple Pain Sites No      Capillary Blood Glucose: No results found for this or any previous visit (from the past 24 hour(s)).      Exercise Prescription Changes - 09/20/16 1600      Response to Exercise   Blood Pressure (Admit) 119/77   Blood Pressure (Exercise) 140/76   Blood Pressure (Exit) 100/50   Heart Rate (Admit) 82 bpm   Heart Rate (Exercise) 107 bpm   Heart Rate (Exit) 90 bpm   Oxygen Saturation (Admit) 98 %   Oxygen Saturation (Exercise) 90 %   Oxygen Saturation (Exit) 99 %   Rating of Perceived Exertion (Exercise) 16   Perceived Dyspnea (Exercise) 3   Duration Progress to 45 minutes of aerobic exercise without signs/symptoms of physical distress   Intensity THRR unchanged     Progression   Progression Continue to progress workloads to maintain intensity without signs/symptoms of physical distress.     Resistance Training   Training Prescription Yes   Weight orange bands   Reps 10-12  10 minutes of strength training     Interval Training   Interval Training No     Oxygen   Oxygen Continuous   Liters 1     NuStep   Level 4   Minutes 17   METs 2.5     Rower   Level 1   Minutes 17     Track   Laps 9   Minutes 17     Goals Met:  Exercise tolerated well No report of cardiac concerns or symptoms Strength training completed today  Goals Unmet:  Not Applicable  Comments: Service time is from 1330 to 1515    Dr. Rush Farmer is Medical Director for Pulmonary Rehab at [facility].

## 2016-09-22 ENCOUNTER — Encounter (HOSPITAL_COMMUNITY)
Admission: RE | Admit: 2016-09-22 | Discharge: 2016-09-22 | Disposition: A | Discharge status: Home / Self Care | Payer: Medicare Other | Source: Ambulatory Visit | Attending: Internal Medicine | Admitting: Internal Medicine

## 2016-09-22 VITALS — Wt 123.5 lb

## 2016-09-22 DIAGNOSIS — I7300 Raynaud's syndrome without gangrene: Secondary | ICD-10-CM | POA: Diagnosis not present

## 2016-09-22 DIAGNOSIS — Z79899 Other long term (current) drug therapy: Secondary | ICD-10-CM | POA: Diagnosis not present

## 2016-09-22 DIAGNOSIS — I5032 Chronic diastolic (congestive) heart failure: Secondary | ICD-10-CM | POA: Diagnosis not present

## 2016-09-22 DIAGNOSIS — Z7982 Long term (current) use of aspirin: Secondary | ICD-10-CM | POA: Diagnosis not present

## 2016-09-22 DIAGNOSIS — J849 Interstitial pulmonary disease, unspecified: Secondary | ICD-10-CM

## 2016-09-22 DIAGNOSIS — E7800 Pure hypercholesterolemia, unspecified: Secondary | ICD-10-CM | POA: Diagnosis not present

## 2016-09-22 NOTE — Progress Notes (Signed)
Daily Session Note  Patient Details  Name: Julie Page MRN: [id number] Date of Birth: 1951-05-17 Referring Provider:   [name] Pulmonary Rehab Walk Test from 06/09/2016 in [place]  Referring Provider  Dr. Chase Caller      Encounter Date: 09/22/2016  Check In:     Session Check In - 09/22/16 1354      Check-In   Location [facility]   Staff Present Rosebud Poles, RN, BSN;[name] diVincenzo, MS, ACSM RCEP, Exercise Physiologist;Portia Rollene Rotunda, RN, Roque Cash, RN   Supervising physician immediately available to respond to emergencies [facility] immediately available   Physician(s) Dr. [place] Pink   Medication changes reported     No   Fall or balance concerns reported    No   Warm-up and Cool-down Performed as group-led instruction   Resistance Training Performed Yes   VAD Patient? No     Pain Assessment   Currently in Pain? No/denies   Multiple Pain Sites No      Capillary Blood Glucose: No results found for this or any previous visit (from the past 24 hour(s)).      Exercise Prescription Changes - 09/22/16 1500      Response to Exercise   Blood Pressure (Admit) 102/60   Blood Pressure (Exercise) 130/70   Blood Pressure (Exit) 104/70   Heart Rate (Admit) 88 bpm   Heart Rate (Exercise) 100 bpm   Heart Rate (Exit) 73 bpm   Oxygen Saturation (Admit) 97 %   Oxygen Saturation (Exercise) 88 %   Oxygen Saturation (Exit) 94 %   Rating of Perceived Exertion (Exercise) 16   Perceived Dyspnea (Exercise) 3   Duration Progress to 45 minutes of aerobic exercise without signs/symptoms of physical distress   Intensity THRR unchanged     Progression   Progression Continue to progress workloads to maintain intensity without signs/symptoms of physical distress.     Resistance Training   Training Prescription Yes   Weight orange bands   Reps 10-12  10 minutes of strength training     Interval Training   Interval Training No     Oxygen   Oxygen Continuous   Liters 1     Rower   Level 1   Minutes 17     Track   Laps 9   Minutes 17     Goals Met:  Exercise tolerated well No report of cardiac concerns or symptoms Strength training completed today  Goals Unmet:  Not Applicable  Comments: Service time is from 1330 to 1515    Dr. Rush Farmer is Medical Director for Pulmonary Rehab at [facility].

## 2016-09-27 ENCOUNTER — Encounter (HOSPITAL_COMMUNITY)
Admission: RE | Admit: 2016-09-27 | Discharge: 2016-09-27 | Disposition: A | Discharge status: Home / Self Care | Payer: Medicare Other | Source: Ambulatory Visit | Attending: Internal Medicine | Admitting: Internal Medicine

## 2016-09-27 VITALS — Wt 123.5 lb

## 2016-09-27 DIAGNOSIS — J849 Interstitial pulmonary disease, unspecified: Secondary | ICD-10-CM

## 2016-09-27 DIAGNOSIS — I5032 Chronic diastolic (congestive) heart failure: Secondary | ICD-10-CM | POA: Diagnosis not present

## 2016-09-27 DIAGNOSIS — E7800 Pure hypercholesterolemia, unspecified: Secondary | ICD-10-CM | POA: Diagnosis not present

## 2016-09-27 DIAGNOSIS — Z7982 Long term (current) use of aspirin: Secondary | ICD-10-CM | POA: Diagnosis not present

## 2016-09-27 DIAGNOSIS — Z79899 Other long term (current) drug therapy: Secondary | ICD-10-CM | POA: Diagnosis not present

## 2016-09-27 DIAGNOSIS — I7300 Raynaud's syndrome without gangrene: Secondary | ICD-10-CM | POA: Diagnosis not present

## 2016-09-27 NOTE — Progress Notes (Signed)
Daily Session Note  Patient Details  Name: Julie Page MRN: [id number] Date of Birth: 01-28-1951 Referring Provider:   [name] Pulmonary Rehab Walk Test from 06/09/2016 in [place]  Referring Provider  Dr. Chase Caller      Encounter Date: 09/27/2016  Check In:     Session Check In - 09/27/16 1344      Check-In   Location [facility]   Staff Present Rosebud Poles, RN, BSN;[name] diVincenzo, MS, ACSM RCEP, Exercise Physiologist;Portia Rollene Rotunda, RN, Roque Cash, RN   Supervising physician immediately available to respond to emergencies [facility] immediately available   Physician(s) Dr. Waldron Labs   Medication changes reported     No   Fall or balance concerns reported    No   Warm-up and Cool-down Performed as group-led instruction   Resistance Training Performed Yes   VAD Patient? No     Pain Assessment   Currently in Pain? No/denies   Multiple Pain Sites No      Capillary Blood Glucose: No results found for this or any previous visit (from the past 24 hour(s)).      Exercise Prescription Changes - 09/27/16 1400      Exercise Review   Progression Yes     Response to Exercise   Blood Pressure (Admit) 104/50   Blood Pressure (Exercise) 130/66   Blood Pressure (Exit) 100/50   Heart Rate (Admit) 80 bpm   Heart Rate (Exercise) 102 bpm   Heart Rate (Exit) 78 bpm   Oxygen Saturation (Admit) 98 %   Oxygen Saturation (Exercise) 91 %   Oxygen Saturation (Exit) 100 %   Rating of Perceived Exertion (Exercise) 16   Perceived Dyspnea (Exercise) 3   Duration Progress to 45 minutes of aerobic exercise without signs/symptoms of physical distress   Intensity THRR unchanged     Progression   Progression Continue to progress workloads to maintain intensity without signs/symptoms of physical distress.     Resistance Training   Training Prescription Yes   Weight orange bands   Reps 10-12  10 minutes of strength  training     Interval Training   Interval Training No     Oxygen   Oxygen Continuous   Liters 1     NuStep   Level 5   Minutes 17   METs 2.6     Rower   Level 1   Minutes 17     Track   Laps 8   Minutes 17     Goals Met:  Exercise tolerated well No report of cardiac concerns or symptoms Strength training completed today  Goals Unmet:  Not Applicable  Comments: Service time is from 1330 to 1455    Dr. Rush Farmer is Medical Director for Pulmonary Rehab at [facility].

## 2016-09-27 NOTE — Progress Notes (Signed)
Pulmonary Individual Treatment Plan  Patient Details  Name: PENELOPE FITTRO MRN: [id number] Date of Birth: 02/05/1951 Referring Provider:   Doristine Devoid Pulmonary Rehab Walk Test from 06/09/2016 in [facility] CARDI[facility]  Referring Provider  Dr. Marchelle Gearing      Initial Encounter Date:  Flowsheet Row Pulmonary Rehab Walk Test from 06/09/2016 in [facility] CARDIAC REHAB  Date  06/10/16  Referring Provider  Dr. Marchelle Gearing      Visit Diagnosis: Interstitial lung disease (HCC)  [name] Home Medications on Admission:   Current Outpatient Prescriptions:  .  cholecalciferol (VITAMIN D) 400 units TABS tablet, Take 400 Units by mouth daily., Disp: , Rfl:  .  Cholecalciferol (VITAMIN D3 PO), Take 100 Units by mouth daily., Disp: , Rfl:  .  Digestive Enzymes (DIGESTIVE ENZYME PO), Take 1 tablet by mouth 3 (three) times daily. , Disp: , Rfl:  .  Magnesium 100 MG CAPS, Take 400 mg by mouth every evening., Disp: , Rfl:  .  Omega-3 Fatty Acids (FISH OIL PO), Take 1,500 mg by mouth daily., Disp: , Rfl:  .  OVER THE COUNTER MEDICATION, Take 2 tablets by mouth daily. Serra Peptase protein Digestive Enzyme supplement, Disp: , Rfl:  .  TURMERIC CURCUMIN PO, Take 1,100 mg by mouth daily as needed. Inflammation, Disp: , Rfl:  .  vitamin C (ASCORBIC ACID) 500 MG tablet, Take 500 mg by mouth daily., Disp: , Rfl:   Past Medical History: Past Medical History:  Diagnosis Date  . Acute respiratory failure with hypoxia (HCC)   . Anemia    Chronic disease  . CAP (community acquired pneumonia)   . Chronic diastolic heart failure (HCC)   . Hypercholesteremia   . Hypoxia   . ILD (interstitial lung disease) (HCC)   . Leukocytosis   . Malnutrition of moderate degree (HCC)   . Palpitations   . Protein calorie malnutrition (HCC)   . Pulmonary HTN   . Raynaud's syndrome   . SIRS (systemic inflammatory response syndrome) (HCC)     Tobacco Use: History  Smoking  Status  . Never Smoker  Smokeless Tobacco  . Never Used    Labs: Recent Review Flowsheet Data    Labs for ITP Cardiac and Pulmonary Rehab Latest Ref Rng & Units 09/12/2016 09/12/2016   HCO3 20.0 - 28.0 mmol/L 29.9(H) 27.7   TCO2 0 - 100 mmol/L 31 29   O2SAT % 79.0 78.0      Capillary Blood Glucose: No results found for: GLUCAP   ADL UCSD:     Pulmonary Assessment Scores    Row Name 06/10/16 [phone] 06/23/16 1344       ADL UCSD   ADL Phase  - Entry    SOB Score total  - 75      mMRC Score   mMRC Score 3  -       Pulmonary Function Assessment:     Pulmonary Function Assessment - 06/03/16 1620      Breath   Bilateral Breath Sounds Other  fine crackles on the lower bases, otherwise clear   Shortness of Breath Yes;Fear of Shortness of Breath;Limiting activity      Exercise Target Goals:    Exercise Program Goal: Individual exercise prescription set with THRR, safety & activity barriers. Participant demonstrates ability to understand and report RPE using BORG scale, to self-measure pulse accurately, and to acknowledge the importance of the exercise prescription.  Exercise Prescription Goal: Starting with aerobic activity 30 plus  minutes a day, 3 days per week for initial exercise prescription. Provide home exercise prescription and guidelines that participant acknowledges understanding prior to discharge.  Activity Barriers & Risk Stratification:     Activity Barriers & Cardiac Risk Stratification - 06/03/16 1619      Activity Barriers & Cardiac Risk Stratification   Activity Barriers Deconditioning;Shortness of Breath;Assistive Device      6 Minute Walk:     6 Minute Walk    Row Name 06/10/16 0826         6 Minute Walk   Phase Initial     Distance 800 feet     Walk Time 6 minutes     # of Rest Breaks 0     MPH 1.5     METS 2.15     RPE 12     Perceived Dyspnea  1     Symptoms No     Resting HR 101 bpm     Resting BP 110/64     Max Ex. HR 117  bpm     Max Ex. BP 98/60       Interval HR   Baseline HR 101     1 Minute HR 109     2 Minute HR 115     3 Minute HR 117     4 Minute HR 109     5 Minute HR 108     6 Minute HR 107     2 Minute Post HR 107     Interval Heart Rate? Yes       Interval Oxygen   Baseline Oxygen Saturation % 98 %     Baseline Liters of Oxygen 1 L     1 Minute Oxygen Saturation % 98 %     1 Minute Liters of Oxygen 1 L     2 Minute Oxygen Saturation % 93 %     2 Minute Liters of Oxygen 1 L     3 Minute Oxygen Saturation % 91 %     3 Minute Liters of Oxygen 1 L     4 Minute Oxygen Saturation % 93 %     4 Minute Liters of Oxygen 1 L     5 Minute Oxygen Saturation % 93 %     5 Minute Liters of Oxygen 1 L     6 Minute Oxygen Saturation % 94 %     6 Minute Liters of Oxygen 1 L     2 Minute Post Oxygen Saturation % 99 %     2 Minute Post Liters of Oxygen 1 L        Initial Exercise Prescription:     Initial Exercise Prescription - 06/10/16 0800      Date of Initial Exercise RX and Referring Provider   Date 06/10/16   Referring Provider Dr. Marchelle Gearing     Oxygen   Oxygen Continuous   Liters 1     Prescription Details   Frequency (times per week) 2   Duration Progress to 45 minutes of aerobic exercise without signs/symptoms of physical distress     Intensity   THRR 40-80% of Max Heartrate 62-124   Ratings of Perceived Exertion 11-13   Perceived Dyspnea 0-4     Progression   Progression Continue progressive overload as per policy without signs/symptoms or physical distress.     Resistance Training   Training Prescription Yes   Weight Orange bands   Reps 10-12  Perform Capillary Blood Glucose checks as needed.  Exercise Prescription Changes:     Exercise Prescription Changes    Row Name 06/16/16 1700 06/23/16 1600 06/28/16 1600 06/30/16 1500 07/05/16 1500     Exercise Review   Progression  -  -  - Yes  -     Response to Exercise   Blood Pressure (Admit) 120/60 120/70  98/54 132/70 96/52   Blood Pressure (Exercise) 112/70 140/76 126/70 130/70 120/66   Blood Pressure (Exit) 110/60 110/70 100/56 106/50 102/62   Heart Rate (Admit) 97 bpm 87 bpm 90 bpm 93 bpm 81 bpm   Heart Rate (Exercise) 96 bpm 89 bpm 93 bpm 86 bpm 88 bpm   Heart Rate (Exit) 89 bpm 93 bpm 80 bpm 84 bpm 78 bpm   Oxygen Saturation (Admit) 98 % 98 % 97 % 98 % 98 %   Oxygen Saturation (Exercise) 96 % 94 % 94 % 91 % 93 %   Oxygen Saturation (Exit) 96 % 91 % 98 % 98 % 100 %   Rating of Perceived Exertion (Exercise) 14 15 15 13 13    Perceived Dyspnea (Exercise) 2 2 1 2 2    Duration Progress to 45 minutes of aerobic exercise without signs/symptoms of physical distress Progress to 45 minutes of aerobic exercise without signs/symptoms of physical distress Progress to 45 minutes of aerobic exercise without signs/symptoms of physical distress Progress to 45 minutes of aerobic exercise without signs/symptoms of physical distress Progress to 45 minutes of aerobic exercise without signs/symptoms of physical distress   Intensity THRR New THRR New THRR New THRR unchanged THRR unchanged     Progression   Progression  -  -  - Continue to progress workloads to maintain intensity without signs/symptoms of physical distress. Continue to progress workloads to maintain intensity without signs/symptoms of physical distress.     Resistance Training   Training Prescription Yes Yes Yes Yes Yes   Weight Orange bands Orange bands Orange bands orange bands orange bands   Reps 10-12 10-12 10-12 10-12 10-12  10 minutes of strength training     Interval Training   Interval Training No No No No No     Oxygen   Oxygen Continuous Continuous Continuous Continuous Continuous   Liters 1 1 1 1 1      NuStep   Level  -  - 1 2 2    Minutes  -  - 17 17 17    METs  -  - 1.7 1.6 1.7     Arm Ergometer   Level 1 1 1 1 1    Minutes 17 17 17 17 17      Track   Laps 4 7 8   - 9   Minutes 17 17 17   - 17   Row Name 07/07/16 1500  07/12/16 1543 07/14/16 1542 07/19/16 1600 07/21/16 1546     Exercise Review   Progression Yes  -  -  -  -     Response to Exercise   Blood Pressure (Admit) 104/50 110/56 95/64 90/44  104/80   Blood Pressure (Exercise) 110/60 126/60 98/60 110/60 118/62   Blood Pressure (Exit) 100/60 102/60 112/60 112/64 108/56   Heart Rate (Admit) 88 bpm 96 bpm 85 bpm 81 bpm 87 bpm   Heart Rate (Exercise) 81 bpm 91 bpm 80 bpm 84 bpm 94 bpm   Heart Rate (Exit) 76 bpm 79 bpm 75 bpm 75 bpm 85 bpm   Oxygen Saturation (Admit) 95 % 97 % 98 % 98 %  98 %   Oxygen Saturation (Exercise) 94 % 96 % 92 % 96 % 95 %   Oxygen Saturation (Exit) 99 % 98 % 99 % 97 % 98 %   Rating of Perceived Exertion (Exercise) 14 15 15 17 15    Perceived Dyspnea (Exercise) 2 2 2 2 2    Duration Progress to 45 minutes of aerobic exercise without signs/symptoms of physical distress Progress to 45 minutes of aerobic exercise without signs/symptoms of physical distress Progress to 45 minutes of aerobic exercise without signs/symptoms of physical distress Progress to 45 minutes of aerobic exercise without signs/symptoms of physical distress Progress to 45 minutes of aerobic exercise without signs/symptoms of physical distress   Intensity THRR unchanged THRR unchanged THRR unchanged THRR unchanged THRR unchanged     Progression   Progression Continue to progress workloads to maintain intensity without signs/symptoms of physical distress. Continue to progress workloads to maintain intensity without signs/symptoms of physical distress. Continue to progress workloads to maintain intensity without signs/symptoms of physical distress. Continue to progress workloads to maintain intensity without signs/symptoms of physical distress. Continue to progress workloads to maintain intensity without signs/symptoms of physical distress.     Resistance Training   Training Prescription Yes Yes Yes Yes Yes   Weight orange bands orange bands orange bands orange bands  orange bands   Reps 10-12  10 minutes of strength training 10-12  10 minutes of strength training 10-12  10 minutes of strength training 10-12  10 minutes of strength training 10-12  10 minutes of strength training     Interval Training   Interval Training No No No No No     Oxygen   Oxygen Continuous Continuous Continuous Continuous Continuous   Liters 1 1 1 1 1      NuStep   Level 3 3  - 3 3   Minutes 17 17  - 17 17   METs 1.7 1.9  - 1.9 1.9     Arm Ergometer   Level  - 1 1 1 1    Minutes  - 17 17 17 17      Track   Laps 8 8 8 7   -   Minutes 17 17 17 17   -   Row Name 07/26/16 1500 07/28/16 1600 08/02/16 1500 08/04/16 1500 08/09/16 1500     Exercise Review   Progression  - Yes  - Yes  -     Response to Exercise   Blood Pressure (Admit) 102/60 120/66 98/60 112/60 92/60   Blood Pressure (Exercise) 130/64 110/70 140/70 122/62 120/58   Blood Pressure (Exit) 96/60 104/70 94/60 100/50 96/60   Heart Rate (Admit) 88 bpm 89 bpm 87 bpm 66 bpm 79 bpm   Heart Rate (Exercise) 103 bpm 83 bpm 111 bpm 107 bpm 96 bpm   Heart Rate (Exit) 83 bpm 86 bpm 77 bpm 88 bpm 84 bpm   Oxygen Saturation (Admit) 96 % 99 % 98 % 96 % 98 %   Oxygen Saturation (Exercise) 94 % 92 % 95 % 92 % 95 %   Oxygen Saturation (Exit) 97 % 98 % 99 % 98 % 100 %   Rating of Perceived Exertion (Exercise) 15 14 16 15 17    Perceived Dyspnea (Exercise) 2 1 3 2 3    Duration Progress to 45 minutes of aerobic exercise without signs/symptoms of physical distress Progress to 45 minutes of aerobic exercise without signs/symptoms of physical distress Progress to 45 minutes of aerobic exercise without signs/symptoms of physical distress Progress  to 45 minutes of aerobic exercise without signs/symptoms of physical distress Progress to 45 minutes of aerobic exercise without signs/symptoms of physical distress   Intensity THRR unchanged THRR unchanged THRR unchanged THRR unchanged THRR unchanged     Progression   Progression Continue  to progress workloads to maintain intensity without signs/symptoms of physical distress. Continue to progress workloads to maintain intensity without signs/symptoms of physical distress. Continue to progress workloads to maintain intensity without signs/symptoms of physical distress. Continue to progress workloads to maintain intensity without signs/symptoms of physical distress. Continue to progress workloads to maintain intensity without signs/symptoms of physical distress.     Resistance Training   Training Prescription Yes Yes Yes Yes Yes   Weight orange bands orange bands orange bands orange bands orange bands   Reps 10-12  10 minutes of strength training 10-12  10 mins of strength training 10-12  10 minutes of srength training 10-12  10 minutes of strength training 10-12  10 minutes of strength training     Interval Training   Interval Training No No No No No     Oxygen   Oxygen Continuous Continuous Continuous Continuous Continuous   Liters 1 1 1 1 1      NuStep   Level 3 4 3 4 4    Minutes 17 17 17 17 17    METs 1.9 2.3 1.9 2.4 2.2     Arm Ergometer   Level 1  - 1 1 1    Minutes 17  - 17 17 17      Track   Laps 10 11 10   - 11   Minutes 17 17 17   - 17     Home Exercise Plan   Plans to continue exercise at  -  - Home  -  -   Frequency  -  - Add 3 additional days to program exercise sessions.  -  -   Row Name 08/11/16 1600 08/16/16 1500 08/18/16 1500 08/23/16 1500 08/30/16 1500     Exercise Review   Progression  -  -  -  - Yes     Response to Exercise   Blood Pressure (Admit) 106/64 106/66 104/60 118/62 108/56   Blood Pressure (Exercise) 128/60 120/64 132/92 110/50 120/60   Blood Pressure (Exit) 110/72 64/46 112/60 112/62 100/50   Heart Rate (Admit) 83 bpm 82 bpm 82 bpm 84 bpm 90 bpm   Heart Rate (Exercise) 87 bpm 92 bpm 88 bpm 87 bpm 95 bpm   Heart Rate (Exit) 79 bpm 77 bpm 82 bpm 78 bpm 81 bpm   Oxygen Saturation (Admit) 96 % 100 % 100 % 99 % 97 %   Oxygen Saturation  (Exercise) 98 % 92 % 96 % 96 % 90 %   Oxygen Saturation (Exit) 99 % 99 % 98 % 98 % 93 %   Rating of Perceived Exertion (Exercise) 16 16 16 16 17    Perceived Dyspnea (Exercise) 3 3 3 3 3    Duration Progress to 45 minutes of aerobic exercise without signs/symptoms of physical distress Progress to 45 minutes of aerobic exercise without signs/symptoms of physical distress Progress to 45 minutes of aerobic exercise without signs/symptoms of physical distress Progress to 45 minutes of aerobic exercise without signs/symptoms of physical distress Progress to 45 minutes of aerobic exercise without signs/symptoms of physical distress   Intensity THRR unchanged THRR unchanged THRR unchanged THRR unchanged THRR unchanged     Progression   Progression Continue to progress workloads to maintain intensity without signs/symptoms  of physical distress. Continue to progress workloads to maintain intensity without signs/symptoms of physical distress. Continue to progress workloads to maintain intensity without signs/symptoms of physical distress. Continue to progress workloads to maintain intensity without signs/symptoms of physical distress. Continue to progress workloads to maintain intensity without signs/symptoms of physical distress.     Resistance Training   Training Prescription Yes Yes Yes Yes Yes   Weight orange bands orange bands orange bands orange bands orange bands   Reps 10-12  10 minutes of strength training 10-12  10 minutes of strength training 10-12  10 minutes of strength training 10-12  10 minutes of strength training 10-12  10 minutes of strength training     Interval Training   Interval Training No No No No No     Oxygen   Oxygen Continuous Continuous Continuous Continuous  -   Liters 1 1 1 1   -     NuStep   Level 4 4  - 4 4   Minutes 17 17  - 17 17   METs 2.7 2.3  - 2.1 2.3     Arm Ergometer   Level 1 1 1 1 2    Minutes 17 17 17 17 17      Track   Laps  - 8 11 9 11    Minutes  -  17 17 17 17    Row Name 09/01/16 1523 09/06/16 1517 09/08/16 1500 09/15/16 1600 09/20/16 1600     Response to Exercise   Blood Pressure (Admit) 98/52 116/70 100/50 124/70 119/77   Blood Pressure (Exercise) 104/66 120/60 128/60 154/82 140/76   Blood Pressure (Exit) 92/50 104/60 102/72 104/60 100/50   Heart Rate (Admit) 81 bpm 87 bpm 80 bpm 79 bpm 82 bpm   Heart Rate (Exercise) 86 bpm 98 bpm 112 bpm 113 bpm 107 bpm   Heart Rate (Exit) 79 bpm 78 bpm 83 bpm 73 bpm 90 bpm   Oxygen Saturation (Admit) 99 % 99 % 99 % 98 % 98 %   Oxygen Saturation (Exercise) 94 % 88 % 90 % 88 % 90 %   Oxygen Saturation (Exit) 100 % 100 % 98 % 98 % 99 %   Rating of Perceived Exertion (Exercise) 16 16 14 15 16    Perceived Dyspnea (Exercise) 3 3 2 3 3    Duration Progress to 45 minutes of aerobic exercise without signs/symptoms of physical distress Progress to 45 minutes of aerobic exercise without signs/symptoms of physical distress Progress to 45 minutes of aerobic exercise without signs/symptoms of physical distress Progress to 45 minutes of aerobic exercise without signs/symptoms of physical distress Progress to 45 minutes of aerobic exercise without signs/symptoms of physical distress   Intensity THRR unchanged THRR unchanged THRR unchanged THRR unchanged THRR unchanged     Progression   Progression Continue to progress workloads to maintain intensity without signs/symptoms of physical distress. Continue to progress workloads to maintain intensity without signs/symptoms of physical distress. Continue to progress workloads to maintain intensity without signs/symptoms of physical distress. Continue to progress workloads to maintain intensity without signs/symptoms of physical distress. Continue to progress workloads to maintain intensity without signs/symptoms of physical distress.     Resistance Training   Training Prescription Yes Yes Yes Yes Yes   Weight orange bands orange bands orange bands orange bands orange bands    Reps 10-12  10 minutes of strength training 10-12  10 minutes of strength training 10-12  10 minutes of strength training 10-12  10 minutes of strength training  10-12  10 minutes of strength training     Interval Training   Interval Training No No No No No     Oxygen   Oxygen Continuous Continuous Continuous Continuous Continuous   Liters 1 1 1 1 1      NuStep   Level  - 4  - 4 4   Minutes  - 17  - 17 17   METs  - 2.3  - 2.4 2.5     Arm Ergometer   Level 2 2  -  -  -   Minutes 17 17  -  -  -     Rower   Level  -  - 1 1 1    Minutes  -  - 17 17 17      Track   Laps 9 9 11   - 9   Minutes 17 17 17   - 17   Row Name 09/22/16 1500             Response to Exercise   Blood Pressure (Admit) 102/60       Blood Pressure (Exercise) 130/70       Blood Pressure (Exit) 104/70       Heart Rate (Admit) 88 bpm       Heart Rate (Exercise) 100 bpm       Heart Rate (Exit) 73 bpm       Oxygen Saturation (Admit) 97 %       Oxygen Saturation (Exercise) 88 %       Oxygen Saturation (Exit) 94 %       Rating of Perceived Exertion (Exercise) 16       Perceived Dyspnea (Exercise) 3       Duration Progress to 45 minutes of aerobic exercise without signs/symptoms of physical distress       Intensity THRR unchanged         Progression   Progression Continue to progress workloads to maintain intensity without signs/symptoms of physical distress.         Resistance Training   Training Prescription Yes       Weight orange bands       Reps 10-12  10 minutes of strength training         Interval Training   Interval Training No         Oxygen   Oxygen Continuous       Liters 1         Rower   Level 1       Minutes 17         Track   Laps 9       Minutes 17          Exercise Comments:     Exercise Comments    Row Name 06/30/16 0834 07/28/16 0820 08/02/16 1552 08/29/16 1201 09/06/16 1520   Exercise Comments Has attended 3 exercise sessions, too early to see progress,  tolerating exercise well. Patient is slightly resistant to increasing intensity. Will continue to monitor and progress when appropriate. Completed home exercise program Patient has had a few workload increases. Slow to progress. Reluctant to workload increases. Will cont. to motivate. Patient is nearing graduation. Case manager and I discussed extending her rehab to 30 sessions. Patient is in agreeance.    Row Name 09/26/16 1024           Exercise Comments I have noticed a difference in the patient and how hard she is working each session. Patient is  now doing the rower which she enjoys. Will cont. to monitor progression.           Discharge Exercise Prescription (Final Exercise Prescription Changes):     Exercise Prescription Changes - 09/22/16 1500      Response to Exercise   Blood Pressure (Admit) 102/60   Blood Pressure (Exercise) 130/70   Blood Pressure (Exit) 104/70   Heart Rate (Admit) 88 bpm   Heart Rate (Exercise) 100 bpm   Heart Rate (Exit) 73 bpm   Oxygen Saturation (Admit) 97 %   Oxygen Saturation (Exercise) 88 %   Oxygen Saturation (Exit) 94 %   Rating of Perceived Exertion (Exercise) 16   Perceived Dyspnea (Exercise) 3   Duration Progress to 45 minutes of aerobic exercise without signs/symptoms of physical distress   Intensity THRR unchanged     Progression   Progression Continue to progress workloads to maintain intensity without signs/symptoms of physical distress.     Resistance Training   Training Prescription Yes   Weight orange bands   Reps 10-12  10 minutes of strength training     Interval Training   Interval Training No     Oxygen   Oxygen Continuous   Liters 1     Rower   Level 1   Minutes 17     Track   Laps 9   Minutes 17       Nutrition:  Target Goals: Understanding of nutrition guidelines, daily intake of sodium 1500mg , cholesterol 200mg , calories 30% from fat and 7% or less from saturated fats, daily to have 5 or more servings  of fruits and vegetables.  Biometrics:     Pre Biometrics - 06/10/16 0833      Pre Biometrics   Grip Strength 22 kg       Nutrition Therapy Plan and Nutrition Goals:     Nutrition Therapy & Goals - 07/19/16 1534      Nutrition Therapy   Diet Low Sodium     Personal Nutrition Goals   Personal Goal #1 Prevent further weight loss/maintain current weight while in Pulmonary Rehab     Intervention Plan   Intervention Prescribe, educate and counsel regarding individualized specific dietary modifications aiming towards targeted core components such as weight, hypertension, lipid management, diabetes, heart failure and other comorbidities.   Expected Outcomes Short Term Goal: Understand basic principles of dietary content, such as calories, fat, sodium, cholesterol and nutrients.;Long Term Goal: Adherence to prescribed nutrition plan.      Nutrition Discharge: Rate Your Plate Scores:     Nutrition Assessments - 07/18/16 1541      Rate Your Plate Scores   Pre Score 57      Psychosocial: Target Goals: Acknowledge presence or absence of depression, maximize coping skills, provide positive support system. Participant is able to verbalize types and ability to use techniques and skills needed for reducing stress and depression.  Initial Review & Psychosocial Screening:     Initial Psych Review & Screening - 06/03/16 1623      Family Dynamics   Good Support System? Yes     Screening Interventions   Interventions Encouraged to exercise      Quality of Life Scores:     Quality of Life - 06/23/16 1344      Quality of Life Scores   Health/Function Pre 7.39 %   Socioeconomic Pre 11.56 %   Psych/Spiritual Pre 6.14 %   Family Pre 26 %   GLOBAL Pre 9.91 %  PHQ-9: Recent Review Flowsheet Data    Depression screen [facility] 2/9 06/03/2016 03/13/2016   Decreased Interest 1 0   Down, Depressed, Hopeless 1 0   PHQ - 2 Score 2 0   Altered sleeping 0 -   Tired, decreased  energy 1 -   Change in appetite 0 -   Feeling bad or failure about yourself  1 -   Trouble concentrating 0 -   Moving slowly or fidgety/restless 0 -   Suicidal thoughts 0 -   PHQ-9 Score 4 -   Difficult doing work/chores Not difficult at all -      Psychosocial Evaluation and Intervention:   Psychosocial Re-Evaluation:     Psychosocial Re-Evaluation    Row Name 06/27/16 1141 07/25/16 1548 08/25/16 0839 09/26/16 0904       Psychosocial Re-Evaluation   Interventions Encouraged to attend Pulmonary Rehabilitation for the exercise  - Encouraged to attend Pulmonary Rehabilitation for the exercise Encouraged to attend Pulmonary Rehabilitation for the exercise    Comments No psychosocial concerns identified at this time. -  no concerns identified Good support system No psychosocial issues identified.    Continued Psychosocial Services Needed No No No No      Education: Education Goals: Education classes will be provided on a weekly basis, covering required topics. Participant will state understanding/return demonstration of topics presented.  Learning Barriers/Preferences:     Learning Barriers/Preferences - 06/03/16 1620      Learning Barriers/Preferences   Learning Barriers None   Learning Preferences Group Instruction;Individual Instruction;Written Material      Education Topics: Risk Factor Reduction:  -Group instruction that is supported by a PowerPoint presentation. Instructor discusses the definition of a risk factor, different risk factors for pulmonary disease, and how the heart and lungs work together.     Nutrition for Pulmonary Patient:  -Group instruction provided by PowerPoint slides, verbal discussion, and written materials to support subject matter. The instructor gives an explanation and review of healthy diet recommendations, which includes a discussion on weight management, recommendations for fruit and vegetable consumption, as well as protein, fluid,  caffeine, fiber, sodium, sugar, and alcohol. Tips for eating when patients are short of breath are discussed. Flowsheet Row PULMONARY REHAB OTHER RESPIRATORY from 09/22/2016 in [facility] CARDIAC REHAB  Date  07/28/16  Educator  RD  Instruction Review Code  2- meets goals/outcomes      Pursed Lip Breathing:  -Group instruction that is supported by demonstration and informational handouts. Instructor discusses the benefits of pursed lip and diaphragmatic breathing and detailed demonstration on how to preform both.   Flowsheet Row PULMONARY REHAB OTHER RESPIRATORY from 09/22/2016 in [facility] CARDIAC REHAB  Date  07/14/16  Educator  EP  Instruction Review Code  2- meets goals/outcomes      Oxygen Safety:  -Group instruction provided by PowerPoint, verbal discussion, and written material to support subject matter. There is an overview of "What is Oxygen" and "Why do we need it".  Instructor also reviews how to create a safe environment for oxygen use, the importance of using oxygen as prescribed, and the risks of noncompliance. There is a brief discussion on traveling with oxygen and resources the patient may utilize. Flowsheet Row PULMONARY REHAB OTHER RESPIRATORY from 09/22/2016 in [facility] CARDIAC REHAB  Date  08/11/16  Educator  rn  Instruction Review Code  2- meets goals/outcomes      Oxygen Equipment:  -Group instruction provided by Home  Health Staff utilizing handouts, written materials, and equipment demonstrations. Flowsheet Row PULMONARY REHAB OTHER RESPIRATORY from 09/22/2016 in [facility] CARDIAC REHAB  Date  06/16/16  Educator  Patsy Lager rep  Instruction Review Code  2- meets goals/outcomes      Signs and Symptoms:  -Group instruction provided by written material and verbal discussion to support subject matter. Warning signs and symptoms of infection, stroke, and heart attack are reviewed and when to  call the physician/911 reinforced. Tips for preventing the spread of infection discussed. Flowsheet Row PULMONARY REHAB OTHER RESPIRATORY from 09/22/2016 in [facility] CARDIAC REHAB  Date  09/15/16  Educator  RN  Instruction Review Code  2- meets goals/outcomes      Advanced Directives:  -Group instruction provided by verbal instruction and written material to support subject matter. Instructor reviews Advanced Directive laws and proper instruction for filling out document.   Pulmonary Video:  -Group video education that reviews the importance of medication and oxygen compliance, exercise, good nutrition, pulmonary hygiene, and pursed lip and diaphragmatic breathing for the pulmonary patient. Flowsheet Row PULMONARY REHAB OTHER RESPIRATORY from 09/22/2016 in [facility] CARDIAC REHAB  Date  08/18/16  Instruction Review Code  2- meets goals/outcomes      Exercise for the Pulmonary Patient:  -Group instruction that is supported by a PowerPoint presentation. Instructor discusses benefits of exercise, core components of exercise, frequency, duration, and intensity of an exercise routine, importance of utilizing pulse oximetry during exercise, safety while exercising, and options of places to exercise outside of rehab.   Flowsheet Row PULMONARY REHAB OTHER RESPIRATORY from 09/22/2016 in [facility] CARDIAC REHAB  Date  09/08/16  Educator  EP  Instruction Review Code  2- meets goals/outcomes      Pulmonary Medications:  -Verbally interactive group education provided by instructor with focus on inhaled medications and proper administration.   Anatomy and Physiology of the Respiratory System and Intimacy:  -Group instruction provided by PowerPoint, verbal discussion, and written material to support subject matter. Instructor reviews respiratory cycle and anatomical components of the respiratory system and their functions. Instructor also  reviews differences in obstructive and restrictive respiratory diseases with examples of each. Intimacy, Sex, and Sexuality differences are reviewed with a discussion on how relationships can change when diagnosed with pulmonary disease. Common sexual concerns are reviewed. Flowsheet Row PULMONARY REHAB OTHER RESPIRATORY from 09/22/2016 in [facility] CARDIAC REHAB  Date  07/07/16  Educator  RN  Instruction Review Code  2- meets goals/outcomes      Knowledge Questionnaire Score:     Knowledge Questionnaire Score - 06/23/16 1343      Knowledge Questionnaire Score   Pre Score 12/13      Core Components/Risk Factors/Patient Goals at Admission:     Personal Goals and Risk Factors at Admission - 06/03/16 1621      Core Components/Risk Factors/Patient Goals on Admission   Increase Strength and Stamina Yes   Intervention Provide advice, education, support and counseling about physical activity/exercise needs.;Develop an individualized exercise prescription for aerobic and resistive training based on initial evaluation findings, risk stratification, comorbidities and participant's personal goals.   Expected Outcomes Achievement of increased cardiorespiratory fitness and enhanced flexibility, muscular endurance and strength shown through measurements of functional capacity and personal statement of participant.   Improve shortness of breath with ADL's Yes   Intervention Provide education, individualized exercise plan and daily activity instruction to help decrease symptoms of SOB with  activities of daily living.   Expected Outcomes Short Term: Achieves a reduction of symptoms when performing activities of daily living.   Develop more efficient breathing techniques such as purse lipped breathing and diaphragmatic breathing; and practicing self-pacing with activity Yes   Intervention Provide education, demonstration and support about specific breathing techniuqes utilized for more  efficient breathing. Include techniques such as pursed lipped breathing, diaphragmatic breathing and self-pacing activity.   Expected Outcomes Short Term: Participant will be able to demonstrate and use breathing techniques as needed throughout daily activities.   Increase knowledge of respiratory medications and ability to use respiratory devices properly  Yes   Intervention Provide education and demonstration as needed of appropriate use of medications, inhalers, and oxygen therapy.   Expected Outcomes Short Term: Achieves understanding of medications use. Understands that oxygen is a medication prescribed by physician. Demonstrates appropriate use of inhaler and oxygen therapy.      Core Components/Risk Factors/Patient Goals Review:      Goals and Risk Factor Review    Row Name 06/27/16 1136 07/25/16 1545 08/25/16 0836 09/26/16 0903       Core Components/Risk Factors/Patient Goals Review   Personal Goals Review Increase Strength and Stamina;Improve shortness of breath with ADL's;Develop more efficient breathing techniques such as purse lipped breathing and diaphragmatic breathing and practicing self-pacing with activity.  - Increase Strength and Stamina;Improve shortness of breath with ADL's;Develop more efficient breathing techniques such as purse lipped breathing and diaphragmatic breathing and practicing self-pacing with activity. Increase Strength and Stamina;Improve shortness of breath with ADL's;Develop more efficient breathing techniques such as purse lipped breathing and diaphragmatic breathing and practicing self-pacing with activity.;Increase knowledge of respiratory medications and ability to use respiratory devices properly.    Review Has only attended less than 5 exercise sessions, it is too early to see improvement in strength and stamina has attended 10 exercise sessions, slowly increasing her workloads, still no improvement with in strength and stamina.  She is very weak has has  many "bad" days where she is very tired Has attended 19 exercise sessions, not progressing, very dificult to progress due to weakness and not feeling well Has been extended to 30 exercise sessions due to slow progression., is progressing albeit slow.    Expected Outcomes She should should see a noticeable improvement in the next 30 days. Hopefully will see an improvement in stamina in next 30 days. Has made very slow and minimal progress, resistance to increas workloads due to feeling weak She is fragile, progress slowly       Core Components/Risk Factors/Patient Goals at Discharge (Final Review):      Goals and Risk Factor Review - 09/26/16 0903      Core Components/Risk Factors/Patient Goals Review   Personal Goals Review Increase Strength and Stamina;Improve shortness of breath with ADL's;Develop more efficient breathing techniques such as purse lipped breathing and diaphragmatic breathing and practicing self-pacing with activity.;Increase knowledge of respiratory medications and ability to use respiratory devices properly.   Review Has been extended to 30 exercise sessions due to slow progression., is progressing albeit slow.   Expected Outcomes She is fragile, progress slowly      ITP Comments:   Comments: ITP REVIEW Pt is making expected progress toward pulmonary rehab goals after completing 26 sessions. Recommend continued exercise, life style modification, education, and utilization of breathing techniques to increase stamina and strength and decrease shortness of breath with exertion.

## 2016-09-29 ENCOUNTER — Encounter (HOSPITAL_COMMUNITY): Payer: Medicare Other

## 2016-10-04 ENCOUNTER — Encounter (HOSPITAL_COMMUNITY)
Admission: RE | Admit: 2016-10-04 | Discharge: 2016-10-04 | Disposition: A | Discharge status: Home / Self Care | Payer: Medicare Other | Source: Ambulatory Visit | Attending: Internal Medicine | Admitting: Internal Medicine

## 2016-10-04 VITALS — Wt 124.1 lb

## 2016-10-04 DIAGNOSIS — Z79899 Other long term (current) drug therapy: Secondary | ICD-10-CM | POA: Diagnosis not present

## 2016-10-04 DIAGNOSIS — Z7982 Long term (current) use of aspirin: Secondary | ICD-10-CM | POA: Diagnosis not present

## 2016-10-04 DIAGNOSIS — E7800 Pure hypercholesterolemia, unspecified: Secondary | ICD-10-CM | POA: Diagnosis not present

## 2016-10-04 DIAGNOSIS — J849 Interstitial pulmonary disease, unspecified: Secondary | ICD-10-CM | POA: Diagnosis not present

## 2016-10-04 DIAGNOSIS — I5032 Chronic diastolic (congestive) heart failure: Secondary | ICD-10-CM | POA: Diagnosis not present

## 2016-10-04 DIAGNOSIS — I7300 Raynaud's syndrome without gangrene: Secondary | ICD-10-CM | POA: Diagnosis not present

## 2016-10-04 NOTE — Progress Notes (Signed)
Daily Session Note  Patient Details  Name: Julie Page MRN: [id number] Date of Birth: Aug 20, 1951 Referring Provider:   [name] Pulmonary Rehab Walk Test from 06/09/2016 in [place]  Referring Provider  Dr. Chase Caller      Encounter Date: 10/04/2016  Check In:     Session Check In - 10/04/16 1330      Check-In   Location [facility]   Staff Present Rosebud Poles, RN, BSN;[name] diVincenzo, MS, ACSM RCEP, Exercise Physiologist;Portia Rollene Rotunda, RN, BSN   Supervising physician immediately available to respond to emergencies [facility] immediately available   Physician(s) Dr. Alfredia Ferguson   Medication changes reported     No   Fall or balance concerns reported    No   Warm-up and Cool-down Performed as group-led instruction   Resistance Training Performed Yes   VAD Patient? No     Pain Assessment   Currently in Pain? No/denies   Multiple Pain Sites No      Capillary Blood Glucose: No results found for this or any previous visit (from the past 24 hour(s)).      Exercise Prescription Changes - 10/04/16 1500      Response to Exercise   Blood Pressure (Admit) 96/50   Blood Pressure (Exercise) 110/60   Blood Pressure (Exit) 102/60   Heart Rate (Admit) 79 bpm   Heart Rate (Exercise) 95 bpm   Heart Rate (Exit) 84 bpm   Oxygen Saturation (Admit) 97 %   Oxygen Saturation (Exercise) 94 %   Oxygen Saturation (Exit) 99 %   Rating of Perceived Exertion (Exercise) 16   Perceived Dyspnea (Exercise) 3   Duration Progress to 45 minutes of aerobic exercise without signs/symptoms of physical distress   Intensity THRR unchanged     Progression   Progression Continue to progress workloads to maintain intensity without signs/symptoms of physical distress.     Resistance Training   Training Prescription Yes   Weight orange bands   Reps 10-12  10 minutes of strength training     Interval Training   Interval Training No     Oxygen   Oxygen Continuous   Liters 1     NuStep   Level 5   Minutes 17   METs 2.4     Rower   Level 1   Minutes 17     Track   Laps 9   Minutes 17     Goals Met:  Exercise tolerated well Strength training completed today  Goals Unmet:  Not Applicable  Comments: Service time is from 1330 to 1510    Dr. Rush Farmer is Medical Director for Pulmonary Rehab at [facility].

## 2016-10-06 ENCOUNTER — Telehealth: Payer: Self-pay | Admitting: Internal Medicine

## 2016-10-06 ENCOUNTER — Encounter (HOSPITAL_COMMUNITY): Payer: Medicare Other

## 2016-10-06 NOTE — Telephone Encounter (Signed)
Apply warm dressing If persists, go to ER Can take tylenol or NSAID - ibuprofen but be wary of side effects  Dr. Kalman ShanMurali Sharon Rubis, M.D., [facility]P Pulmonary and Critical Care Medicine Staff Physician  System [place] Pulmonary and Critical Care Pager: [phone], If no answer or between  15:00h - 7:00h: call [phone]  10/06/2016 3:11 PM

## 2016-10-06 NOTE — Telephone Encounter (Signed)
Spoke with pt. Reports having left sided rib pain. States the pain is right under her breast. Pain started a few days ago. Took ASA to help with pt. She thinks this is Costochondritis.  MR - please advise. Thanks.

## 2016-10-06 NOTE — Telephone Encounter (Signed)
Pt aware of MR recommendations and voiced understanding. Pt states she feels that it has slightly improved today. Pt advised to give us a call back if she has any further questions or concerns. nothing further needed.

## 2016-10-11 ENCOUNTER — Encounter (HOSPITAL_COMMUNITY)
Admission: RE | Admit: 2016-10-11 | Discharge: 2016-10-11 | Disposition: A | Discharge status: Home / Self Care | Payer: Medicare Other | Source: Ambulatory Visit | Attending: Internal Medicine | Admitting: Internal Medicine

## 2016-10-11 VITALS — Wt 123.5 lb

## 2016-10-11 DIAGNOSIS — Z7982 Long term (current) use of aspirin: Secondary | ICD-10-CM | POA: Diagnosis not present

## 2016-10-11 DIAGNOSIS — E7800 Pure hypercholesterolemia, unspecified: Secondary | ICD-10-CM | POA: Diagnosis not present

## 2016-10-11 DIAGNOSIS — I5032 Chronic diastolic (congestive) heart failure: Secondary | ICD-10-CM | POA: Diagnosis not present

## 2016-10-11 DIAGNOSIS — I7300 Raynaud's syndrome without gangrene: Secondary | ICD-10-CM | POA: Diagnosis not present

## 2016-10-11 DIAGNOSIS — J849 Interstitial pulmonary disease, unspecified: Secondary | ICD-10-CM

## 2016-10-11 DIAGNOSIS — Z79899 Other long term (current) drug therapy: Secondary | ICD-10-CM | POA: Diagnosis not present

## 2016-10-11 NOTE — Progress Notes (Signed)
Daily Session Note  Patient Details  Name: Julie Page MRN: [id number] Date of Birth: 1950-12-21 Referring Provider:   [name] Pulmonary Rehab Walk Test from 06/09/2016 in [place]  Referring Provider  Dr. Chase Caller      Encounter Date: 10/11/2016  Check In:     Session Check In - 10/11/16 1331      Check-In   Location [facility]   Staff Present Su Hilt, MS, ACSM RCEP, Exercise Physiologist;Joan Leonia Reeves, RN, BSN;Amire Gossen, RN;Portia Rollene Rotunda, RN, BSN   Supervising physician immediately available to respond to emergencies [facility] immediately available   Physician(s) DR. RAMA   Medication changes reported     No   Fall or balance concerns reported    No   Warm-up and Cool-down Performed as group-led instruction   Resistance Training Performed Yes   VAD Patient? No     Pain Assessment   Currently in Pain? No/denies   Multiple Pain Sites No      Capillary Blood Glucose: No results found for this or any previous visit (from the past 24 hour(s)).      Exercise Prescription Changes - 10/11/16 1500      Response to Exercise   Blood Pressure (Admit) 98/60   Blood Pressure (Exercise) 118/70   Blood Pressure (Exit) 96/56   Heart Rate (Admit) 78 bpm   Heart Rate (Exercise) 104 bpm   Heart Rate (Exit) 70 bpm   Oxygen Saturation (Admit) 98 %   Oxygen Saturation (Exercise) 95 %   Oxygen Saturation (Exit) 99 %   Rating of Perceived Exertion (Exercise) 17   Perceived Dyspnea (Exercise) 3   Duration Progress to 45 minutes of aerobic exercise without signs/symptoms of physical distress   Intensity THRR unchanged     Progression   Progression Continue to progress workloads to maintain intensity without signs/symptoms of physical distress.     Resistance Training   Training Prescription Yes   Weight orange bands   Reps 10-12  10 minutes of strength training     Interval Training   Interval  Training No     Oxygen   Oxygen Continuous   Liters 1     NuStep   Level 5   Minutes 17   METs 2.4     Rower   Level 1   Minutes 17     Track   Laps 7   Minutes 17     Goals Met:  Exercise tolerated well No report of cardiac concerns or symptoms Strength training completed today  Goals Unmet:  Not Applicable  Comments: Service time is from 1330 to 1500    Dr. Rush Farmer is Medical Director for Pulmonary Rehab at [facility].

## 2016-10-13 ENCOUNTER — Encounter (HOSPITAL_COMMUNITY)
Admission: RE | Admit: 2016-10-13 | Discharge: 2016-10-13 | Disposition: A | Discharge status: Home / Self Care | Payer: Medicare Other | Source: Ambulatory Visit | Attending: Internal Medicine | Admitting: Internal Medicine

## 2016-10-13 VITALS — Wt 123.9 lb

## 2016-10-13 DIAGNOSIS — J849 Interstitial pulmonary disease, unspecified: Secondary | ICD-10-CM | POA: Diagnosis not present

## 2016-10-13 DIAGNOSIS — E7800 Pure hypercholesterolemia, unspecified: Secondary | ICD-10-CM | POA: Diagnosis not present

## 2016-10-13 DIAGNOSIS — Z79899 Other long term (current) drug therapy: Secondary | ICD-10-CM | POA: Diagnosis not present

## 2016-10-13 DIAGNOSIS — I5032 Chronic diastolic (congestive) heart failure: Secondary | ICD-10-CM | POA: Diagnosis not present

## 2016-10-13 DIAGNOSIS — I7300 Raynaud's syndrome without gangrene: Secondary | ICD-10-CM | POA: Diagnosis not present

## 2016-10-13 DIAGNOSIS — Z7982 Long term (current) use of aspirin: Secondary | ICD-10-CM | POA: Diagnosis not present

## 2016-10-13 NOTE — Progress Notes (Signed)
Daily Session Note  Patient Details  Name: Julie Page MRN: [id number] Date of Birth: 1951/04/14 Referring Provider:   [name] Pulmonary Rehab Walk Test from 06/09/2016 in [place]  Referring Provider  Dr. Chase Caller      Encounter Date: 10/13/2016  Check In:     Session Check In - 10/13/16 1330      Check-In   Location [facility]   Staff Present Su Hilt, MS, ACSM RCEP, Exercise Physiologist;Joan Leonia Reeves, RN, BSN;Lisa Hughes, RN;Aaronmichael Brumbaugh Rollene Rotunda, RN, BSN   Supervising physician immediately available to respond to emergencies [facility] immediately available   Physician(s) Sr. Alesia Morin   Medication changes reported     No   Fall or balance concerns reported    No   Warm-up and Cool-down Performed as group-led Location manager Performed Yes   VAD Patient? No     Pain Assessment   Currently in Pain? No/denies   Multiple Pain Sites No      Capillary Blood Glucose: No results found for this or any previous visit (from the past 24 hour(s)).      Exercise Prescription Changes - 10/13/16 1617      Response to Exercise   Blood Pressure (Admit) 110/50   Blood Pressure (Exercise) 124/70   Blood Pressure (Exit) 122/60   Heart Rate (Admit) 81 bpm   Heart Rate (Exercise) 107 bpm   Heart Rate (Exit) 86 bpm   Oxygen Saturation (Admit) 98 %   Oxygen Saturation (Exercise) 100 %   Oxygen Saturation (Exit) 100 %   Rating of Perceived Exertion (Exercise) 16   Perceived Dyspnea (Exercise) 3   Duration Progress to 45 minutes of aerobic exercise without signs/symptoms of physical distress   Intensity THRR unchanged     Progression   Progression Continue to progress workloads to maintain intensity without signs/symptoms of physical distress.     Resistance Training   Training Prescription Yes   Weight orange bands   Reps 10-12  10 minutes of strength training     Interval Training   Interval Training No     Oxygen   Oxygen Continuous   Liters 1     NuStep   Level 5   Minutes 17   METs 2.7     Rower   Level 1   Minutes 17     Goals Met:  Independence with exercise equipment Improved SOB with ADL's Using PLB without cueing & demonstrates good technique Exercise tolerated well No report of cardiac concerns or symptoms Strength training completed today  Goals Unmet:  Not Applicable  Comments: Service time is from 1330 to 1530. Patient also attended a question and answer session with Dr. Ellin Goodie, pulmonary rehab medical director.   Dr. Rush Farmer is Medical Director for Pulmonary Rehab at [facility].

## 2016-10-18 ENCOUNTER — Encounter (HOSPITAL_COMMUNITY)
Admission: RE | Admit: 2016-10-18 | Discharge: 2016-10-18 | Disposition: A | Discharge status: Home / Self Care | Payer: Medicare Other | Source: Ambulatory Visit | Attending: Internal Medicine | Admitting: Internal Medicine

## 2016-10-18 DIAGNOSIS — I7300 Raynaud's syndrome without gangrene: Secondary | ICD-10-CM | POA: Insufficient documentation

## 2016-10-18 DIAGNOSIS — Z7982 Long term (current) use of aspirin: Secondary | ICD-10-CM | POA: Insufficient documentation

## 2016-10-18 DIAGNOSIS — J849 Interstitial pulmonary disease, unspecified: Secondary | ICD-10-CM | POA: Insufficient documentation

## 2016-10-18 DIAGNOSIS — Z79899 Other long term (current) drug therapy: Secondary | ICD-10-CM | POA: Insufficient documentation

## 2016-10-18 DIAGNOSIS — E7800 Pure hypercholesterolemia, unspecified: Secondary | ICD-10-CM | POA: Insufficient documentation

## 2016-10-18 DIAGNOSIS — I5032 Chronic diastolic (congestive) heart failure: Secondary | ICD-10-CM | POA: Insufficient documentation

## 2016-10-19 ENCOUNTER — Telehealth: Payer: Self-pay | Admitting: Internal Medicine

## 2016-10-19 MED ORDER — CEPHALEXIN 500 MG PO CAPS: 500.0000 mg | ORAL_CAPSULE | Freq: Three times a day (TID) | ORAL | 0 refills | Status: DC

## 2016-10-19 NOTE — Telephone Encounter (Signed)
Likely acute bronchiti/pharyngitisi  Allergies  Allergen Reactions  . Purell Instant Hand [Alcohol] Cough  . Azithromycin Other (See Comments)    Burning and itching at IV infusion site  . Spiriva Handihaler [Tiotropium Bromide Monohydrate] Other (See Comments)    "Made me crazy"    Plan  - cephalexin 500mg  tid x 5 days  - Please take prednisone 40 mg x1 day, then 30 mg x1 day, then 20 mg x1 day, then 10 mg x1 day, and then 5 mg x1 day and stop   Thanks  Dr. Kalman ShanMurali Davone Shinault, M.D., [facility].P Pulmonary and Critical Care Medicine Staff Physician [place] [facility]  Pulmonary and Critical Care Pager: [phone], If no answer or between  15:00h - 7:00h: call [phone]  10/19/2016 11:57 AM

## 2016-10-19 NOTE — Telephone Encounter (Signed)
Spoke with pt. States that she is not feeling well. Reports sore throat, sinus congestion and cough. Cough is producing clear mucus. Denies chest tightness, wheezing, SOB or fever. Declined OV. Would like MR's recommendations.  MR - please advise. Thanks.

## 2016-10-19 NOTE — Telephone Encounter (Signed)
Pt aware of MR recommendations. Cephalexin has been sent to preferred pharmacy. Pt states she has enough prednisone at home already, therefore she will not need a Rx for this. I advised pt to give us a call for any further questions or concerns. Pt voiced understanding. Nothing further needed.

## 2016-10-20 ENCOUNTER — Encounter (HOSPITAL_COMMUNITY): Payer: Medicare Other

## 2016-10-25 ENCOUNTER — Encounter (HOSPITAL_COMMUNITY)
Admission: RE | Admit: 2016-10-25 | Discharge: 2016-10-25 | Disposition: A | Discharge status: Home / Self Care | Payer: Medicare Other | Source: Ambulatory Visit | Attending: Internal Medicine | Admitting: Internal Medicine

## 2016-10-25 DIAGNOSIS — J849 Interstitial pulmonary disease, unspecified: Secondary | ICD-10-CM | POA: Insufficient documentation

## 2016-10-25 DIAGNOSIS — Z79899 Other long term (current) drug therapy: Secondary | ICD-10-CM | POA: Insufficient documentation

## 2016-10-25 DIAGNOSIS — I5032 Chronic diastolic (congestive) heart failure: Secondary | ICD-10-CM | POA: Insufficient documentation

## 2016-10-25 DIAGNOSIS — Z7982 Long term (current) use of aspirin: Secondary | ICD-10-CM | POA: Insufficient documentation

## 2016-10-25 DIAGNOSIS — I7300 Raynaud's syndrome without gangrene: Secondary | ICD-10-CM | POA: Insufficient documentation

## 2016-10-25 DIAGNOSIS — E7800 Pure hypercholesterolemia, unspecified: Secondary | ICD-10-CM | POA: Insufficient documentation

## 2016-10-25 NOTE — Progress Notes (Signed)
Pulmonary Individual Treatment Plan  Patient Details  Name: Julie Page MRN: [id number] Date of Birth: 10/29/1951 Referring Provider:   Doristine DevoidFlowsheet Row Pulmonary Rehab [facility]  Referring Provider  Dr. Marchelle Gearingamaswamy      Initial Encounter Date:  Flowsheet Row [facility]monary Rehab [facility]  Date  06/10/16  Referring Provider  Dr. Marchelle Gearingamaswamy      Visit Diagnosis: Interstitial lung disease (HCC)  [name] Home Medications on Admission:   Current Outpatient Prescriptions:  .  cephALEXin (KEFLEX) 500 MG capsule, Take 1 capsule (500 mg total) by mouth 3 (three) times daily., Disp: 15 capsule, Rfl: 0 .  cholecalciferol (VITAMIN D) 400 units TABS tablet, Take 400 Units by mouth daily., Disp: , Rfl:  .  Cholecalciferol (VITAMIN D3 PO), Take 100 Units by mouth daily., Disp: , Rfl:  .  Digestive Enzymes (DIGESTIVE ENZYME PO), Take 1 tablet by mouth 3 (three) times daily. , Disp: , Rfl:  .  Magnesium 100 MG CAPS, Take 400 mg by mouth every evening., Disp: , Rfl:  .  Omega-3 Fatty Acids (FISH OIL PO), Take 1,500 mg by mouth daily., Disp: , Rfl:  .  OVER THE COUNTER MEDICATION, Take 2 tablets by mouth daily. Serra Peptase protein Digestive Enzyme supplement, Disp: , Rfl:  .  TURMERIC CURCUMIN PO, Take 1,100 mg by mouth daily as needed. Inflammation, Disp: , Rfl:  .  vitamin C (ASCORBIC ACID) 500 MG tablet, Take 500 mg by mouth daily., Disp: , Rfl:   Past Medical History: Past Medical History:  Diagnosis Date  . Acute respiratory failure with hypoxia (HCC)   . Anemia    Chronic disease  . CAP (community acquired pneumonia)   . Chronic diastolic heart failure (HCC)   . Hypercholesteremia   . Hypoxia   . ILD (interstitial lung disease) (HCC)   . Leukocytosis   . Malnutrition of moderate degree (HCC)   . Palpitations   . Protein calorie malnutrition (HCC)   . Pulmonary  HTN   . Raynaud's syndrome   . SIRS (systemic inflammatory response syndrome) (HCC)     Tobacco Use: History  Smoking Status  . Never Smoker  Smokeless Tobacco  . Never Used    Labs: Recent Review Flowsheet Data    Labs for ITP Cardiac and Pulmonary Rehab Latest Ref Rng & Units 09/12/2016 09/12/2016   HCO3 20.0 - 28.0 mmol/L 29.9(H) 27.7   TCO2 0 - 100 mmol/L 31 29   O2SAT % 79.0 78.0      Capillary Blood Glucose: No results found for: GLUCAP   ADL UCSD:     Pulmonary Assessment Scores    Row Name 06/10/16 [id number] 06/23/16 1344       ADL UCSD   ADL Phase  - Entry    SOB Score total  - 75      mMRC Score   mMRC Score 3  -       Pulmonary Function Assessment:     Pulmonary Function Assessment - 06/03/16 1620      Breath   Bilateral Breath Sounds Other  fine crackles on the lower bases, otherwise clear   Shortness of Breath Yes;Fear of Shortness of Breath;Limiting activity      Exercise Target Goals:    Exercise Program Goal: Individual exercise prescription set with THRR, safety & activity barriers. Participant demonstrates ability to understand and report RPE using BORG  scale, to self-measure pulse accurately, and to acknowledge the importance of the exercise prescription.  Exercise Prescription Goal: Starting with aerobic activity 30 plus minutes a day, 3 days per week for initial exercise prescription. Provide home exercise prescription and guidelines that participant acknowledges understanding prior to discharge.  Activity Barriers & Risk Stratification:     Activity Barriers & Cardiac Risk Stratification - 06/03/16 1619      Activity Barriers & Cardiac Risk Stratification   Activity Barriers Deconditioning;Shortness of Breath;Assistive Device      6 Minute Walk:     6 Minute Walk    Row Name 06/10/16 0826         6 Minute Walk   Phase Initial     Distance 800 feet     Walk Time 6 minutes     # of Rest Breaks 0     MPH 1.5     METS  2.15     RPE 12     Perceived Dyspnea  1     Symptoms No     Resting HR 101 bpm     Resting BP 110/64     Max Ex. HR 117 bpm     Max Ex. BP 98/60       Interval HR   Baseline HR 101     1 Minute HR 109     2 Minute HR 115     3 Minute HR 117     4 Minute HR 109     5 Minute HR 108     6 Minute HR 107     2 Minute Post HR 107     Interval Heart Rate? Yes       Interval Oxygen   Baseline Oxygen Saturation % 98 %     Baseline Liters of Oxygen 1 L     1 Minute Oxygen Saturation % 98 %     1 Minute Liters of Oxygen 1 L     2 Minute Oxygen Saturation % 93 %     2 Minute Liters of Oxygen 1 L     3 Minute Oxygen Saturation % 91 %     3 Minute Liters of Oxygen 1 L     4 Minute Oxygen Saturation % 93 %     4 Minute Liters of Oxygen 1 L     5 Minute Oxygen Saturation % 93 %     5 Minute Liters of Oxygen 1 L     6 Minute Oxygen Saturation % 94 %     6 Minute Liters of Oxygen 1 L     2 Minute Post Oxygen Saturation % 99 %     2 Minute Post Liters of Oxygen 1 L        Initial Exercise Prescription:     Initial Exercise Prescription - 06/10/16 0800      Date of Initial Exercise RX and Referring Provider   Date 06/10/16   Referring Provider Dr. Marchelle Gearingamaswamy     Oxygen   Oxygen Continuous   Liters 1     Prescription Details   Frequency (times per week) 2   Duration Progress to 45 minutes of aerobic exercise without signs/symptoms of physical distress     Intensity   THRR 40-80% of Max Heartrate 62-124   Ratings of Perceived Exertion 11-13   Perceived Dyspnea 0-4     Progression   Progression Continue progressive overload as per policy without signs/symptoms or physical distress.  Resistance Training   Training Prescription Yes   Weight Orange bands   Reps 10-12      Perform Capillary Blood Glucose checks as needed.  Exercise Prescription Changes:     Exercise Prescription Changes    Row Name 06/16/16 1700 06/23/16 1600 06/28/16 1600 06/30/16 1500 07/05/16  1500     Exercise Review   Progression  -  -  - Yes  -     Response to Exercise   Blood Pressure (Admit) 120/60 120/70 98/54 132/70 96/52   Blood Pressure (Exercise) 112/70 140/76 126/70 130/70 120/66   Blood Pressure (Exit) 110/60 110/70 100/56 106/50 102/62   Heart Rate (Admit) 97 bpm 87 bpm 90 bpm 93 bpm 81 bpm   Heart Rate (Exercise) 96 bpm 89 bpm 93 bpm 86 bpm 88 bpm   Heart Rate (Exit) 89 bpm 93 bpm 80 bpm 84 bpm 78 bpm   Oxygen Saturation (Admit) 98 % 98 % 97 % 98 % 98 %   Oxygen Saturation (Exercise) 96 % 94 % 94 % 91 % 93 %   Oxygen Saturation (Exit) 96 % 91 % 98 % 98 % 100 %   Rating of Perceived Exertion (Exercise) 14 15 15 13 13    Perceived Dyspnea (Exercise) 2 2 1 2 2    Duration Progress to 45 minutes of aerobic exercise without signs/symptoms of physical distress Progress to 45 minutes of aerobic exercise without signs/symptoms of physical distress Progress to 45 minutes of aerobic exercise without signs/symptoms of physical distress Progress to 45 minutes of aerobic exercise without signs/symptoms of physical distress Progress to 45 minutes of aerobic exercise without signs/symptoms of physical distress   Intensity THRR New THRR New THRR New THRR unchanged THRR unchanged     Progression   Progression  -  -  - Continue to progress workloads to maintain intensity without signs/symptoms of physical distress. Continue to progress workloads to maintain intensity without signs/symptoms of physical distress.     Resistance Training   Training Prescription Yes Yes Yes Yes Yes   Weight Orange bands Orange bands Orange bands orange bands orange bands   Reps 10-12 10-12 10-12 10-12 10-12  10 minutes of strength training     Interval Training   Interval Training No No No No No     Oxygen   Oxygen Continuous Continuous Continuous Continuous Continuous   Liters 1 1 1 1 1      NuStep   Level  -  - 1 2 2    Minutes  -  - 17 17 17    METs  -  - 1.7 1.6 1.7     Arm Ergometer    Level 1 1 1 1 1    Minutes 17 17 17 17 17      Track   Laps 4 7 8   - 9   Minutes 17 17 17   - 17   Row Name 07/07/16 1500 07/12/16 1543 07/14/16 1542 07/19/16 1600 07/21/16 1546     Exercise Review   Progression Yes  -  -  -  -     Response to Exercise   Blood Pressure (Admit) 104/50 110/56 95/64 90/44  104/80   Blood Pressure (Exercise) 110/60 126/60 98/60 110/60 118/62   Blood Pressure (Exit) 100/60 102/60 112/60 112/64 108/56   Heart Rate (Admit) 88 bpm 96 bpm 85 bpm 81 bpm 87 bpm   Heart Rate (Exercise) 81 bpm 91 bpm 80 bpm 84 bpm 94 bpm   Heart Rate (Exit) 76  bpm 79 bpm 75 bpm 75 bpm 85 bpm   Oxygen Saturation (Admit) 95 % 97 % 98 % 98 % 98 %   Oxygen Saturation (Exercise) 94 % 96 % 92 % 96 % 95 %   Oxygen Saturation (Exit) 99 % 98 % 99 % 97 % 98 %   Rating of Perceived Exertion (Exercise) 14 15 15 17 15    Perceived Dyspnea (Exercise) 2 2 2 2 2    Duration Progress to 45 minutes of aerobic exercise without signs/symptoms of physical distress Progress to 45 minutes of aerobic exercise without signs/symptoms of physical distress Progress to 45 minutes of aerobic exercise without signs/symptoms of physical distress Progress to 45 minutes of aerobic exercise without signs/symptoms of physical distress Progress to 45 minutes of aerobic exercise without signs/symptoms of physical distress   Intensity THRR unchanged THRR unchanged THRR unchanged THRR unchanged THRR unchanged     Progression   Progression Continue to progress workloads to maintain intensity without signs/symptoms of physical distress. Continue to progress workloads to maintain intensity without signs/symptoms of physical distress. Continue to progress workloads to maintain intensity without signs/symptoms of physical distress. Continue to progress workloads to maintain intensity without signs/symptoms of physical distress. Continue to progress workloads to maintain intensity without signs/symptoms of physical distress.      Resistance Training   Training Prescription Yes Yes Yes Yes Yes   Weight orange bands orange bands orange bands orange bands orange bands   Reps 10-12  10 minutes of strength training 10-12  10 minutes of strength training 10-12  10 minutes of strength training 10-12  10 minutes of strength training 10-12  10 minutes of strength training     Interval Training   Interval Training No No No No No     Oxygen   Oxygen Continuous Continuous Continuous Continuous Continuous   Liters 1 1 1 1 1      NuStep   Level 3 3  - 3 3   Minutes 17 17  - 17 17   METs 1.7 1.9  - 1.9 1.9     Arm Ergometer   Level  - 1 1 1 1    Minutes  - 17 17 17 17      Track   Laps 8 8 8 7   -   Minutes 17 17 17 17   -   Row Name 07/26/16 1500 07/28/16 1600 08/02/16 1500 08/04/16 1500 08/09/16 1500     Exercise Review   Progression  - Yes  - Yes  -     Response to Exercise   Blood Pressure (Admit) 102/60 120/66 98/60 112/60 92/60   Blood Pressure (Exercise) 130/64 110/70 140/70 122/62 120/58   Blood Pressure (Exit) 96/60 104/70 94/60 100/50 96/60   Heart Rate (Admit) 88 bpm 89 bpm 87 bpm 66 bpm 79 bpm   Heart Rate (Exercise) 103 bpm 83 bpm 111 bpm 107 bpm 96 bpm   Heart Rate (Exit) 83 bpm 86 bpm 77 bpm 88 bpm 84 bpm   Oxygen Saturation (Admit) 96 % 99 % 98 % 96 % 98 %   Oxygen Saturation (Exercise) 94 % 92 % 95 % 92 % 95 %   Oxygen Saturation (Exit) 97 % 98 % 99 % 98 % 100 %   Rating of Perceived Exertion (Exercise) 15 14 16 15 17    Perceived Dyspnea (Exercise) 2 1 3 2 3    Duration Progress to 45 minutes of aerobic exercise without signs/symptoms of physical distress Progress to 45  minutes of aerobic exercise without signs/symptoms of physical distress Progress to 45 minutes of aerobic exercise without signs/symptoms of physical distress Progress to 45 minutes of aerobic exercise without signs/symptoms of physical distress Progress to 45 minutes of aerobic exercise without signs/symptoms of physical distress    Intensity THRR unchanged THRR unchanged THRR unchanged THRR unchanged THRR unchanged     Progression   Progression Continue to progress workloads to maintain intensity without signs/symptoms of physical distress. Continue to progress workloads to maintain intensity without signs/symptoms of physical distress. Continue to progress workloads to maintain intensity without signs/symptoms of physical distress. Continue to progress workloads to maintain intensity without signs/symptoms of physical distress. Continue to progress workloads to maintain intensity without signs/symptoms of physical distress.     Resistance Training   Training Prescription Yes Yes Yes Yes Yes   Weight orange bands orange bands orange bands orange bands orange bands   Reps 10-12  10 minutes of strength training 10-12  10 mins of strength training 10-12  10 minutes of srength training 10-12  10 minutes of strength training 10-12  10 minutes of strength training     Interval Training   Interval Training No No No No No     Oxygen   Oxygen Continuous Continuous Continuous Continuous Continuous   Liters 1 1 1 1 1      NuStep   Level 3 4 3 4 4    Minutes 17 17 17 17 17    METs 1.9 2.3 1.9 2.4 2.2     Arm Ergometer   Level 1  - 1 1 1    Minutes 17  - 17 17 17      Track   Laps 10 11 10   - 11   Minutes 17 17 17   - 17     Home Exercise Plan   Plans to continue exercise at  -  - Home  -  -   Frequency  -  - Add 3 additional days to program exercise sessions.  -  -   Row Name 08/11/16 1600 08/16/16 1500 08/18/16 1500 08/23/16 1500 08/30/16 1500     Exercise Review   Progression  -  -  -  - Yes     Response to Exercise   Blood Pressure (Admit) 106/64 106/66 104/60 118/62 108/56   Blood Pressure (Exercise) 128/60 120/64 132/92 110/50 120/60   Blood Pressure (Exit) 110/72 64/46 112/60 112/62 100/50   Heart Rate (Admit) 83 bpm 82 bpm 82 bpm 84 bpm 90 bpm   Heart Rate (Exercise) 87 bpm 92 bpm 88 bpm 87 bpm 95 bpm    Heart Rate (Exit) 79 bpm 77 bpm 82 bpm 78 bpm 81 bpm   Oxygen Saturation (Admit) 96 % 100 % 100 % 99 % 97 %   Oxygen Saturation (Exercise) 98 % 92 % 96 % 96 % 90 %   Oxygen Saturation (Exit) 99 % 99 % 98 % 98 % 93 %   Rating of Perceived Exertion (Exercise) 16 16 16 16 17    Perceived Dyspnea (Exercise) 3 3 3 3 3    Duration Progress to 45 minutes of aerobic exercise without signs/symptoms of physical distress Progress to 45 minutes of aerobic exercise without signs/symptoms of physical distress Progress to 45 minutes of aerobic exercise without signs/symptoms of physical distress Progress to 45 minutes of aerobic exercise without signs/symptoms of physical distress Progress to 45 minutes of aerobic exercise without signs/symptoms of physical distress   Intensity THRR unchanged THRR unchanged THRR  unchanged THRR unchanged THRR unchanged     Progression   Progression Continue to progress workloads to maintain intensity without signs/symptoms of physical distress. Continue to progress workloads to maintain intensity without signs/symptoms of physical distress. Continue to progress workloads to maintain intensity without signs/symptoms of physical distress. Continue to progress workloads to maintain intensity without signs/symptoms of physical distress. Continue to progress workloads to maintain intensity without signs/symptoms of physical distress.     Resistance Training   Training Prescription Yes Yes Yes Yes Yes   Weight orange bands orange bands orange bands orange bands orange bands   Reps 10-12  10 minutes of strength training 10-12  10 minutes of strength training 10-12  10 minutes of strength training 10-12  10 minutes of strength training 10-12  10 minutes of strength training     Interval Training   Interval Training No No No No No     Oxygen   Oxygen Continuous Continuous Continuous Continuous  -   Liters 1 1 1 1   -     NuStep   Level 4 4  - 4 4   Minutes 17 17  - 17 17   METs  2.7 2.3  - 2.1 2.3     Arm Ergometer   Level 1 1 1 1 2    Minutes 17 17 17 17 17      Track   Laps  - 8 11 9 11    Minutes  - 17 17 17 17    Row Name 09/01/16 1523 09/06/16 1517 09/08/16 1500 09/15/16 1600 09/20/16 1600     Response to Exercise   Blood Pressure (Admit) 98/52 116/70 100/50 124/70 119/77   Blood Pressure (Exercise) 104/66 120/60 128/60 154/82 140/76   Blood Pressure (Exit) 92/50 104/60 102/72 104/60 100/50   Heart Rate (Admit) 81 bpm 87 bpm 80 bpm 79 bpm 82 bpm   Heart Rate (Exercise) 86 bpm 98 bpm 112 bpm 113 bpm 107 bpm   Heart Rate (Exit) 79 bpm 78 bpm 83 bpm 73 bpm 90 bpm   Oxygen Saturation (Admit) 99 % 99 % 99 % 98 % 98 %   Oxygen Saturation (Exercise) 94 % 88 % 90 % 88 % 90 %   Oxygen Saturation (Exit) 100 % 100 % 98 % 98 % 99 %   Rating of Perceived Exertion (Exercise) 16 16 14 15 16    Perceived Dyspnea (Exercise) 3 3 2 3 3    Duration Progress to 45 minutes of aerobic exercise without signs/symptoms of physical distress Progress to 45 minutes of aerobic exercise without signs/symptoms of physical distress Progress to 45 minutes of aerobic exercise without signs/symptoms of physical distress Progress to 45 minutes of aerobic exercise without signs/symptoms of physical distress Progress to 45 minutes of aerobic exercise without signs/symptoms of physical distress   Intensity THRR unchanged THRR unchanged THRR unchanged THRR unchanged THRR unchanged     Progression   Progression Continue to progress workloads to maintain intensity without signs/symptoms of physical distress. Continue to progress workloads to maintain intensity without signs/symptoms of physical distress. Continue to progress workloads to maintain intensity without signs/symptoms of physical distress. Continue to progress workloads to maintain intensity without signs/symptoms of physical distress. Continue to progress workloads to maintain intensity without signs/symptoms of physical distress.      Resistance Training   Training Prescription Yes Yes Yes Yes Yes   Weight orange bands orange bands orange bands orange bands orange bands   Reps 10-12  10 minutes of strength  training 10-12  10 minutes of strength training 10-12  10 minutes of strength training 10-12  10 minutes of strength training 10-12  10 minutes of strength training     Interval Training   Interval Training No No No No No     Oxygen   Oxygen Continuous Continuous Continuous Continuous Continuous   Liters 1 1 1 1 1      NuStep   Level  - 4  - 4 4   Minutes  - 17  - 17 17   METs  - 2.3  - 2.4 2.5     Arm Ergometer   Level 2 2  -  -  -   Minutes 17 17  -  -  -     Rower   Level  -  - 1 1 1    Minutes  -  - 17 17 17      Track   Laps 9 9 11   - 9   Minutes 17 17 17   - 17   Row Name 09/22/16 1500 09/27/16 1400 10/04/16 1500 10/11/16 1500 10/13/16 1617     Exercise Review   Progression  - Yes  -  -  -     Response to Exercise   Blood Pressure (Admit) 102/60 104/50 96/50 98/60  110/50   Blood Pressure (Exercise) 130/70 130/66 110/60 118/70 124/70   Blood Pressure (Exit) 104/70 100/50 102/60 96/56 122/60   Heart Rate (Admit) 88 bpm 80 bpm 79 bpm 78 bpm 81 bpm   Heart Rate (Exercise) 100 bpm 102 bpm 95 bpm 104 bpm 107 bpm   Heart Rate (Exit) 73 bpm 78 bpm 84 bpm 70 bpm 86 bpm   Oxygen Saturation (Admit) 97 % 98 % 97 % 98 % 98 %   Oxygen Saturation (Exercise) 88 % 91 % 94 % 95 % 100 %   Oxygen Saturation (Exit) 94 % 100 % 99 % 99 % 100 %   Rating of Perceived Exertion (Exercise) 16 16 16 17 16    Perceived Dyspnea (Exercise) 3 3 3 3 3    Duration Progress to 45 minutes of aerobic exercise without signs/symptoms of physical distress Progress to 45 minutes of aerobic exercise without signs/symptoms of physical distress Progress to 45 minutes of aerobic exercise without signs/symptoms of physical distress Progress to 45 minutes of aerobic exercise without signs/symptoms of physical distress Progress to 45 minutes  of aerobic exercise without signs/symptoms of physical distress   Intensity THRR unchanged THRR unchanged THRR unchanged THRR unchanged THRR unchanged     Progression   Progression Continue to progress workloads to maintain intensity without signs/symptoms of physical distress. Continue to progress workloads to maintain intensity without signs/symptoms of physical distress. Continue to progress workloads to maintain intensity without signs/symptoms of physical distress. Continue to progress workloads to maintain intensity without signs/symptoms of physical distress. Continue to progress workloads to maintain intensity without signs/symptoms of physical distress.     Resistance Training   Training Prescription Yes Yes Yes Yes Yes   Weight orange bands orange bands orange bands orange bands orange bands   Reps 10-12  10 minutes of strength training 10-12  10 minutes of strength training 10-12  10 minutes of strength training 10-12  10 minutes of strength training 10-12  10 minutes of strength training     Interval Training   Interval Training No No No No No     Oxygen   Oxygen Continuous Continuous Continuous Continuous Continuous   Liters 1  1 1 1 1      NuStep   Level  - 5 5 5 5    Minutes  - 17 17 17 17    METs  - 2.6 2.4 2.4 2.7     Rower   Level 1 1 1 1 1    Minutes 17 17 17 17 17      Track   Laps 9 8 9 7   -   Minutes 17 17 17 17   -      Exercise Comments:     Exercise Comments    Row Name 06/30/16 0834 07/28/16 0820 08/02/16 1552 08/29/16 1201 09/06/16 1520   Exercise Comments Has attended 3 exercise sessions, too early to see progress, tolerating exercise well. Patient is slightly resistant to increasing intensity. Will continue to monitor and progress when appropriate. Completed home exercise program Patient has had a few workload increases. Slow to progress. Reluctant to workload increases. Will cont. to motivate. Patient is nearing graduation. Case manager and I  discussed extending her rehab to 30 sessions. Patient is in agreeance.    Row Name 09/26/16 1024 10/24/16 1024         Exercise Comments I have noticed a difference in the patient and how hard she is working each session. Patient is now doing the rower which she enjoys. Will cont. to monitor progression.  Patient has been out the last week due to illness. Will cont. to monitor and progress when the patient returns to rehab.          Discharge Exercise Prescription (Final Exercise Prescription Changes):     Exercise Prescription Changes - 10/13/16 1617      Response to Exercise   Blood Pressure (Admit) 110/50   Blood Pressure (Exercise) 124/70   Blood Pressure (Exit) 122/60   Heart Rate (Admit) 81 bpm   Heart Rate (Exercise) 107 bpm   Heart Rate (Exit) 86 bpm   Oxygen Saturation (Admit) 98 %   Oxygen Saturation (Exercise) 100 %   Oxygen Saturation (Exit) 100 %   Rating of Perceived Exertion (Exercise) 16   Perceived Dyspnea (Exercise) 3   Duration Progress to 45 minutes of aerobic exercise without signs/symptoms of physical distress   Intensity THRR unchanged     Progression   Progression Continue to progress workloads to maintain intensity without signs/symptoms of physical distress.     Resistance Training   Training Prescription Yes   Weight orange bands   Reps 10-12  10 minutes of strength training     Interval Training   Interval Training No     Oxygen   Oxygen Continuous   Liters 1     NuStep   Level 5   Minutes 17   METs 2.7     Rower   Level 1   Minutes 17       Nutrition:  Target Goals: Understanding of nutrition guidelines, daily intake of sodium 1500mg , cholesterol 200mg , calories 30% from fat and 7% or less from saturated fats, daily to have 5 or more servings of fruits and vegetables.  Biometrics:     Pre Biometrics - 06/10/16 0833      Pre Biometrics   Grip Strength 22 kg       Nutrition Therapy Plan and Nutrition Goals:      Nutrition Therapy & Goals - 07/19/16 1534      Nutrition Therapy   Diet Low Sodium     Personal Nutrition Goals   Personal Goal #1 Prevent further weight  loss/maintain current weight while in Pulmonary Rehab     Intervention Plan   Intervention Prescribe, educate and counsel regarding individualized specific dietary modifications aiming towards targeted core components such as weight, hypertension, lipid management, diabetes, heart failure and other comorbidities.   Expected Outcomes Short Term Goal: Understand basic principles of dietary content, such as calories, fat, sodium, cholesterol and nutrients.;Long Term Goal: Adherence to prescribed nutrition plan.      Nutrition Discharge: Rate Your Plate Scores:     Nutrition Assessments - 07/18/16 1541      Rate Your Plate Scores   Pre Score 57      Psychosocial: Target Goals: Acknowledge presence or absence of depression, maximize coping skills, provide positive support system. Participant is able to verbalize types and ability to use techniques and skills needed for reducing stress and depression.  Initial Review & Psychosocial Screening:     Initial Psych Review & Screening - 06/03/16 1623      Family Dynamics   Good Support System? Yes     Screening Interventions   Interventions Encouraged to exercise      Quality of Life Scores:     Quality of Life - 06/23/16 1344      Quality of Life Scores   Health/Function Pre 7.39 %   Socioeconomic Pre 11.56 %   Psych/Spiritual Pre 6.14 %   Family Pre 26 %   GLOBAL Pre 9.91 %      PHQ-9: Recent Review Flowsheet Data    Depression screen [facility] 2/9 06/03/2016 03/13/2016   Decreased Interest 1 0   Down, Depressed, Hopeless 1 0   PHQ - 2 Score 2 0   Altered sleeping 0 -   Tired, decreased energy 1 -   Change in appetite 0 -   Feeling bad or failure about yourself  1 -   Trouble concentrating 0 -   Moving slowly or fidgety/restless 0 -   Suicidal thoughts 0 -   PHQ-9  Score 4 -   Difficult doing work/chores Not difficult at all -      Psychosocial Evaluation and Intervention:   Psychosocial Re-Evaluation:     Psychosocial Re-Evaluation    Row Name 06/27/16 1141 07/25/16 1548 08/25/16 0839 09/26/16 0904 10/17/16 1236     Psychosocial Re-Evaluation   Interventions Encouraged to attend Pulmonary Rehabilitation for the exercise  - Encouraged to attend Pulmonary Rehabilitation for the exercise Encouraged to attend Pulmonary Rehabilitation for the exercise Encouraged to attend Pulmonary Rehabilitation for the exercise   Comments No psychosocial concerns identified at this time. -  no concerns identified Good support system No psychosocial issues identified. No psychosocial issues identified at this time   Continued Psychosocial Services Needed No No No No No     Education: Education Goals: Education classes will be provided on a weekly basis, covering required topics. Participant will state understanding/return demonstration of topics presented.  Learning Barriers/Preferences:     Learning Barriers/Preferences - 06/03/16 1620      Learning Barriers/Preferences   Learning Barriers None   Learning Preferences Group Instruction;Individual Instruction;Written Material      Education Topics: Risk Factor Reduction:  -Group instruction that is supported by a PowerPoint presentation. Instructor discusses the definition of a risk factor, different risk factors for pulmonary disease, and how the heart and lungs work together.     Nutrition for Pulmonary Patient:  -Group instruction provided by PowerPoint slides, verbal discussion, and written materials to support subject matter. The instructor gives an explanation  and review of healthy diet recommendations, which includes a discussion on weight management, recommendations for fruit and vegetable consumption, as well as protein, fluid, caffeine, fiber, sodium, sugar, and alcohol. Tips for eating when  patients are short of breath are discussed. Flowsheet Row PULMONARY REHAB OTHER RESPIRATORY from 09/22/2016 in [facility] CARDIAC REHAB  Date  07/28/16  Educator  RD  Instruction Review Code  2- meets goals/outcomes      Pursed Lip Breathing:  -Group instruction that is supported by demonstration and informational handouts. Instructor discusses the benefits of pursed lip and diaphragmatic breathing and detailed demonstration on how to preform both.   Flowsheet Row PULMONARY REHAB OTHER RESPIRATORY from 09/22/2016 in [facility] CARDIAC REHAB  Date  07/14/16  Educator  EP  Instruction Review Code  2- meets goals/outcomes      Oxygen Safety:  -Group instruction provided by PowerPoint, verbal discussion, and written material to support subject matter. There is an overview of "What is Oxygen" and "Why do we need it".  Instructor also reviews how to create a safe environment for oxygen use, the importance of using oxygen as prescribed, and the risks of noncompliance. There is a brief discussion on traveling with oxygen and resources the patient may utilize. Flowsheet Row PULMONARY REHAB OTHER RESPIRATORY from 09/22/2016 in [facility] CARDIAC REHAB  Date  08/11/16  Educator  rn  Instruction Review Code  2- meets goals/outcomes      Oxygen Equipment:  -Group instruction provided by [employer] Staff utilizing handouts, written materials, and equipment demonstrations. Flowsheet Row PULMONARY REHAB OTHER RESPIRATORY from 09/22/2016 in [facility] CARDIAC REHAB  Date  06/16/16  Educator  Patsy Lager rep  Instruction Review Code  2- meets goals/outcomes      Signs and Symptoms:  -Group instruction provided by written material and verbal discussion to support subject matter. Warning signs and symptoms of infection, stroke, and heart attack are reviewed and when to call the physician/911 reinforced. Tips for preventing the spread of  infection discussed. Flowsheet Row PULMONARY REHAB OTHER RESPIRATORY from 09/22/2016 in [facility] CARDIAC REHAB  Date  09/15/16  Educator  RN  Instruction Review Code  2- meets goals/outcomes      Advanced Directives:  -Group instruction provided by verbal instruction and written material to support subject matter. Instructor reviews Advanced Directive laws and proper instruction for filling out document.   Pulmonary Video:  -Group video education that reviews the importance of medication and oxygen compliance, exercise, good nutrition, pulmonary hygiene, and pursed lip and diaphragmatic breathing for the pulmonary patient. Flowsheet Row PULMONARY REHAB OTHER RESPIRATORY from 09/22/2016 in [facility] CARDIAC REHAB  Date  08/18/16  Instruction Review Code  2- meets goals/outcomes      Exercise for the Pulmonary Patient:  -Group instruction that is supported by a PowerPoint presentation. Instructor discusses benefits of exercise, core components of exercise, frequency, duration, and intensity of an exercise routine, importance of utilizing pulse oximetry during exercise, safety while exercising, and options of places to exercise outside of rehab.   Flowsheet Row PULMONARY REHAB OTHER RESPIRATORY from 09/22/2016 in [facility] CARDIAC REHAB  Date  09/08/16  Educator  EP  Instruction Review Code  2- meets goals/outcomes      Pulmonary Medications:  -Verbally interactive group education provided by instructor with focus on inhaled medications and proper administration.   Anatomy and Physiology of the Respiratory System and Intimacy:  -  Group instruction provided by PowerPoint, verbal discussion, and written material to support subject matter. Instructor reviews respiratory cycle and anatomical components of the respiratory system and their functions. Instructor also reviews differences in obstructive and restrictive respiratory diseases  with examples of each. Intimacy, Sex, and Sexuality differences are reviewed with a discussion on how relationships can change when diagnosed with pulmonary disease. Common sexual concerns are reviewed. Flowsheet Row PULMONARY REHAB OTHER RESPIRATORY [facility]  Date  07/07/16  Educator  RN  Instruction Review Code  2- meets goals/outcomes      Knowledge Questionnaire Score:     Knowledge Questionnaire Score - 06/23/16 1343      Knowledge Questionnaire Score   Pre Score 12/13      Core Components/Risk Factors/Patient Goals at Admission:     Personal Goals and Risk Factors at Admission - 06/03/16 1621      Core Components/Risk Factors/Patient Goals on Admission   Increase Strength and Stamina Yes   Intervention Provide advice, education, support and counseling about physical activity/exercise needs.;Develop an individualized exercise prescription for aerobic and resistive training based on initial evaluation findings, risk stratification, comorbidities and participant's personal goals.   Expected Outcomes Achievement of increased cardiorespiratory fitness and enhanced flexibility, muscular endurance and strength shown through measurements of functional capacity and personal statement of participant.   Improve shortness of breath with ADL's Yes   Intervention Provide education, individualized exercise plan and daily activity instruction to help decrease symptoms of SOB with activities of daily living.   Expected Outcomes Short Term: Achieves a reduction of symptoms when performing activities of daily living.   Develop more efficient breathing techniques such as purse lipped breathing and diaphragmatic breathing; and practicing self-pacing with activity Yes   Intervention Provide education, demonstration and support about specific breathing techniuqes utilized for more efficient breathing. Include techniques such as pursed lipped  breathing, diaphragmatic breathing and self-pacing activity.   Expected Outcomes Short Term: Participant will be able to demonstrate and use breathing techniques as needed throughout daily activities.   Increase knowledge of respiratory medications and ability to use respiratory devices properly  Yes   Intervention Provide education and demonstration as needed of appropriate use of medications, inhalers, and oxygen therapy.   Expected Outcomes Short Term: Achieves understanding of medications use. Understands that oxygen is a medication prescribed by physician. Demonstrates appropriate use of inhaler and oxygen therapy.      Core Components/Risk Factors/Patient Goals Review:      Goals and Risk Factor Review    Row Name 06/27/16 1136 07/25/16 1545 08/25/16 0836 09/26/16 0903 10/17/16 1224     Core Components/Risk Factors/Patient Goals Review   Personal Goals Review Increase Strength and Stamina;Improve shortness of breath with ADL's;Develop more efficient breathing techniques such as purse lipped breathing and diaphragmatic breathing and practicing self-pacing with activity.  - Increase Strength and Stamina;Improve shortness of breath with ADL's;Develop more efficient breathing techniques such as purse lipped breathing and diaphragmatic breathing and practicing self-pacing with activity. Increase Strength and Stamina;Improve shortness of breath with ADL's;Develop more efficient breathing techniques such as purse lipped breathing and diaphragmatic breathing and practicing self-pacing with activity.;Increase knowledge of respiratory medications and ability to use respiratory devices properly. Develop more efficient breathing techniques such as purse lipped breathing and diaphragmatic breathing and practicing self-pacing with activity.;Improve shortness of breath with ADL's;Increase Strength and Stamina   Review Has only attended less than 5 exercise sessions, it is too early to see  improvement in  strength and stamina has attended 10 exercise sessions, slowly increasing her workloads, still no improvement with in strength and stamina.  She is very weak has has many "bad" days where she is very tired Has attended 19 exercise sessions, not progressing, very dificult to progress due to weakness and not feeling well Has been extended to 30 exercise sessions due to slow progression., is progressing albeit slow. Has been extended to 36 exercise sessions due to slow progression, Is using a rower and increased the nustep to level 5, and walking 8-9 laps on track   Expected Outcomes She should should see a noticeable improvement in the next 30 days. Hopefully will see an improvement in stamina in next 30 days. Has made very slow and minimal progress, resistance to increas workloads due to feeling weak She is fragile, progress slowly  -      Core Components/Risk Factors/Patient Goals at Discharge (Final Review):      Goals and Risk Factor Review - 10/17/16 1224      Core Components/Risk Factors/Patient Goals Review   Personal Goals Review Develop more efficient breathing techniques such as purse lipped breathing and diaphragmatic breathing and practicing self-pacing with activity.;Improve shortness of breath with ADL's;Increase Strength and Stamina   Review Has been extended to 36 exercise sessions due to slow progression, Is using a rower and increased the nustep to level 5, and walking 8-9 laps on track      ITP Comments:   Comments:ITP REVIEW Pt is making expected progress toward pulmonary rehab goals after completing 30 sessions. Recommend continued exercise, life style modification, education, and utilization of breathing techniques to increase stamina and strength and decrease shortness of breath with exertion.

## 2016-10-26 ENCOUNTER — Telehealth: Payer: Self-pay | Admitting: Internal Medicine

## 2016-10-26 MED ORDER — CEPHALEXIN 500 MG PO CAPS: 500.0000 mg | ORAL_CAPSULE | Freq: Three times a day (TID) | ORAL | 0 refills | Status: DC

## 2016-10-26 NOTE — Telephone Encounter (Signed)
Please send additional 5 days of cephalexin.

## 2016-10-26 NOTE — Telephone Encounter (Signed)
MR called pt in prednisone & Cephalexin on 10-19-16. Pt finished abx yesterday and last prednisone was taking on Monday. Pt states she was starting to feel better yesterday. Pt states she has been dragging today. Pt c/o fever of 100.9, prod cough with clear mucus & increased fatigued.  VS please advise.

## 2016-10-26 NOTE — Telephone Encounter (Signed)
rx sent to pharmacy, pt aware of recs.  Nothing further needed.  

## 2016-10-27 ENCOUNTER — Encounter (HOSPITAL_COMMUNITY): Admission: RE | Admit: 2016-10-27 | Payer: Medicare Other | Source: Ambulatory Visit

## 2016-11-01 ENCOUNTER — Encounter (HOSPITAL_COMMUNITY): Payer: Medicare Other

## 2016-11-02 DIAGNOSIS — M9901 Segmental and somatic dysfunction of cervical region: Secondary | ICD-10-CM | POA: Diagnosis not present

## 2016-11-02 DIAGNOSIS — M4712 Other spondylosis with myelopathy, cervical region: Secondary | ICD-10-CM | POA: Diagnosis not present

## 2016-11-02 DIAGNOSIS — M542 Cervicalgia: Secondary | ICD-10-CM | POA: Diagnosis not present

## 2016-11-02 DIAGNOSIS — M40292 Other kyphosis, cervical region: Secondary | ICD-10-CM | POA: Diagnosis not present

## 2016-11-03 ENCOUNTER — Encounter (HOSPITAL_COMMUNITY)
Admission: RE | Admit: 2016-11-03 | Discharge: 2016-11-03 | Disposition: A | Discharge status: Home / Self Care | Payer: Medicare Other | Source: Ambulatory Visit | Attending: Internal Medicine | Admitting: Internal Medicine

## 2016-11-03 VITALS — Wt 119.5 lb

## 2016-11-03 DIAGNOSIS — M4712 Other spondylosis with myelopathy, cervical region: Secondary | ICD-10-CM | POA: Diagnosis not present

## 2016-11-03 DIAGNOSIS — M9901 Segmental and somatic dysfunction of cervical region: Secondary | ICD-10-CM | POA: Diagnosis not present

## 2016-11-03 DIAGNOSIS — Z79899 Other long term (current) drug therapy: Secondary | ICD-10-CM | POA: Diagnosis not present

## 2016-11-03 DIAGNOSIS — I7300 Raynaud's syndrome without gangrene: Secondary | ICD-10-CM | POA: Diagnosis not present

## 2016-11-03 DIAGNOSIS — I5032 Chronic diastolic (congestive) heart failure: Secondary | ICD-10-CM | POA: Diagnosis not present

## 2016-11-03 DIAGNOSIS — M40292 Other kyphosis, cervical region: Secondary | ICD-10-CM | POA: Diagnosis not present

## 2016-11-03 DIAGNOSIS — J849 Interstitial pulmonary disease, unspecified: Secondary | ICD-10-CM | POA: Diagnosis not present

## 2016-11-03 DIAGNOSIS — E7800 Pure hypercholesterolemia, unspecified: Secondary | ICD-10-CM | POA: Diagnosis not present

## 2016-11-03 DIAGNOSIS — M542 Cervicalgia: Secondary | ICD-10-CM | POA: Diagnosis not present

## 2016-11-03 DIAGNOSIS — Z7982 Long term (current) use of aspirin: Secondary | ICD-10-CM | POA: Diagnosis not present

## 2016-11-03 NOTE — Progress Notes (Signed)
Daily Session Note  Patient Details  Name: Julie Page MRN: [id number] Date of Birth: 06/13/1951 Referring Provider:   [name] Pulmonary Rehab Walk Test from 06/09/2016 in [place]  Referring Provider  Dr. Chase Caller      Encounter Date: 11/03/2016  Check In:     Session Check In - 11/03/16 1330      Check-In   Location [facility]   Staff Present Rosebud Poles, RN, BSN;[name] diVincenzo, MS, ACSM RCEP, Exercise Physiologist;Lisa Ysidro Evert, RN;Mattalyn Anderegg Rollene Rotunda, RN, BSN   Supervising physician immediately available to respond to emergencies [facility] immediately available   Physician(s) Dr. Grandville Silos   Medication changes reported     No   Fall or balance concerns reported    No   Warm-up and Cool-down Performed as group-led instruction   Resistance Training Performed Yes   VAD Patient? No     Pain Assessment   Currently in Pain? No/denies   Multiple Pain Sites No      Capillary Blood Glucose: No results found for this or any previous visit (from the past 24 hour(s)).      Exercise Prescription Changes - 11/03/16 1618      Response to Exercise   Blood Pressure (Admit) 126/60   Blood Pressure (Exercise) 120/60   Blood Pressure (Exit) 110/64   Heart Rate (Admit) 101 bpm   Heart Rate (Exercise) 104 bpm   Heart Rate (Exit) 83 bpm   Oxygen Saturation (Admit) 95 %   Oxygen Saturation (Exercise) 95 %   Oxygen Saturation (Exit) 96 %   Rating of Perceived Exertion (Exercise) 15   Perceived Dyspnea (Exercise) 3   Duration Progress to 45 minutes of aerobic exercise without signs/symptoms of physical distress   Intensity THRR unchanged     Progression   Progression Continue to progress workloads to maintain intensity without signs/symptoms of physical distress.     Resistance Training   Training Prescription Yes   Weight orange bands   Reps 10-12  10 minutes of strength training     Interval Training   Interval Training No     Oxygen   Oxygen Continuous   Liters 1     NuStep   Level 5   Minutes 17   METs 2.3     Track   Laps 8   Minutes 17     Goals Met:  Independence with exercise equipment Using PLB without cueing & demonstrates good technique Exercise tolerated well No report of cardiac concerns or symptoms Strength training completed today  Goals Unmet:  Not Applicable  Comments: Service time is from 1330 to 1530   Dr. Rush Farmer is Medical Director for Pulmonary Rehab at [facility].

## 2016-11-07 DIAGNOSIS — M4712 Other spondylosis with myelopathy, cervical region: Secondary | ICD-10-CM | POA: Diagnosis not present

## 2016-11-07 DIAGNOSIS — M9901 Segmental and somatic dysfunction of cervical region: Secondary | ICD-10-CM | POA: Diagnosis not present

## 2016-11-07 DIAGNOSIS — M40292 Other kyphosis, cervical region: Secondary | ICD-10-CM | POA: Diagnosis not present

## 2016-11-07 DIAGNOSIS — M542 Cervicalgia: Secondary | ICD-10-CM | POA: Diagnosis not present

## 2016-11-08 ENCOUNTER — Encounter (HOSPITAL_COMMUNITY)
Admission: RE | Admit: 2016-11-08 | Discharge: 2016-11-08 | Disposition: A | Discharge status: Home / Self Care | Payer: Medicare Other | Source: Ambulatory Visit | Attending: Internal Medicine | Admitting: Internal Medicine

## 2016-11-08 ENCOUNTER — Ambulatory Visit: Payer: Medicare Other

## 2016-11-08 ENCOUNTER — Ambulatory Visit: Payer: Medicare Other | Admitting: Internal Medicine

## 2016-11-08 VITALS — Wt 119.0 lb

## 2016-11-08 DIAGNOSIS — I7300 Raynaud's syndrome without gangrene: Secondary | ICD-10-CM | POA: Diagnosis not present

## 2016-11-08 DIAGNOSIS — Z7982 Long term (current) use of aspirin: Secondary | ICD-10-CM | POA: Diagnosis not present

## 2016-11-08 DIAGNOSIS — I5032 Chronic diastolic (congestive) heart failure: Secondary | ICD-10-CM | POA: Diagnosis not present

## 2016-11-08 DIAGNOSIS — M4712 Other spondylosis with myelopathy, cervical region: Secondary | ICD-10-CM | POA: Diagnosis not present

## 2016-11-08 DIAGNOSIS — M9901 Segmental and somatic dysfunction of cervical region: Secondary | ICD-10-CM | POA: Diagnosis not present

## 2016-11-08 DIAGNOSIS — Z79899 Other long term (current) drug therapy: Secondary | ICD-10-CM | POA: Diagnosis not present

## 2016-11-08 DIAGNOSIS — J849 Interstitial pulmonary disease, unspecified: Secondary | ICD-10-CM

## 2016-11-08 DIAGNOSIS — E7800 Pure hypercholesterolemia, unspecified: Secondary | ICD-10-CM | POA: Diagnosis not present

## 2016-11-08 DIAGNOSIS — M40292 Other kyphosis, cervical region: Secondary | ICD-10-CM | POA: Diagnosis not present

## 2016-11-08 DIAGNOSIS — M542 Cervicalgia: Secondary | ICD-10-CM | POA: Diagnosis not present

## 2016-11-08 NOTE — Progress Notes (Signed)
Daily Session Note  Patient Details  Name: Julie Page MRN: [id number] Date of Birth: [birth year] Referring Provider:   [name] Pulmonary Rehab Walk Test from 06/09/2016 in [place]  Referring Provider  Dr. Chase Caller      Encounter Date: 11/08/2016  Check In:     Session Check In - 11/08/16 1330      Check-In   Location [facility]   Staff Present Rosebud Poles, RN, BSN;[name] diVincenzo, MS, ACSM RCEP, Exercise Physiologist;Lisa Ysidro Evert, RN;Takisha Pelle Rollene Rotunda, RN, BSN   Supervising physician immediately available to respond to emergencies [facility] immediately available   Physician(s) Dr. Grandville Silos   Medication changes reported     No   Fall or balance concerns reported    No   Warm-up and Cool-down Performed as group-led instruction   Resistance Training Performed Yes   VAD Patient? No     Pain Assessment   Currently in Pain? No/denies   Multiple Pain Sites No      Capillary Blood Glucose: No results found for this or any previous visit (from the past 24 hour(s)).      Exercise Prescription Changes - 11/08/16 1526      Response to Exercise   Blood Pressure (Admit) 104/58   Blood Pressure (Exercise) 125/71   Blood Pressure (Exit) 106/54   Heart Rate (Admit) 101 bpm   Heart Rate (Exercise) 124 bpm   Heart Rate (Exit) 93 bpm   Oxygen Saturation (Admit) 98 %   Oxygen Saturation (Exercise) 92 %   Oxygen Saturation (Exit) 95 %   Rating of Perceived Exertion (Exercise) 15   Perceived Dyspnea (Exercise) 3   Duration Progress to 45 minutes of aerobic exercise without signs/symptoms of physical distress   Intensity THRR unchanged     Progression   Progression Continue to progress workloads to maintain intensity without signs/symptoms of physical distress.     Resistance Training   Training Prescription Yes   Weight orange bands   Reps 10-12  10 minutes of strength training     Interval Training   Interval Training No     Oxygen   Oxygen Continuous   Liters 1     NuStep   Level 5   Minutes 17   METs 2.1     Rower   Level 1   Minutes 17     Track   Laps 2   Minutes 17     Goals Met:  Using PLB without cueing & demonstrates good technique Exercise tolerated well No report of cardiac concerns or symptoms Strength training completed today  Goals Unmet:  Not Applicable  Comments: Service time is from 1330 to 1500   Dr. Rush Farmer is Medical Director for Pulmonary Rehab at [facility].

## 2016-11-09 DIAGNOSIS — M4712 Other spondylosis with myelopathy, cervical region: Secondary | ICD-10-CM | POA: Diagnosis not present

## 2016-11-09 DIAGNOSIS — M542 Cervicalgia: Secondary | ICD-10-CM | POA: Diagnosis not present

## 2016-11-09 DIAGNOSIS — M40292 Other kyphosis, cervical region: Secondary | ICD-10-CM | POA: Diagnosis not present

## 2016-11-09 DIAGNOSIS — M9901 Segmental and somatic dysfunction of cervical region: Secondary | ICD-10-CM | POA: Diagnosis not present

## 2016-11-10 ENCOUNTER — Encounter (HOSPITAL_COMMUNITY): Payer: Medicare Other

## 2016-11-15 ENCOUNTER — Encounter (HOSPITAL_COMMUNITY)
Admission: RE | Admit: 2016-11-15 | Discharge: 2016-11-15 | Disposition: A | Discharge status: Home / Self Care | Payer: Medicare Other | Source: Ambulatory Visit | Attending: Internal Medicine | Admitting: Internal Medicine

## 2016-11-15 VITALS — Wt 120.8 lb

## 2016-11-15 DIAGNOSIS — Z79899 Other long term (current) drug therapy: Secondary | ICD-10-CM | POA: Diagnosis not present

## 2016-11-15 DIAGNOSIS — J849 Interstitial pulmonary disease, unspecified: Secondary | ICD-10-CM

## 2016-11-15 DIAGNOSIS — I5032 Chronic diastolic (congestive) heart failure: Secondary | ICD-10-CM | POA: Diagnosis not present

## 2016-11-15 DIAGNOSIS — M4712 Other spondylosis with myelopathy, cervical region: Secondary | ICD-10-CM | POA: Diagnosis not present

## 2016-11-15 DIAGNOSIS — M542 Cervicalgia: Secondary | ICD-10-CM | POA: Diagnosis not present

## 2016-11-15 DIAGNOSIS — M40292 Other kyphosis, cervical region: Secondary | ICD-10-CM | POA: Diagnosis not present

## 2016-11-15 DIAGNOSIS — Z7982 Long term (current) use of aspirin: Secondary | ICD-10-CM | POA: Diagnosis not present

## 2016-11-15 DIAGNOSIS — E7800 Pure hypercholesterolemia, unspecified: Secondary | ICD-10-CM | POA: Diagnosis not present

## 2016-11-15 DIAGNOSIS — M9901 Segmental and somatic dysfunction of cervical region: Secondary | ICD-10-CM | POA: Diagnosis not present

## 2016-11-15 DIAGNOSIS — I7300 Raynaud's syndrome without gangrene: Secondary | ICD-10-CM | POA: Diagnosis not present

## 2016-11-15 NOTE — Progress Notes (Signed)
Daily Session Note  Patient Details  Name: Julie Page MRN: [id number] Date of Birth: April 04, 1951 Referring Provider:   [name] Pulmonary Rehab Walk Test from 06/09/2016 in [place]  Referring Provider  Dr. Chase Caller      Encounter Date: 11/15/2016  Check In:     Session Check In - 11/15/16 1616      Check-In   Location [facility]   Staff Present Rosebud Poles, RN, BSN;Ileane Sando, MS, ACSM RCEP, Exercise Physiologist;Portia Rollene Rotunda, RN, BSN   Supervising physician immediately available to respond to emergencies [facility] immediately available   Physician(s) Dr. Cathlean Sauer   Medication changes reported     No   Fall or balance concerns reported    No   Warm-up and Cool-down Performed as group-led instruction   Resistance Training Performed Yes   VAD Patient? No     Pain Assessment   Currently in Pain? No/denies   Multiple Pain Sites No      Capillary Blood Glucose: No results found for this or any previous visit (from the past 24 hour(s)).      Exercise Prescription Changes - 11/15/16 1600      Response to Exercise   Blood Pressure (Admit) 100/54   Blood Pressure (Exercise) 106/56   Blood Pressure (Exit) 106/60   Heart Rate (Admit) 90 bpm   Heart Rate (Exercise) 100 bpm   Heart Rate (Exit) 82 bpm   Oxygen Saturation (Admit) 97 %   Oxygen Saturation (Exercise) 93 %   Oxygen Saturation (Exit) 96 %   Rating of Perceived Exertion (Exercise) 15   Perceived Dyspnea (Exercise) 3   Duration Progress to 45 minutes of aerobic exercise without signs/symptoms of physical distress   Intensity THRR unchanged     Progression   Progression Continue to progress workloads to maintain intensity without signs/symptoms of physical distress.     Resistance Training   Training Prescription Yes   Weight orange bands   Reps 10-12  10 minutes of strength training     Interval Training   Interval Training No      Oxygen   Oxygen Continuous   Liters 1     NuStep   Level 5   Minutes 17   METs 2.4     Track   Laps 14   Minutes 17     Goals Met:  Exercise tolerated well No report of cardiac concerns or symptoms Strength training completed today  Goals Unmet:  Not Applicable  Comments: Service time is from 1:30PM TO 3:15PM    Dr. Rush Farmer is Medical Director for Pulmonary Rehab at [facility]

## 2016-11-16 DIAGNOSIS — M40292 Other kyphosis, cervical region: Secondary | ICD-10-CM | POA: Diagnosis not present

## 2016-11-16 DIAGNOSIS — M542 Cervicalgia: Secondary | ICD-10-CM | POA: Diagnosis not present

## 2016-11-16 DIAGNOSIS — M9901 Segmental and somatic dysfunction of cervical region: Secondary | ICD-10-CM | POA: Diagnosis not present

## 2016-11-16 DIAGNOSIS — M4712 Other spondylosis with myelopathy, cervical region: Secondary | ICD-10-CM | POA: Diagnosis not present

## 2016-11-17 ENCOUNTER — Encounter (HOSPITAL_COMMUNITY)
Admission: RE | Admit: 2016-11-17 | Discharge: 2016-11-17 | Disposition: A | Discharge status: Home / Self Care | Payer: Medicare Other | Source: Ambulatory Visit | Attending: Internal Medicine | Admitting: Internal Medicine

## 2016-11-17 VITALS — Wt 120.6 lb

## 2016-11-17 DIAGNOSIS — I7300 Raynaud's syndrome without gangrene: Secondary | ICD-10-CM | POA: Diagnosis not present

## 2016-11-17 DIAGNOSIS — J849 Interstitial pulmonary disease, unspecified: Secondary | ICD-10-CM

## 2016-11-17 DIAGNOSIS — M542 Cervicalgia: Secondary | ICD-10-CM | POA: Diagnosis not present

## 2016-11-17 DIAGNOSIS — E7800 Pure hypercholesterolemia, unspecified: Secondary | ICD-10-CM | POA: Diagnosis not present

## 2016-11-17 DIAGNOSIS — M40292 Other kyphosis, cervical region: Secondary | ICD-10-CM | POA: Diagnosis not present

## 2016-11-17 DIAGNOSIS — M4712 Other spondylosis with myelopathy, cervical region: Secondary | ICD-10-CM | POA: Diagnosis not present

## 2016-11-17 DIAGNOSIS — Z79899 Other long term (current) drug therapy: Secondary | ICD-10-CM | POA: Diagnosis not present

## 2016-11-17 DIAGNOSIS — Z7982 Long term (current) use of aspirin: Secondary | ICD-10-CM | POA: Diagnosis not present

## 2016-11-17 DIAGNOSIS — M9901 Segmental and somatic dysfunction of cervical region: Secondary | ICD-10-CM | POA: Diagnosis not present

## 2016-11-17 DIAGNOSIS — I5032 Chronic diastolic (congestive) heart failure: Secondary | ICD-10-CM | POA: Diagnosis not present

## 2016-11-17 NOTE — Progress Notes (Signed)
Daily Session Note  Patient Details  Name: Julie Page MRN: [id number] Date of Birth: Mar 02, 1951 Referring Provider:   [name] Pulmonary Rehab Walk Test from 06/09/2016 in [place]  Referring Provider  Dr. Chase Caller      Encounter Date: 11/17/2016  Check In:     Session Check In - 11/17/16 1330      Check-In   Location [facility]   Staff Present Su Hilt, MS, ACSM RCEP, Exercise Physiologist;Graylen Noboa Ysidro Evert, RN;Joan Leonia Reeves, RN, Luisa Hart, RN, BSN   Supervising physician immediately available to respond to emergencies [facility] immediately available   Physician(s) Dr. Ree Kida   Medication changes reported     No   Fall or balance concerns reported    No   Warm-up and Cool-down Performed as group-led instruction   Resistance Training Performed Yes   VAD Patient? No     Pain Assessment   Currently in Pain? No/denies   Multiple Pain Sites No      Capillary Blood Glucose: No results found for this or any previous visit (from the past 24 hour(s)).      Exercise Prescription Changes - 11/17/16 1500      Response to Exercise   Blood Pressure (Admit) 108/60   Blood Pressure (Exercise) 108/64   Blood Pressure (Exit) 110/68   Heart Rate (Admit) 99 bpm   Heart Rate (Exercise) 100 bpm   Heart Rate (Exit) 83 bpm   Oxygen Saturation (Admit) 93 %   Oxygen Saturation (Exercise) 94 %   Oxygen Saturation (Exit) 100 %   Rating of Perceived Exertion (Exercise) 15   Perceived Dyspnea (Exercise) 3   Duration Progress to 45 minutes of aerobic exercise without signs/symptoms of physical distress   Intensity THRR unchanged     Progression   Progression Continue to progress workloads to maintain intensity without signs/symptoms of physical distress.     Resistance Training   Training Prescription Yes   Weight orange bands   Reps 10-12  10 minutes of strength training     Interval Training   Interval Training No     Oxygen   Oxygen Continuous   Liters 1     NuStep   Level 5   Minutes 17   METs 2.3     Track   Laps 9   Minutes 17     Goals Met:  Exercise tolerated well No report of cardiac concerns or symptoms Strength training completed today  Goals Unmet:  Not Applicable  Comments: Service time is from 1330 to 1520    Dr. Rush Farmer is Medical Director for Pulmonary Rehab at [facility].

## 2016-11-22 ENCOUNTER — Encounter (HOSPITAL_COMMUNITY)
Admission: RE | Admit: 2016-11-22 | Discharge: 2016-11-22 | Disposition: A | Discharge status: Home / Self Care | Payer: Medicare Other | Source: Ambulatory Visit | Attending: Internal Medicine | Admitting: Internal Medicine

## 2016-11-22 DIAGNOSIS — M9901 Segmental and somatic dysfunction of cervical region: Secondary | ICD-10-CM | POA: Diagnosis not present

## 2016-11-22 DIAGNOSIS — I7300 Raynaud's syndrome without gangrene: Secondary | ICD-10-CM | POA: Insufficient documentation

## 2016-11-22 DIAGNOSIS — M4712 Other spondylosis with myelopathy, cervical region: Secondary | ICD-10-CM | POA: Diagnosis not present

## 2016-11-22 DIAGNOSIS — E7800 Pure hypercholesterolemia, unspecified: Secondary | ICD-10-CM | POA: Insufficient documentation

## 2016-11-22 DIAGNOSIS — I5032 Chronic diastolic (congestive) heart failure: Secondary | ICD-10-CM | POA: Diagnosis not present

## 2016-11-22 DIAGNOSIS — Z7982 Long term (current) use of aspirin: Secondary | ICD-10-CM | POA: Insufficient documentation

## 2016-11-22 DIAGNOSIS — Z79899 Other long term (current) drug therapy: Secondary | ICD-10-CM | POA: Insufficient documentation

## 2016-11-22 DIAGNOSIS — J849 Interstitial pulmonary disease, unspecified: Secondary | ICD-10-CM

## 2016-11-22 DIAGNOSIS — M40292 Other kyphosis, cervical region: Secondary | ICD-10-CM | POA: Diagnosis not present

## 2016-11-22 DIAGNOSIS — M542 Cervicalgia: Secondary | ICD-10-CM | POA: Diagnosis not present

## 2016-11-22 NOTE — Progress Notes (Signed)
Pulmonary Individual Treatment Plan  Patient Details  Name: Julie Page MRN: [id number] Date of Birth: 10/29/1951 Referring Provider:   Doristine DevoidFlowsheet Row Pulmonary Rehab Walk [facility]  Referring Provider  Dr. Marchelle Gearingamaswamy      Initial Encounter Date:  Flowsheet Row Pulmonary Rehab [facility]  Date  06/10/16  Referring Provider  Dr. Marchelle Gearingamaswamy      Visit Diagnosis: Interstitial lung disease (HCC)  [name] Home Medications on Admission:   Current Outpatient Prescriptions:  .  cephALEXin (KEFLEX) 500 MG capsule, Take 1 capsule (500 mg total) by mouth 3 (three) times daily., Disp: 15 capsule, Rfl: 0 .  cholecalciferol (VITAMIN D) 400 units TABS tablet, Take 400 Units by mouth daily., Disp: , Rfl:  .  Cholecalciferol (VITAMIN D3 PO), Take 100 Units by mouth daily., Disp: , Rfl:  .  Digestive Enzymes (DIGESTIVE ENZYME PO), Take 1 tablet by mouth 3 (three) times daily. , Disp: , Rfl:  .  Magnesium 100 MG CAPS, Take 400 mg by mouth every evening., Disp: , Rfl:  .  Omega-3 Fatty Acids (FISH OIL PO), Take 1,500 mg by mouth daily., Disp: , Rfl:  .  OVER THE COUNTER MEDICATION, Take 2 tablets by mouth daily. Serra Peptase protein Digestive Enzyme supplement, Disp: , Rfl:  .  TURMERIC CURCUMIN PO, Take 1,100 mg by mouth daily as needed. Inflammation, Disp: , Rfl:  .  vitamin C (ASCORBIC ACID) 500 MG tablet, Take 500 mg by mouth daily., Disp: , Rfl:   Past Medical History: Past Medical History:  Diagnosis Date  . Acute respiratory failure with hypoxia (HCC)   . Anemia    Chronic disease  . CAP (community acquired pneumonia)   . Chronic diastolic heart failure (HCC)   . Hypercholesteremia   . Hypoxia   . ILD (interstitial lung disease) (HCC)   . Leukocytosis   . Malnutrition of moderate degree (HCC)   . Palpitations   . Protein calorie malnutrition (HCC)   . Pulmonary  HTN   . Raynaud's syndrome   . SIRS (systemic inflammatory response syndrome) (HCC)     Tobacco Use: History  Smoking Status  . Never Smoker  Smokeless Tobacco  . Never Used    Labs: Recent Review Flowsheet Data    Labs for ITP Cardiac and Pulmonary Rehab Latest Ref Rng & Units 09/12/2016 09/12/2016   HCO3 20.0 - 28.0 mmol/L 29.9(H) 27.7   TCO2 0 - 100 mmol/L 31 29   O2SAT % 79.0 78.0      Capillary Blood Glucose: No results found for: GLUCAP   ADL UCSD:     Pulmonary Assessment Scores    Row Name 06/10/16 [id number] 06/23/16 1344       ADL UCSD   ADL Phase  - Entry    SOB Score total  - 75      mMRC Score   mMRC Score 3  -       Pulmonary Function Assessment:     Pulmonary Function Assessment - 06/03/16 1620      Breath   Bilateral Breath Sounds Other  fine crackles on the lower bases, otherwise clear   Shortness of Breath Yes;Fear of Shortness of Breath;Limiting activity      Exercise Target Goals:    Exercise Program Goal: Individual exercise prescription set with THRR, safety & activity barriers. Participant demonstrates ability to understand and report RPE using BORG  scale, to self-measure pulse accurately, and to acknowledge the importance of the exercise prescription.  Exercise Prescription Goal: Starting with aerobic activity 30 plus minutes a day, 3 days per week for initial exercise prescription. Provide home exercise prescription and guidelines that participant acknowledges understanding prior to discharge.  Activity Barriers & Risk Stratification:     Activity Barriers & Cardiac Risk Stratification - 06/03/16 1619      Activity Barriers & Cardiac Risk Stratification   Activity Barriers Deconditioning;Shortness of Breath;Assistive Device      6 Minute Walk:     6 Minute Walk    Row Name 06/10/16 0826         6 Minute Walk   Phase Initial     Distance 800 feet     Walk Time 6 minutes     # of Rest Breaks 0     MPH 1.5     METS  2.15     RPE 12     Perceived Dyspnea  1     Symptoms No     Resting HR 101 bpm     Resting BP 110/64     Max Ex. HR 117 bpm     Max Ex. BP 98/60       Interval HR   Baseline HR 101     1 Minute HR 109     2 Minute HR 115     3 Minute HR 117     4 Minute HR 109     5 Minute HR 108     6 Minute HR 107     2 Minute Post HR 107     Interval Heart Rate? Yes       Interval Oxygen   Baseline Oxygen Saturation % 98 %     Baseline Liters of Oxygen 1 L     1 Minute Oxygen Saturation % 98 %     1 Minute Liters of Oxygen 1 L     2 Minute Oxygen Saturation % 93 %     2 Minute Liters of Oxygen 1 L     3 Minute Oxygen Saturation % 91 %     3 Minute Liters of Oxygen 1 L     4 Minute Oxygen Saturation % 93 %     4 Minute Liters of Oxygen 1 L     5 Minute Oxygen Saturation % 93 %     5 Minute Liters of Oxygen 1 L     6 Minute Oxygen Saturation % 94 %     6 Minute Liters of Oxygen 1 L     2 Minute Post Oxygen Saturation % 99 %     2 Minute Post Liters of Oxygen 1 L        Initial Exercise Prescription:     Initial Exercise Prescription - 06/10/16 0800      Date of Initial Exercise RX and Referring Provider   Date 06/10/16   Referring Provider Dr. Marchelle Gearingamaswamy     Oxygen   Oxygen Continuous   Liters 1     Prescription Details   Frequency (times per week) 2   Duration Progress to 45 minutes of aerobic exercise without signs/symptoms of physical distress     Intensity   THRR 40-80% of Max Heartrate 62-124   Ratings of Perceived Exertion 11-13   Perceived Dyspnea 0-4     Progression   Progression Continue progressive overload as per policy without signs/symptoms or physical distress.  Resistance Training   Training Prescription Yes   Weight Orange bands   Reps 10-12      Perform Capillary Blood Glucose checks as needed.  Exercise Prescription Changes:     Exercise Prescription Changes    Row Name 06/16/16 1700 06/23/16 1600 06/28/16 1600 06/30/16 1500 07/05/16  1500     Exercise Review   Progression  -  -  - Yes  -     Response to Exercise   Blood Pressure (Admit) 120/60 120/70 98/54 132/70 96/52   Blood Pressure (Exercise) 112/70 140/76 126/70 130/70 120/66   Blood Pressure (Exit) 110/60 110/70 100/56 106/50 102/62   Heart Rate (Admit) 97 bpm 87 bpm 90 bpm 93 bpm 81 bpm   Heart Rate (Exercise) 96 bpm 89 bpm 93 bpm 86 bpm 88 bpm   Heart Rate (Exit) 89 bpm 93 bpm 80 bpm 84 bpm 78 bpm   Oxygen Saturation (Admit) 98 % 98 % 97 % 98 % 98 %   Oxygen Saturation (Exercise) 96 % 94 % 94 % 91 % 93 %   Oxygen Saturation (Exit) 96 % 91 % 98 % 98 % 100 %   Rating of Perceived Exertion (Exercise) 14 15 15 13 13    Perceived Dyspnea (Exercise) 2 2 1 2 2    Duration Progress to 45 minutes of aerobic exercise without signs/symptoms of physical distress Progress to 45 minutes of aerobic exercise without signs/symptoms of physical distress Progress to 45 minutes of aerobic exercise without signs/symptoms of physical distress Progress to 45 minutes of aerobic exercise without signs/symptoms of physical distress Progress to 45 minutes of aerobic exercise without signs/symptoms of physical distress   Intensity THRR New THRR New THRR New THRR unchanged THRR unchanged     Progression   Progression  -  -  - Continue to progress workloads to maintain intensity without signs/symptoms of physical distress. Continue to progress workloads to maintain intensity without signs/symptoms of physical distress.     Resistance Training   Training Prescription Yes Yes Yes Yes Yes   Weight Orange bands Orange bands Orange bands orange bands orange bands   Reps 10-12 10-12 10-12 10-12 10-12  10 minutes of strength training     Interval Training   Interval Training No No No No No     Oxygen   Oxygen Continuous Continuous Continuous Continuous Continuous   Liters 1 1 1 1 1      NuStep   Level  -  - 1 2 2    Minutes  -  - 17 17 17    METs  -  - 1.7 1.6 1.7     Arm Ergometer    Level 1 1 1 1 1    Minutes 17 17 17 17 17      Track   Laps 4 7 8   - 9   Minutes 17 17 17   - 17   Row Name 07/07/16 1500 07/12/16 1543 07/14/16 1542 07/19/16 1600 07/21/16 1546     Exercise Review   Progression Yes  -  -  -  -     Response to Exercise   Blood Pressure (Admit) 104/50 110/56 95/64 90/44  104/80   Blood Pressure (Exercise) 110/60 126/60 98/60 110/60 118/62   Blood Pressure (Exit) 100/60 102/60 112/60 112/64 108/56   Heart Rate (Admit) 88 bpm 96 bpm 85 bpm 81 bpm 87 bpm   Heart Rate (Exercise) 81 bpm 91 bpm 80 bpm 84 bpm 94 bpm   Heart Rate (Exit) 76  bpm 79 bpm 75 bpm 75 bpm 85 bpm   Oxygen Saturation (Admit) 95 % 97 % 98 % 98 % 98 %   Oxygen Saturation (Exercise) 94 % 96 % 92 % 96 % 95 %   Oxygen Saturation (Exit) 99 % 98 % 99 % 97 % 98 %   Rating of Perceived Exertion (Exercise) 14 15 15 17 15    Perceived Dyspnea (Exercise) 2 2 2 2 2    Duration Progress to 45 minutes of aerobic exercise without signs/symptoms of physical distress Progress to 45 minutes of aerobic exercise without signs/symptoms of physical distress Progress to 45 minutes of aerobic exercise without signs/symptoms of physical distress Progress to 45 minutes of aerobic exercise without signs/symptoms of physical distress Progress to 45 minutes of aerobic exercise without signs/symptoms of physical distress   Intensity THRR unchanged THRR unchanged THRR unchanged THRR unchanged THRR unchanged     Progression   Progression Continue to progress workloads to maintain intensity without signs/symptoms of physical distress. Continue to progress workloads to maintain intensity without signs/symptoms of physical distress. Continue to progress workloads to maintain intensity without signs/symptoms of physical distress. Continue to progress workloads to maintain intensity without signs/symptoms of physical distress. Continue to progress workloads to maintain intensity without signs/symptoms of physical distress.      Resistance Training   Training Prescription Yes Yes Yes Yes Yes   Weight orange bands orange bands orange bands orange bands orange bands   Reps 10-12  10 minutes of strength training 10-12  10 minutes of strength training 10-12  10 minutes of strength training 10-12  10 minutes of strength training 10-12  10 minutes of strength training     Interval Training   Interval Training No No No No No     Oxygen   Oxygen Continuous Continuous Continuous Continuous Continuous   Liters 1 1 1 1 1      NuStep   Level 3 3  - 3 3   Minutes 17 17  - 17 17   METs 1.7 1.9  - 1.9 1.9     Arm Ergometer   Level  - 1 1 1 1    Minutes  - 17 17 17 17      Track   Laps 8 8 8 7   -   Minutes 17 17 17 17   -   Row Name 07/26/16 1500 07/28/16 1600 08/02/16 1500 08/04/16 1500 08/09/16 1500     Exercise Review   Progression  - Yes  - Yes  -     Response to Exercise   Blood Pressure (Admit) 102/60 120/66 98/60 112/60 92/60   Blood Pressure (Exercise) 130/64 110/70 140/70 122/62 120/58   Blood Pressure (Exit) 96/60 104/70 94/60 100/50 96/60   Heart Rate (Admit) 88 bpm 89 bpm 87 bpm 66 bpm 79 bpm   Heart Rate (Exercise) 103 bpm 83 bpm 111 bpm 107 bpm 96 bpm   Heart Rate (Exit) 83 bpm 86 bpm 77 bpm 88 bpm 84 bpm   Oxygen Saturation (Admit) 96 % 99 % 98 % 96 % 98 %   Oxygen Saturation (Exercise) 94 % 92 % 95 % 92 % 95 %   Oxygen Saturation (Exit) 97 % 98 % 99 % 98 % 100 %   Rating of Perceived Exertion (Exercise) 15 14 16 15 17    Perceived Dyspnea (Exercise) 2 1 3 2 3    Duration Progress to 45 minutes of aerobic exercise without signs/symptoms of physical distress Progress to 45  minutes of aerobic exercise without signs/symptoms of physical distress Progress to 45 minutes of aerobic exercise without signs/symptoms of physical distress Progress to 45 minutes of aerobic exercise without signs/symptoms of physical distress Progress to 45 minutes of aerobic exercise without signs/symptoms of physical distress    Intensity THRR unchanged THRR unchanged THRR unchanged THRR unchanged THRR unchanged     Progression   Progression Continue to progress workloads to maintain intensity without signs/symptoms of physical distress. Continue to progress workloads to maintain intensity without signs/symptoms of physical distress. Continue to progress workloads to maintain intensity without signs/symptoms of physical distress. Continue to progress workloads to maintain intensity without signs/symptoms of physical distress. Continue to progress workloads to maintain intensity without signs/symptoms of physical distress.     Resistance Training   Training Prescription Yes Yes Yes Yes Yes   Weight orange bands orange bands orange bands orange bands orange bands   Reps 10-12  10 minutes of strength training 10-12  10 mins of strength training 10-12  10 minutes of srength training 10-12  10 minutes of strength training 10-12  10 minutes of strength training     Interval Training   Interval Training No No No No No     Oxygen   Oxygen Continuous Continuous Continuous Continuous Continuous   Liters 1 1 1 1 1      NuStep   Level 3 4 3 4 4    Minutes 17 17 17 17 17    METs 1.9 2.3 1.9 2.4 2.2     Arm Ergometer   Level 1  - 1 1 1    Minutes 17  - 17 17 17      Track   Laps 10 11 10   - 11   Minutes 17 17 17   - 17     Home Exercise Plan   Plans to continue exercise at  -  - Home  -  -   Frequency  -  - Add 3 additional days to program exercise sessions.  -  -   Row Name 08/11/16 1600 08/16/16 1500 08/18/16 1500 08/23/16 1500 08/30/16 1500     Exercise Review   Progression  -  -  -  - Yes     Response to Exercise   Blood Pressure (Admit) 106/64 106/66 104/60 118/62 108/56   Blood Pressure (Exercise) 128/60 120/64 132/92 110/50 120/60   Blood Pressure (Exit) 110/72 64/46 112/60 112/62 100/50   Heart Rate (Admit) 83 bpm 82 bpm 82 bpm 84 bpm 90 bpm   Heart Rate (Exercise) 87 bpm 92 bpm 88 bpm 87 bpm 95 bpm    Heart Rate (Exit) 79 bpm 77 bpm 82 bpm 78 bpm 81 bpm   Oxygen Saturation (Admit) 96 % 100 % 100 % 99 % 97 %   Oxygen Saturation (Exercise) 98 % 92 % 96 % 96 % 90 %   Oxygen Saturation (Exit) 99 % 99 % 98 % 98 % 93 %   Rating of Perceived Exertion (Exercise) 16 16 16 16 17    Perceived Dyspnea (Exercise) 3 3 3 3 3    Duration Progress to 45 minutes of aerobic exercise without signs/symptoms of physical distress Progress to 45 minutes of aerobic exercise without signs/symptoms of physical distress Progress to 45 minutes of aerobic exercise without signs/symptoms of physical distress Progress to 45 minutes of aerobic exercise without signs/symptoms of physical distress Progress to 45 minutes of aerobic exercise without signs/symptoms of physical distress   Intensity THRR unchanged THRR unchanged THRR  unchanged THRR unchanged THRR unchanged     Progression   Progression Continue to progress workloads to maintain intensity without signs/symptoms of physical distress. Continue to progress workloads to maintain intensity without signs/symptoms of physical distress. Continue to progress workloads to maintain intensity without signs/symptoms of physical distress. Continue to progress workloads to maintain intensity without signs/symptoms of physical distress. Continue to progress workloads to maintain intensity without signs/symptoms of physical distress.     Resistance Training   Training Prescription Yes Yes Yes Yes Yes   Weight orange bands orange bands orange bands orange bands orange bands   Reps 10-12  10 minutes of strength training 10-12  10 minutes of strength training 10-12  10 minutes of strength training 10-12  10 minutes of strength training 10-12  10 minutes of strength training     Interval Training   Interval Training No No No No No     Oxygen   Oxygen Continuous Continuous Continuous Continuous  -   Liters 1 1 1 1   -     NuStep   Level 4 4  - 4 4   Minutes 17 17  - 17 17   METs  2.7 2.3  - 2.1 2.3     Arm Ergometer   Level 1 1 1 1 2    Minutes 17 17 17 17 17      Track   Laps  - 8 11 9 11    Minutes  - 17 17 17 17    Row Name 09/01/16 1523 09/06/16 1517 09/08/16 1500 09/15/16 1600 09/20/16 1600     Response to Exercise   Blood Pressure (Admit) 98/52 116/70 100/50 124/70 119/77   Blood Pressure (Exercise) 104/66 120/60 128/60 154/82 140/76   Blood Pressure (Exit) 92/50 104/60 102/72 104/60 100/50   Heart Rate (Admit) 81 bpm 87 bpm 80 bpm 79 bpm 82 bpm   Heart Rate (Exercise) 86 bpm 98 bpm 112 bpm 113 bpm 107 bpm   Heart Rate (Exit) 79 bpm 78 bpm 83 bpm 73 bpm 90 bpm   Oxygen Saturation (Admit) 99 % 99 % 99 % 98 % 98 %   Oxygen Saturation (Exercise) 94 % 88 % 90 % 88 % 90 %   Oxygen Saturation (Exit) 100 % 100 % 98 % 98 % 99 %   Rating of Perceived Exertion (Exercise) 16 16 14 15 16    Perceived Dyspnea (Exercise) 3 3 2 3 3    Duration Progress to 45 minutes of aerobic exercise without signs/symptoms of physical distress Progress to 45 minutes of aerobic exercise without signs/symptoms of physical distress Progress to 45 minutes of aerobic exercise without signs/symptoms of physical distress Progress to 45 minutes of aerobic exercise without signs/symptoms of physical distress Progress to 45 minutes of aerobic exercise without signs/symptoms of physical distress   Intensity THRR unchanged THRR unchanged THRR unchanged THRR unchanged THRR unchanged     Progression   Progression Continue to progress workloads to maintain intensity without signs/symptoms of physical distress. Continue to progress workloads to maintain intensity without signs/symptoms of physical distress. Continue to progress workloads to maintain intensity without signs/symptoms of physical distress. Continue to progress workloads to maintain intensity without signs/symptoms of physical distress. Continue to progress workloads to maintain intensity without signs/symptoms of physical distress.      Resistance Training   Training Prescription Yes Yes Yes Yes Yes   Weight orange bands orange bands orange bands orange bands orange bands   Reps 10-12  10 minutes of strength  training 10-12  10 minutes of strength training 10-12  10 minutes of strength training 10-12  10 minutes of strength training 10-12  10 minutes of strength training     Interval Training   Interval Training No No No No No     Oxygen   Oxygen Continuous Continuous Continuous Continuous Continuous   Liters 1 1 1 1 1      NuStep   Level  - 4  - 4 4   Minutes  - 17  - 17 17   METs  - 2.3  - 2.4 2.5     Arm Ergometer   Level 2 2  -  -  -   Minutes 17 17  -  -  -     Rower   Level  -  - 1 1 1    Minutes  -  - 17 17 17      Track   Laps 9 9 11   - 9   Minutes 17 17 17   - 17   Row Name 09/22/16 1500 09/27/16 1400 10/04/16 1500 10/11/16 1500 10/13/16 1617     Exercise Review   Progression  - Yes  -  -  -     Response to Exercise   Blood Pressure (Admit) 102/60 104/50 96/50 98/60  110/50   Blood Pressure (Exercise) 130/70 130/66 110/60 118/70 124/70   Blood Pressure (Exit) 104/70 100/50 102/60 96/56 122/60   Heart Rate (Admit) 88 bpm 80 bpm 79 bpm 78 bpm 81 bpm   Heart Rate (Exercise) 100 bpm 102 bpm 95 bpm 104 bpm 107 bpm   Heart Rate (Exit) 73 bpm 78 bpm 84 bpm 70 bpm 86 bpm   Oxygen Saturation (Admit) 97 % 98 % 97 % 98 % 98 %   Oxygen Saturation (Exercise) 88 % 91 % 94 % 95 % 100 %   Oxygen Saturation (Exit) 94 % 100 % 99 % 99 % 100 %   Rating of Perceived Exertion (Exercise) 16 16 16 17 16    Perceived Dyspnea (Exercise) 3 3 3 3 3    Duration Progress to 45 minutes of aerobic exercise without signs/symptoms of physical distress Progress to 45 minutes of aerobic exercise without signs/symptoms of physical distress Progress to 45 minutes of aerobic exercise without signs/symptoms of physical distress Progress to 45 minutes of aerobic exercise without signs/symptoms of physical distress Progress to 45 minutes  of aerobic exercise without signs/symptoms of physical distress   Intensity THRR unchanged THRR unchanged THRR unchanged THRR unchanged THRR unchanged     Progression   Progression Continue to progress workloads to maintain intensity without signs/symptoms of physical distress. Continue to progress workloads to maintain intensity without signs/symptoms of physical distress. Continue to progress workloads to maintain intensity without signs/symptoms of physical distress. Continue to progress workloads to maintain intensity without signs/symptoms of physical distress. Continue to progress workloads to maintain intensity without signs/symptoms of physical distress.     Resistance Training   Training Prescription Yes Yes Yes Yes Yes   Weight orange bands orange bands orange bands orange bands orange bands   Reps 10-12  10 minutes of strength training 10-12  10 minutes of strength training 10-12  10 minutes of strength training 10-12  10 minutes of strength training 10-12  10 minutes of strength training     Interval Training   Interval Training No No No No No     Oxygen   Oxygen Continuous Continuous Continuous Continuous Continuous   Liters 1  1 1 1 1      NuStep   Level  - 5 5 5 5    Minutes  - 17 17 17 17    METs  - 2.6 2.4 2.4 2.7     Rower   Level 1 1 1 1 1    Minutes 17 17 17 17 17      Track   Laps 9 8 9 7   -   Minutes 17 17 17 17   -   Row Name 11/03/16 1618 11/08/16 1526 11/15/16 1600 11/17/16 1500       Response to Exercise   Blood Pressure (Admit) 126/60 104/58 100/54 108/60    Blood Pressure (Exercise) 120/60 125/71 106/56 108/64    Blood Pressure (Exit) 110/64 106/54 106/60 110/68    Heart Rate (Admit) 101 bpm 101 bpm 90 bpm 99 bpm    Heart Rate (Exercise) 104 bpm 124 bpm 100 bpm 100 bpm    Heart Rate (Exit) 83 bpm 93 bpm 82 bpm 83 bpm    Oxygen Saturation (Admit) 95 % 98 % 97 % 93 %    Oxygen Saturation (Exercise) 95 % 92 % 93 % 94 %    Oxygen Saturation (Exit) 96 %  95 % 96 % 100 %    Rating of Perceived Exertion (Exercise) 15 15 15 15     Perceived Dyspnea (Exercise) 3 3 3 3     Duration Progress to 45 minutes of aerobic exercise without signs/symptoms of physical distress Progress to 45 minutes of aerobic exercise without signs/symptoms of physical distress Progress to 45 minutes of aerobic exercise without signs/symptoms of physical distress Progress to 45 minutes of aerobic exercise without signs/symptoms of physical distress    Intensity THRR unchanged THRR unchanged THRR unchanged THRR unchanged      Progression   Progression Continue to progress workloads to maintain intensity without signs/symptoms of physical distress. Continue to progress workloads to maintain intensity without signs/symptoms of physical distress. Continue to progress workloads to maintain intensity without signs/symptoms of physical distress. Continue to progress workloads to maintain intensity without signs/symptoms of physical distress.      Resistance Training   Training Prescription Yes Yes Yes Yes    Weight orange bands orange bands orange bands orange bands    Reps 10-12  10 minutes of strength training 10-12  10 minutes of strength training 10-12  10 minutes of strength training 10-12  10 minutes of strength training      Interval Training   Interval Training No No No No      Oxygen   Oxygen Continuous Continuous Continuous Continuous    Liters 1 1 1 1       NuStep   Level 5 5 5 5     Minutes 17 17 17 17     METs 2.3 2.1 2.4 2.3      Rower   Level  - 1  -  -    Minutes  - 17  -  -      Track   Laps 8 2 14 9     Minutes 17 17 17 17        Exercise Comments:     Exercise Comments    Row Name 06/30/16 0834 07/28/16 0820 08/02/16 1552 08/29/16 1201 09/06/16 1520   Exercise Comments Has attended 3 exercise sessions, too early to see progress, tolerating exercise well. Patient is slightly resistant to increasing intensity. Will continue to monitor and progress  when appropriate. Completed home exercise program Patient has had a  few workload increases. Slow to progress. Reluctant to workload increases. Will cont. to motivate. Patient is nearing graduation. Case manager and I discussed extending her rehab to 30 sessions. Patient is in agreeance.    Row Name 09/26/16 1024 10/24/16 1024 11/21/16 1643       Exercise Comments I have noticed a difference in the patient and how hard she is working each session. Patient is now doing the rower which she enjoys. Will cont. to monitor progression.  Patient has been out the last week due to illness. Will cont. to monitor and progress when the patient returns to rehab.  Patient was absent for a while in November. Still trying to recover from a long time out of rehab. Will cont. to monitor and progress.         Discharge Exercise Prescription (Final Exercise Prescription Changes):     Exercise Prescription Changes - 11/17/16 1500      Response to Exercise   Blood Pressure (Admit) 108/60   Blood Pressure (Exercise) 108/64   Blood Pressure (Exit) 110/68   Heart Rate (Admit) 99 bpm   Heart Rate (Exercise) 100 bpm   Heart Rate (Exit) 83 bpm   Oxygen Saturation (Admit) 93 %   Oxygen Saturation (Exercise) 94 %   Oxygen Saturation (Exit) 100 %   Rating of Perceived Exertion (Exercise) 15   Perceived Dyspnea (Exercise) 3   Duration Progress to 45 minutes of aerobic exercise without signs/symptoms of physical distress   Intensity THRR unchanged     Progression   Progression Continue to progress workloads to maintain intensity without signs/symptoms of physical distress.     Resistance Training   Training Prescription Yes   Weight orange bands   Reps 10-12  10 minutes of strength training     Interval Training   Interval Training No     Oxygen   Oxygen Continuous   Liters 1     NuStep   Level 5   Minutes 17   METs 2.3     Track   Laps 9   Minutes 17       Nutrition:  Target Goals:  Understanding of nutrition guidelines, daily intake of sodium 1500mg , cholesterol 200mg , calories 30% from fat and 7% or less from saturated fats, daily to have 5 or more servings of fruits and vegetables.  Biometrics:     Pre Biometrics - 06/10/16 0833      Pre Biometrics   Grip Strength 22 kg       Nutrition Therapy Plan and Nutrition Goals:     Nutrition Therapy & Goals - 07/19/16 1534      Nutrition Therapy   Diet Low Sodium     Personal Nutrition Goals   Personal Goal #1 Prevent further weight loss/maintain current weight while in Pulmonary Rehab     Intervention Plan   Intervention Prescribe, educate and counsel regarding individualized specific dietary modifications aiming towards targeted core components such as weight, hypertension, lipid management, diabetes, heart failure and other comorbidities.   Expected Outcomes Short Term Goal: Understand basic principles of dietary content, such as calories, fat, sodium, cholesterol and nutrients.;Long Term Goal: Adherence to prescribed nutrition plan.      Nutrition Discharge: Rate Your Plate Scores:     Nutrition Assessments - 07/18/16 1541      Rate Your Plate Scores   Pre Score 57      Psychosocial: Target Goals: Acknowledge presence or absence of depression, maximize coping skills, provide positive support  system. Participant is able to verbalize types and ability to use techniques and skills needed for reducing stress and depression.  Initial Review & Psychosocial Screening:     Initial Psych Review & Screening - 06/03/16 1623      Family Dynamics   Good Support System? Yes     Screening Interventions   Interventions Encouraged to exercise      Quality of Life Scores:     Quality of Life - 06/23/16 1344      Quality of Life Scores   Health/Function Pre 7.39 %   Socioeconomic Pre 11.56 %   Psych/Spiritual Pre 6.14 %   Family Pre 26 %   GLOBAL Pre 9.91 %      PHQ-9: Recent Review Flowsheet  Data    Depression screen [facility] 2/9 06/03/2016 03/13/2016   Decreased Interest 1 0   Down, Depressed, Hopeless 1 0   PHQ - 2 Score 2 0   Altered sleeping 0 -   Tired, decreased energy 1 -   Change in appetite 0 -   Feeling bad or failure about yourself  1 -   Trouble concentrating 0 -   Moving slowly or fidgety/restless 0 -   Suicidal thoughts 0 -   PHQ-9 Score 4 -   Difficult doing work/chores Not difficult at all -      Psychosocial Evaluation and Intervention:   Psychosocial Re-Evaluation:     Psychosocial Re-Evaluation    Row Name 06/27/16 1141 07/25/16 1548 08/25/16 0839 09/26/16 0904 10/17/16 1236     Psychosocial Re-Evaluation   Interventions Encouraged to attend Pulmonary Rehabilitation for the exercise  - Encouraged to attend Pulmonary Rehabilitation for the exercise Encouraged to attend Pulmonary Rehabilitation for the exercise Encouraged to attend Pulmonary Rehabilitation for the exercise   Comments No psychosocial concerns identified at this time. -  no concerns identified Good support system No psychosocial issues identified. No psychosocial issues identified at this time   Continued Psychosocial Services Needed No No No No No   Row Name 11/17/16 0955             Psychosocial Re-Evaluation   Interventions Encouraged to attend Pulmonary Rehabilitation for the exercise       Comments No psychosocial issues identified.         Continued Psychosocial Services Needed No         Education: Education Goals: Education classes will be provided on a weekly basis, covering required topics. Participant will state understanding/return demonstration of topics presented.  Learning Barriers/Preferences:     Learning Barriers/Preferences - 06/03/16 1620      Learning Barriers/Preferences   Learning Barriers None   Learning Preferences Group Instruction;Individual Instruction;Written Material      Education Topics: Risk Factor Reduction:  -Group instruction that is  supported by a PowerPoint presentation. Instructor discusses the definition of a risk factor, different risk factors for pulmonary disease, and how the heart and lungs work together.     Nutrition for Pulmonary Patient:  -Group instruction provided by PowerPoint slides, verbal discussion, and written materials to support subject matter. The instructor gives an explanation and review of healthy diet recommendations, which includes a discussion on weight management, recommendations for fruit and vegetable consumption, as well as protein, fluid, caffeine, fiber, sodium, sugar, and alcohol. Tips for eating when patients are short of breath are discussed. Flowsheet Row PULMONARY REHAB OTHER RESPIRATORY from 11/17/2016 in [facility] CARDIAC REHAB  Date  07/28/16  Educator  RD  Instruction Review Code  2- meets goals/outcomes      Pursed Lip Breathing:  -Group instruction that is supported by demonstration and informational handouts. Instructor discusses the benefits of pursed lip and diaphragmatic breathing and detailed demonstration on how to preform both.   Flowsheet Row PULMONARY REHAB OTHER RESPIRATORY from 11/17/2016 in [facility] CARDIAC REHAB  Date  07/14/16  Educator  EP  Instruction Review Code  2- meets goals/outcomes      Oxygen Safety:  -Group instruction provided by PowerPoint, verbal discussion, and written material to support subject matter. There is an overview of "What is Oxygen" and "Why do we need it".  Instructor also reviews how to create a safe environment for oxygen use, the importance of using oxygen as prescribed, and the risks of noncompliance. There is a brief discussion on traveling with oxygen and resources the patient may utilize. Flowsheet Row PULMONARY REHAB OTHER RESPIRATORY from 11/17/2016 in [facility] CARDIAC REHAB  Date  08/11/16  Educator  rn  Instruction Review Code  2- meets goals/outcomes       Oxygen Equipment:  -Group instruction provided by [employer] Staff utilizing handouts, written materials, and equipment demonstrations. Flowsheet Row PULMONARY REHAB OTHER RESPIRATORY from 11/17/2016 in [facility] CARDIAC REHAB  Date  11/03/16  Educator  Rep      Signs and Symptoms:  -Group instruction provided by written material and verbal discussion to support subject matter. Warning signs and symptoms of infection, stroke, and heart attack are reviewed and when to call the physician/911 reinforced. Tips for preventing the spread of infection discussed. Flowsheet Row PULMONARY REHAB OTHER RESPIRATORY from 11/17/2016 in [facility] CARDIAC REHAB  Date  09/15/16  Educator  RN  Instruction Review Code  2- meets goals/outcomes      Advanced Directives:  -Group instruction provided by verbal instruction and written material to support subject matter. Instructor reviews Advanced Directive laws and proper instruction for filling out document.   Pulmonary Video:  -Group video education that reviews the importance of medication and oxygen compliance, exercise, good nutrition, pulmonary hygiene, and pursed lip and diaphragmatic breathing for the pulmonary patient. Flowsheet Row PULMONARY REHAB OTHER RESPIRATORY from 11/17/2016 in [facility] CARDIAC REHAB  Date  08/18/16  Instruction Review Code  2- meets goals/outcomes      Exercise for the Pulmonary Patient:  -Group instruction that is supported by a PowerPoint presentation. Instructor discusses benefits of exercise, core components of exercise, frequency, duration, and intensity of an exercise routine, importance of utilizing pulse oximetry during exercise, safety while exercising, and options of places to exercise outside of rehab.   Flowsheet Row PULMONARY REHAB OTHER RESPIRATORY from 11/17/2016 in [facility] CARDIAC REHAB  Date  11/17/16  Educator  EP   Instruction Review Code  2- meets goals/outcomes      Pulmonary Medications:  -Verbally interactive group education provided by instructor with focus on inhaled medications and proper administration.   Anatomy and Physiology of the Respiratory System and Intimacy:  -Group instruction provided by PowerPoint, verbal discussion, and written material to support subject matter. Instructor reviews respiratory cycle and anatomical components of the respiratory system and their functions. Instructor also reviews differences in obstructive and restrictive respiratory diseases with examples of each. Intimacy, Sex, and Sexuality differences are reviewed with a discussion on how relationships can change when diagnosed with pulmonary disease. Common sexual concerns are reviewed. Flowsheet Row PULMONARY REHAB OTHER RESPIRATORY  from 11/17/2016 in [facility] CARDIAC REHAB  Date  07/07/16  Educator  RN  Instruction Review Code  2- meets goals/outcomes      Knowledge Questionnaire Score:     Knowledge Questionnaire Score - 06/23/16 1343      Knowledge Questionnaire Score   Pre Score 12/13      Core Components/Risk Factors/Patient Goals at Admission:     Personal Goals and Risk Factors at Admission - 06/03/16 1621      Core Components/Risk Factors/Patient Goals on Admission   Increase Strength and Stamina Yes   Intervention Provide advice, education, support and counseling about physical activity/exercise needs.;Develop an individualized exercise prescription for aerobic and resistive training based on initial evaluation findings, risk stratification, comorbidities and participant's personal goals.   Expected Outcomes Achievement of increased cardiorespiratory fitness and enhanced flexibility, muscular endurance and strength shown through measurements of functional capacity and personal statement of participant.   Improve shortness of breath with ADL's Yes   Intervention Provide  education, individualized exercise plan and daily activity instruction to help decrease symptoms of SOB with activities of daily living.   Expected Outcomes Short Term: Achieves a reduction of symptoms when performing activities of daily living.   Develop more efficient breathing techniques such as purse lipped breathing and diaphragmatic breathing; and practicing self-pacing with activity Yes   Intervention Provide education, demonstration and support about specific breathing techniuqes utilized for more efficient breathing. Include techniques such as pursed lipped breathing, diaphragmatic breathing and self-pacing activity.   Expected Outcomes Short Term: Participant will be able to demonstrate and use breathing techniques as needed throughout daily activities.   Increase knowledge of respiratory medications and ability to use respiratory devices properly  Yes   Intervention Provide education and demonstration as needed of appropriate use of medications, inhalers, and oxygen therapy.   Expected Outcomes Short Term: Achieves understanding of medications use. Understands that oxygen is a medication prescribed by physician. Demonstrates appropriate use of inhaler and oxygen therapy.      Core Components/Risk Factors/Patient Goals Review:      Goals and Risk Factor Review    Row Name 06/27/16 1136 07/25/16 1545 08/25/16 0836 09/26/16 0903 10/17/16 1224     Core Components/Risk Factors/Patient Goals Review   Personal Goals Review Increase Strength and Stamina;Improve shortness of breath with ADL's;Develop more efficient breathing techniques such as purse lipped breathing and diaphragmatic breathing and practicing self-pacing with activity.  - Increase Strength and Stamina;Improve shortness of breath with ADL's;Develop more efficient breathing techniques such as purse lipped breathing and diaphragmatic breathing and practicing self-pacing with activity. Increase Strength and Stamina;Improve shortness  of breath with ADL's;Develop more efficient breathing techniques such as purse lipped breathing and diaphragmatic breathing and practicing self-pacing with activity.;Increase knowledge of respiratory medications and ability to use respiratory devices properly. Develop more efficient breathing techniques such as purse lipped breathing and diaphragmatic breathing and practicing self-pacing with activity.;Improve shortness of breath with ADL's;Increase Strength and Stamina   Review Has only attended less than 5 exercise sessions, it is too early to see improvement in strength and stamina has attended 10 exercise sessions, slowly increasing her workloads, still no improvement with in strength and stamina.  She is very weak has has many "bad" days where she is very tired Has attended 19 exercise sessions, not progressing, very dificult to progress due to weakness and not feeling well Has been extended to 30 exercise sessions due to slow progression., is progressing albeit slow. Has been extended  to 36 exercise sessions due to slow progression, Is using a rower and increased the nustep to level 5, and walking 8-9 laps on track   Expected Outcomes She should should see a noticeable improvement in the next 30 days. Hopefully will see an improvement in stamina in next 30 days. Has made very slow and minimal progress, resistance to increas workloads due to feeling weak She is fragile, progress slowly  -   Row Name 11/17/16 0951             Core Components/Risk Factors/Patient Goals Review   Personal Goals Review Develop more efficient breathing techniques such as purse lipped breathing and diaphragmatic breathing and practicing self-pacing with activity.;Improve shortness of breath with ADL's;Increase Strength and Stamina       Review Has 3 more exercise sessions, slow to progress, but her attendance is very regular and she does all she can tolerate.  Exercise has improved her strength, stamina, and given her a  knowledge base that is greater than when she started the program.         Expected Outcomes expect her to continue exercising on her own after graduation.            Core Components/Risk Factors/Patient Goals at Discharge (Final Review):      Goals and Risk Factor Review - 11/17/16 0951      Core Components/Risk Factors/Patient Goals Review   Personal Goals Review Develop more efficient breathing techniques such as purse lipped breathing and diaphragmatic breathing and practicing self-pacing with activity.;Improve shortness of breath with ADL's;Increase Strength and Stamina   Review Has 3 more exercise sessions, slow to progress, but her attendance is very regular and she does all she can tolerate.  Exercise has improved her strength, stamina, and given her a knowledge base that is greater than when she started the program.     Expected Outcomes expect her to continue exercising on her own after graduation.        ITP Comments:   Comments: ITP REVIEW Pt is making expected progress toward pulmonary rehab goals after completing 34 sessions. Recommend continued exercise, life style modification, education, and utilization of breathing techniques to increase stamina and strength and decrease shortness of breath with exertion.

## 2016-11-22 NOTE — Progress Notes (Signed)
Daily Session Note  Patient Details  Name: Julie Page MRN: [id number] Date of Birth: 1951-09-29 Referring Provider:   [name] Pulmonary Rehab Walk Test from 06/09/2016 in [place]  Referring Provider  Dr. Chase Caller      Encounter Date: 11/22/2016  Check In:     Session Check In - 11/22/16 1356      Check-In   Location [facility]   Staff Present Trish Fountain, RN, Maxcine Ham, RN, BSN;[name] diVincenzo, MS, ACSM RCEP, Exercise Physiologist;Lisa Ysidro Evert, RN   Supervising physician immediately available to respond to emergencies [facility] immediately available   Physician(s) Dr. Ree Kida   Medication changes reported     No   Fall or balance concerns reported    No   Warm-up and Cool-down Performed as group-led instruction   Resistance Training Performed Yes   VAD Patient? No     Pain Assessment   Currently in Pain? No/denies   Multiple Pain Sites No      Capillary Blood Glucose: No results found for this or any previous visit (from the past 24 hour(s)).      Exercise Prescription Changes - 11/22/16 1601      Response to Exercise   Blood Pressure (Admit) 100/56   Blood Pressure (Exercise) 112/60   Blood Pressure (Exit) 100/60   Heart Rate (Admit) 82 bpm   Heart Rate (Exercise) 108 bpm   Heart Rate (Exit) 83 bpm   Oxygen Saturation (Admit) 98 %   Oxygen Saturation (Exercise) 89 %   Oxygen Saturation (Exit) 99 %   Rating of Perceived Exertion (Exercise) 15   Perceived Dyspnea (Exercise) 3   Duration Progress to 45 minutes of aerobic exercise without signs/symptoms of physical distress   Intensity THRR unchanged     Progression   Progression Continue to progress workloads to maintain intensity without signs/symptoms of physical distress.     Resistance Training   Training Prescription Yes   Weight orange bands   Reps 10-12  10 minutes of strength training     Interval Training   Interval  Training No     Oxygen   Oxygen Continuous   Liters 1     NuStep   Level 5   Minutes 17   METs 2.5     Track   Laps 14   Minutes 34     Goals Met:  Improved SOB with ADL's Using PLB without cueing & demonstrates good technique Exercise tolerated well No report of cardiac concerns or symptoms Strength training completed today  Goals Unmet:  Not Applicable  Comments: Service time is from 1330 to 1515   Dr. Rush Farmer is Medical Director for Pulmonary Rehab at [facility].

## 2016-11-23 ENCOUNTER — Ambulatory Visit (INDEPENDENT_AMBULATORY_CARE_PROVIDER_SITE_OTHER): Payer: Medicare Other | Admitting: Internal Medicine

## 2016-11-23 ENCOUNTER — Encounter: Payer: Self-pay | Admitting: Internal Medicine

## 2016-11-23 VITALS — BP 116/78 | HR 70 | Ht 60.0 in | Wt 120.0 lb

## 2016-11-23 DIAGNOSIS — J84112 Idiopathic pulmonary fibrosis: Secondary | ICD-10-CM | POA: Diagnosis not present

## 2016-11-23 DIAGNOSIS — M9901 Segmental and somatic dysfunction of cervical region: Secondary | ICD-10-CM | POA: Diagnosis not present

## 2016-11-23 DIAGNOSIS — M40292 Other kyphosis, cervical region: Secondary | ICD-10-CM | POA: Diagnosis not present

## 2016-11-23 DIAGNOSIS — J9611 Chronic respiratory failure with hypoxia: Secondary | ICD-10-CM | POA: Diagnosis not present

## 2016-11-23 DIAGNOSIS — M542 Cervicalgia: Secondary | ICD-10-CM | POA: Diagnosis not present

## 2016-11-23 DIAGNOSIS — M4712 Other spondylosis with myelopathy, cervical region: Secondary | ICD-10-CM | POA: Diagnosis not present

## 2016-11-23 LAB — PULMONARY FUNCTION TEST
DL/VA % PRED: 24 %
DL/VA: 1.03 ml/min/mmHg/L
DLCO COR % PRED: 8 %
DLCO COR: 1.67 ml/min/mmHg
DLCO UNC % PRED: 8 %
DLCO unc: 1.67 ml/min/mmHg
FEF 25-75 Pre: 2.37 L/sec
FEF2575-%Pred-Pre: 125 %
FEV1-%Pred-Pre: 63 %
FEV1-Pre: 1.28 L
FEV1FVC-%PRED-PRE: 123 %
FEV6-%Pred-Pre: 52 %
FEV6-Pre: 1.35 L
FEV6FVC-%Pred-Pre: 104 %
FVC-%PRED-PRE: 50 %
FVC-Pre: 1.35 L
PRE FEV6/FVC RATIO: 100 %
Pre FEV1/FVC ratio: 95 %

## 2016-11-23 NOTE — Progress Notes (Signed)
pft  

## 2016-11-23 NOTE — Patient Instructions (Addendum)
ICD-9-CM ICD-10-CM   1. IPF (idiopathic pulmonary fibrosis) (HCC) 516.31 J84.112   2. Chronic hypoxemic respiratory failure (HCC) 518.83 J96.11    799.02      IPF stable v mild decline No evidence of pulmonary hypertension based on sept 2017 right heart cath  plan Respect your desire to avoid anti-fibrotic therapy Respect your decision to reject flu shot Continue oxygen and pulmonary rehabilitation   [facility] a way to have a true POC system and a overnight system that suits your desires Respect desire not to be part of any research study    Followup 6 months or sooner if needed  - will monitor diseae clinically basedon o2 need and symptoms because our goal is supportive care

## 2016-11-23 NOTE — Progress Notes (Signed)
Subjective:     Patient ID: Julie Page, female   DOB: 03/06/1951, 65 y.o.   MRN: [id number]  HPI    OV 04/28/2016  Chief Complaint  Patient presents with  . Follow-up    Pt here for HFU for pna and ILD. Pt states she is still occassionally having DOE, prod cough with clear mucus. Pt denies CP/tightness.    65 [facility] of interstitial lung disease. History is gained from talking to her, her son review of the outside chart and old chart.  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 This building has mold in it and on and off ever since she started working that she started having respirations symptoms. In 2012 and 2013 she was evaluated by Dr. Levy Pupaobert Byrum in our institution. 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 There is also history of [name] and rheumatoid factor antibody. At that time pulmonary function test showed isolated low reduction in diffusion capacity that was mild. She only had dyspnea on climbing stairs at that point in time. She continued to be in stable health up until spring of 2017. During this time she continued to work in the building. In the interim she did follow-up with [facility] Zenovia JordanAngela Hawkes [facility]. Pertinent lab tests are listed below. No systemic evidence of autoimmune disease was discovered and she was on serial follow-up.  Then most recently in April 2017 she had 2 weeks of respiratory exacerbation and then hospitalized for 1 week. CT scan of the chest at this point in time shows classic UIP pattern associated with diffuse groundglass opacity consistent with UIP flare. Significant progression since previous CT scan in 2012. She was discharged on steroid taper which she still is taking and 1 L oxygen. She is extremely physically  deconditioned. She spent some time in inpatient rehabilitation. She is now at home and is going to start home physical therapy. She notices that at room air at rest she would be 92% can be off oxygen for a few hours when she exerts she can drop into the 80s.  Echocardiogram at this point in time shows normal ejection fraction but elevated right ventricular and pulmonary artery systolic pressure to 40 mmHg.  She and her son have many questions pertaining to etiology, prognosis, treatment and outlook and disability application.    Autoimmune labs - Rheumatoid factor s 24 and in April 2013 was 59 in our health system. But with [facility] Nickola [facility]Haw[facility] was 6689 and 102, in 2014 in 2015 respectively - [name] was positive at 1 in April 2017 in our health system and was trace positive at 2.0 with Dr. Nickola MajorHawkes in September 2014. - No evidence that she's had CCP antibody tested for hypersensitivity panel tested -In September 2014 with Dr. Louann SjogrenHawkes ANA was trace positive but negative [name] scleroderma 70 SSB and double-stranded DNA antibodies.. Similar result in April 2017.  Waking desat test 185 feet x 3 laps on RA: desat to 86% in 1/2 to 1 lap and stopped    OV 05/24/2016  Chief Complaint  Patient presents with  . Follow-up    pt has not yet had R heart cath, just heard from cards yesterday.  pt has no complaints today.      Follow-up interstitial lung disease  provisional diagnosis of IPF. Presents with her son Julie Page discuss pulmonary function tests and autoimmune results  Last visit sent  CCP antibody and hypersensitivity pneumonitis profile and these were negative. We did pulmonary function test. She could not do her DLCO but FVC is 54% and a moderate severity. Currently she feels physically stronger but still very deconditioned from her admission. She is able to time 1 flight of stairs 1 L oxygen and desaturates to 88% especially when she coughs. Cough is still a problem for which  she is on palliative support with Tessalon. She has now got a call from pulmonary rehabilitation and she plans to start that. She is working with cardiology to get an appointment to see [name] for right heart cath. She has not decided about anti-fibrotic therapy. She is very afraid of the side effects. She says she is very sensitive. She is worried about [name] because of sunscreen and she is worried about [name] because of potential cardiac risk. She feels that she wants to get stronger before she can try the medications. Although I cautioned her that IPF is unpredictable and can progress aggressively especially in the first after flare up. Her son Julie Page is in favor of taking her medications but she still not sure we discussed this extensively. She wants to consult with her other son   OV 08/31/2016  Chief Complaint  Patient presents with  . Follow-up    Pt states her breathing is doing well. Pt c/o prod cough with clear mucus. Pt denies CP/tightness and f/c/s.      Follow-up idiopathic pulmonary fibrosis diagnoses made June 2017  This is a routine follow-up. She is now on pulmonary rehabilitation. She uses 1 L of oxygen. Conditioning has improved. Shortness of breath has improved. But she is unable to go off oxygen. She continues to recheck anti-fibrotic therapy. In fact today she also rejected flu shot. In general she is averse to taking medications because of side effect profile. She will have her right heart catheterization on 09/12/2016. Her main symptomatic issue is cough that is occasional but very severe when it happens. She gets fatigue. This unpredictability to its onset. She has codeine cough syrup that helps but she is reluctant to take. Because of side effects. She's not interested in other options such as treatment of sinus drainage or vocal cord irritation or acid reflux therapy or over-the-counter Phenergan cough syrup   OV 11/23/2016  Chief Complaint  Patient  presents with  . Follow-up    Go over PFT results and 2 month follow up. Breathing has been of for the past few months.    Frail 65 year old female clinical diagnosis of IPF made May/June 2017. Does not desire for anti-fibrotic therapy or flu shot or research protocols  Overall she feels improved. She had a right heart catheterization September 2017 as documented below and it is normal. She uses 1 L of oxygen. This is at rest. She is not doing ADLs and was able to drive here. She is not sitting on the wheelchair anymore. Nevertheless overall she is fatigued and frail. Her main concern today is that her oxygen portable system she wants a larger tank and a smaller tank. She is worried that she might only have one option. She is declined a flu shot. She is declined participating in IPF registry.  Heart Pressures RHC Procedural Findings: Hemodynamics (mmHg) RA mean 1 RV 31/1 PA 31/8, mean 18 PCWP 6  Oxygen saturations: PA 79% AO 98%  Cardiac Output (Fick) 6.28  Cardiac Index (Fick) 4.19     Results for Julie Page, Julie Page (MRN [id number]) as of 11/23/2016 16:26  Ref. Range 05/12/2016 13:56 11/23/2016 14:43  FVC-Pre Latest Units: L 1.46 1.35  FVC-%Pred-Pre Latest Units: % 54 50  Results for Julie Page, Julie Page (MRN [id number]) as of 11/23/2016 16:26  Ref. Range 05/12/2016 13:56 11/23/2016 14:43  DLCO unc Latest Units: ml/min/mmHg -0.38 1.67  DLCO unc % pred Latest Units: % -2 8     has a past medical history of Acute respiratory failure with hypoxia (HCC); Anemia; CAP (community acquired pneumonia); Chronic diastolic heart failure (HCC); Hypercholesteremia; Hypoxia; ILD (interstitial lung disease) (HCC); Leukocytosis; Malnutrition of moderate degree (HCC); Palpitations; Protein calorie malnutrition (HCC); Pulmonary HTN; Raynaud's syndrome; and SIRS (systemic inflammatory response syndrome) (HCC).   reports that she has never smoked. She has never used smokeless tobacco.  Past Surgical History:   Procedure Laterality Date  . CARDIAC CATHETERIZATION N/A 09/12/2016   Procedure: Right Heart Cath;  Surgeon: Laurey Morale, MD;  Location: [facility] INVASIVE CV LAB;  Service: Cardiovascular;  Laterality: N/A;  . TUBAL LIGATION  03/30/1978    Allergies  Allergen Reactions  . Purell Instant Hand [Alcohol] Cough  . Azithromycin Other (See Comments)    Burning and itching at IV infusion site  . Spiriva Handihaler [Tiotropium Bromide Monohydrate] Other (See Comments)    "Made me crazy"    Immunization History  Administered Date(s) Administered  . PPD Test 03/31/2016    Family History  Problem Relation Age of Onset  . Heart disease Mother   . Heart disease Maternal Grandfather   . Heart disease Father      Current Outpatient Prescriptions:  .  cholecalciferol (VITAMIN D) 400 units TABS tablet, Take 400 Units by mouth daily., Disp: , Rfl:  .  Cholecalciferol (VITAMIN D3 PO), Take 100 Units by mouth daily., Disp: , Rfl:  .  Digestive Enzymes (DIGESTIVE ENZYME PO), Take 1 tablet by mouth 3 (three) times daily. , Disp: , Rfl:  .  Magnesium 100 MG CAPS, Take 400 mg by mouth every evening., Disp: , Rfl:  .  Omega-3 Fatty Acids (FISH OIL PO), Take 1,500 mg by mouth daily., Disp: , Rfl:  .  OVER THE COUNTER MEDICATION, Take 2 tablets by mouth daily. Serra Peptase protein Digestive Enzyme supplement, Disp: , Rfl:  .  TURMERIC CURCUMIN PO, Take 1,100 mg by mouth daily as needed. Inflammation, Disp: , Rfl:  .  vitamin C (ASCORBIC ACID) 500 MG tablet, Take 500 mg by mouth daily., Disp: , Rfl:    Review of Systems     Objective:   Physical Exam  Constitutional: She is oriented to person, place, and time. She appears well-nourished. No distress.  Frail female  HENT:  Head: Normocephalic and atraumatic.  Right Ear: External ear normal.  Left Ear: External ear normal.  Mouth/Throat: Oropharynx is clear and moist. No oropharyngeal exudate.  Oxygen on  Eyes: Conjunctivae and EOM are normal.  Pupils are equal, round, and reactive to light. Right eye exhibits no discharge. Left eye exhibits no discharge. No scleral icterus.  Neck: Normal range of motion. Neck supple. No JVD present. No tracheal deviation present. No thyromegaly present.  Cardiovascular: Normal rate, regular rhythm, normal heart sounds and intact distal pulses.  Exam reveals no gallop and no friction rub.   No murmur heard. Pulmonary/Chest: Effort normal. No respiratory distress. She has no wheezes. She has rales. She exhibits no tenderness.  Abdominal: Soft. Bowel sounds are normal. She exhibits no distension and no mass. There is no tenderness. There is no rebound and no guarding.  Musculoskeletal: Normal range of motion. She exhibits no edema or tenderness.  No clubbing  Lymphadenopathy:    She has no cervical adenopathy.  Neurological: She is alert and oriented to person, place, and time. She has normal reflexes. No cranial nerve deficit. She exhibits normal muscle tone. Coordination normal.  Skin: Skin is warm and dry. No rash noted. She is not diaphoretic. No erythema. No pallor.  Psychiatric: Judgment normal.  Flat affect  Vitals reviewed.  Vitals:   11/23/16 1607  BP: 116/78  Pulse: 70  SpO2: 94%  Weight: 120 lb (54.4 kg)  Height: 5' (1.524 m)    Estimated body mass index is 23.44 kg/m as calculated from the following:   Height as of this encounter: 5' (1.524 m).   Weight as of this encounter: 120 lb (54.4 kg).      Assessment:       ICD-9-CM ICD-10-CM   1. IPF (idiopathic pulmonary fibrosis) (HCC) 516.31 J84.112   2. Chronic hypoxemic respiratory failure (HCC) 518.83 J96.11    799.02         Plan:      IPF stable v mild decline No evidence of pulmonary hypertension based on sept 2017 right heart cath  plan Respect your desire to avoid anti-fibrotic therapy Respect your decision to reject flu shot Continue oxygen 1L [place] at rest and pulmonary rehabilitation   - meet with [facility] to  figure out a way to have a true POC system and a overnight system that suits your desires Respect desire not to be part of any research study    Followup 6 months or sooner if needed  - will monitor diseae clinically basedon o2 need and symptoms because our goal is supportive care   Dr. Kalman Shan, M.D., [facility].C.P Pulmonary and Critical Care Medicine Staff Physician [facility] [place] Pulmonary and Critical Care Pager: [phone], If no answer or between  15:00h - 7:00h: call [phone]  11/23/2016 4:41 PM

## 2016-11-23 NOTE — Addendum Note (Signed)
Addended by: Jaynee EaglesLEMONS, LINDSAY C on: 11/23/2016 04:50 PM   Modules accepted: Orders

## 2016-11-23 NOTE — Addendum Note (Signed)
Addended by: Jaynee EaglesLEMONS, Seyon Strader C on: 11/23/2016 04:43 PM   Modules accepted: Orders

## 2016-11-24 ENCOUNTER — Encounter (HOSPITAL_COMMUNITY)
Admission: RE | Admit: 2016-11-24 | Discharge: 2016-11-24 | Disposition: A | Discharge status: Home / Self Care | Payer: Medicare Other | Source: Ambulatory Visit | Attending: Internal Medicine | Admitting: Internal Medicine

## 2016-11-24 DIAGNOSIS — M9901 Segmental and somatic dysfunction of cervical region: Secondary | ICD-10-CM | POA: Diagnosis not present

## 2016-11-24 DIAGNOSIS — E7800 Pure hypercholesterolemia, unspecified: Secondary | ICD-10-CM | POA: Diagnosis not present

## 2016-11-24 DIAGNOSIS — M542 Cervicalgia: Secondary | ICD-10-CM | POA: Diagnosis not present

## 2016-11-24 DIAGNOSIS — J849 Interstitial pulmonary disease, unspecified: Secondary | ICD-10-CM | POA: Diagnosis not present

## 2016-11-24 DIAGNOSIS — M40292 Other kyphosis, cervical region: Secondary | ICD-10-CM | POA: Diagnosis not present

## 2016-11-24 DIAGNOSIS — M4712 Other spondylosis with myelopathy, cervical region: Secondary | ICD-10-CM | POA: Diagnosis not present

## 2016-11-24 DIAGNOSIS — I7300 Raynaud's syndrome without gangrene: Secondary | ICD-10-CM | POA: Diagnosis not present

## 2016-11-24 DIAGNOSIS — Z79899 Other long term (current) drug therapy: Secondary | ICD-10-CM | POA: Diagnosis not present

## 2016-11-24 DIAGNOSIS — Z7982 Long term (current) use of aspirin: Secondary | ICD-10-CM | POA: Diagnosis not present

## 2016-11-24 DIAGNOSIS — I5032 Chronic diastolic (congestive) heart failure: Secondary | ICD-10-CM | POA: Diagnosis not present

## 2016-11-29 ENCOUNTER — Other Ambulatory Visit: Payer: Self-pay

## 2016-11-29 ENCOUNTER — Telehealth: Payer: Self-pay

## 2016-11-29 ENCOUNTER — Encounter (HOSPITAL_COMMUNITY): Payer: Medicare Other

## 2016-11-29 DIAGNOSIS — M542 Cervicalgia: Secondary | ICD-10-CM | POA: Diagnosis not present

## 2016-11-29 DIAGNOSIS — M40292 Other kyphosis, cervical region: Secondary | ICD-10-CM | POA: Diagnosis not present

## 2016-11-29 DIAGNOSIS — M4712 Other spondylosis with myelopathy, cervical region: Secondary | ICD-10-CM | POA: Diagnosis not present

## 2016-11-29 DIAGNOSIS — M9901 Segmental and somatic dysfunction of cervical region: Secondary | ICD-10-CM | POA: Diagnosis not present

## 2016-11-29 DIAGNOSIS — J84112 Idiopathic pulmonary fibrosis: Secondary | ICD-10-CM

## 2016-11-29 NOTE — Telephone Encounter (Signed)
MR  Please advise  Melina CopaLisa Hughes from Pulmonary rehab program called and stated the pt. Would like to move from the undergrad pulmonary rehab  to the maintenance program. Will this be ok to place order

## 2016-11-29 NOTE — Telephone Encounter (Signed)
Yes go ahead  Dr. Kalman ShanMurali Esaw Knippel, M.D., [facility].C.P Pulmonary and Critical Care Medicine Staff Physician [facility] [place] Pulmonary and Critical Care Pager: [phone], If no answer or between  15:00h - 7:00h: call [phone]  11/29/2016 3:41 PM

## 2016-11-29 NOTE — Telephone Encounter (Signed)
Order placed but it is after hours to call Lisa back. Left message in Triage to call her back tomorrow in the am. [phone].   Melina CopaLisa Hughes

## 2016-11-30 DIAGNOSIS — M40292 Other kyphosis, cervical region: Secondary | ICD-10-CM | POA: Diagnosis not present

## 2016-11-30 DIAGNOSIS — M4712 Other spondylosis with myelopathy, cervical region: Secondary | ICD-10-CM | POA: Diagnosis not present

## 2016-11-30 DIAGNOSIS — M542 Cervicalgia: Secondary | ICD-10-CM | POA: Diagnosis not present

## 2016-11-30 DIAGNOSIS — M9901 Segmental and somatic dysfunction of cervical region: Secondary | ICD-10-CM | POA: Diagnosis not present

## 2016-11-30 NOTE — Telephone Encounter (Signed)
Since order req placed I am closing this encounter

## 2016-12-01 ENCOUNTER — Encounter (HOSPITAL_COMMUNITY): Payer: Medicare Other

## 2016-12-01 DIAGNOSIS — M40292 Other kyphosis, cervical region: Secondary | ICD-10-CM | POA: Diagnosis not present

## 2016-12-01 DIAGNOSIS — M542 Cervicalgia: Secondary | ICD-10-CM | POA: Diagnosis not present

## 2016-12-01 DIAGNOSIS — M9901 Segmental and somatic dysfunction of cervical region: Secondary | ICD-10-CM | POA: Diagnosis not present

## 2016-12-01 DIAGNOSIS — M4712 Other spondylosis with myelopathy, cervical region: Secondary | ICD-10-CM | POA: Diagnosis not present

## 2016-12-06 ENCOUNTER — Encounter (HOSPITAL_COMMUNITY): Payer: Medicare Other

## 2016-12-06 DIAGNOSIS — M4712 Other spondylosis with myelopathy, cervical region: Secondary | ICD-10-CM | POA: Diagnosis not present

## 2016-12-06 DIAGNOSIS — M40292 Other kyphosis, cervical region: Secondary | ICD-10-CM | POA: Diagnosis not present

## 2016-12-06 DIAGNOSIS — M9901 Segmental and somatic dysfunction of cervical region: Secondary | ICD-10-CM | POA: Diagnosis not present

## 2016-12-06 DIAGNOSIS — M542 Cervicalgia: Secondary | ICD-10-CM | POA: Diagnosis not present

## 2016-12-07 DIAGNOSIS — M9901 Segmental and somatic dysfunction of cervical region: Secondary | ICD-10-CM | POA: Diagnosis not present

## 2016-12-07 DIAGNOSIS — M40292 Other kyphosis, cervical region: Secondary | ICD-10-CM | POA: Diagnosis not present

## 2016-12-07 DIAGNOSIS — M542 Cervicalgia: Secondary | ICD-10-CM | POA: Diagnosis not present

## 2016-12-07 DIAGNOSIS — M4712 Other spondylosis with myelopathy, cervical region: Secondary | ICD-10-CM | POA: Diagnosis not present

## 2016-12-08 ENCOUNTER — Encounter (HOSPITAL_COMMUNITY): Payer: Medicare Other

## 2016-12-08 DIAGNOSIS — M4712 Other spondylosis with myelopathy, cervical region: Secondary | ICD-10-CM | POA: Diagnosis not present

## 2016-12-08 DIAGNOSIS — M542 Cervicalgia: Secondary | ICD-10-CM | POA: Diagnosis not present

## 2016-12-08 DIAGNOSIS — M40292 Other kyphosis, cervical region: Secondary | ICD-10-CM | POA: Diagnosis not present

## 2016-12-08 DIAGNOSIS — M9901 Segmental and somatic dysfunction of cervical region: Secondary | ICD-10-CM | POA: Diagnosis not present

## 2016-12-13 ENCOUNTER — Encounter (HOSPITAL_COMMUNITY): Payer: Medicare Other

## 2016-12-15 ENCOUNTER — Encounter (HOSPITAL_COMMUNITY): Payer: Medicare Other

## 2016-12-15 DIAGNOSIS — M9901 Segmental and somatic dysfunction of cervical region: Secondary | ICD-10-CM | POA: Diagnosis not present

## 2016-12-15 DIAGNOSIS — M40292 Other kyphosis, cervical region: Secondary | ICD-10-CM | POA: Diagnosis not present

## 2016-12-15 DIAGNOSIS — M542 Cervicalgia: Secondary | ICD-10-CM | POA: Diagnosis not present

## 2016-12-15 DIAGNOSIS — M4712 Other spondylosis with myelopathy, cervical region: Secondary | ICD-10-CM | POA: Diagnosis not present

## 2016-12-20 ENCOUNTER — Encounter (HOSPITAL_COMMUNITY): Payer: Self-pay

## 2016-12-20 ENCOUNTER — Encounter (HOSPITAL_COMMUNITY)
Admission: RE | Admit: 2016-12-20 | Discharge: 2016-12-20 | Disposition: A | Discharge status: Home / Self Care | Payer: Medicare Other | Source: Ambulatory Visit | Attending: Internal Medicine | Admitting: Internal Medicine

## 2016-12-20 DIAGNOSIS — E7800 Pure hypercholesterolemia, unspecified: Secondary | ICD-10-CM | POA: Insufficient documentation

## 2016-12-20 DIAGNOSIS — Z7982 Long term (current) use of aspirin: Secondary | ICD-10-CM | POA: Insufficient documentation

## 2016-12-20 DIAGNOSIS — I7300 Raynaud's syndrome without gangrene: Secondary | ICD-10-CM | POA: Insufficient documentation

## 2016-12-20 DIAGNOSIS — I5032 Chronic diastolic (congestive) heart failure: Secondary | ICD-10-CM | POA: Insufficient documentation

## 2016-12-20 DIAGNOSIS — Z79899 Other long term (current) drug therapy: Secondary | ICD-10-CM | POA: Insufficient documentation

## 2016-12-20 DIAGNOSIS — J849 Interstitial pulmonary disease, unspecified: Secondary | ICD-10-CM | POA: Insufficient documentation

## 2016-12-20 NOTE — Progress Notes (Signed)
Pulmonary Maintenance Program Julie JonesCarolyn started the Pulmonary Maintenance program today. She tolerated exercise well today without complaints.

## 2016-12-21 ENCOUNTER — Encounter (HOSPITAL_COMMUNITY): Payer: Self-pay | Admitting: *Deleted

## 2016-12-22 ENCOUNTER — Encounter (HOSPITAL_COMMUNITY)
Admission: RE | Admit: 2016-12-22 | Discharge: 2016-12-22 | Disposition: A | Discharge status: Home / Self Care | Payer: Self-pay | Source: Ambulatory Visit | Attending: Internal Medicine | Admitting: Internal Medicine

## 2016-12-22 ENCOUNTER — Encounter (HOSPITAL_COMMUNITY): Payer: Self-pay

## 2016-12-27 ENCOUNTER — Encounter (HOSPITAL_COMMUNITY): Payer: Self-pay

## 2016-12-27 ENCOUNTER — Encounter (HOSPITAL_COMMUNITY): Payer: Medicare Other

## 2016-12-29 ENCOUNTER — Encounter (HOSPITAL_COMMUNITY): Payer: Self-pay

## 2016-12-29 ENCOUNTER — Encounter (HOSPITAL_COMMUNITY)
Admission: RE | Admit: 2016-12-29 | Discharge: 2016-12-29 | Disposition: A | Discharge status: Home / Self Care | Payer: Self-pay | Source: Ambulatory Visit | Attending: Internal Medicine | Admitting: Internal Medicine

## 2017-01-03 ENCOUNTER — Encounter (HOSPITAL_COMMUNITY)
Admission: RE | Admit: 2017-01-03 | Discharge: 2017-01-03 | Disposition: A | Discharge status: Home / Self Care | Payer: Self-pay | Source: Ambulatory Visit | Attending: Internal Medicine | Admitting: Internal Medicine

## 2017-01-03 ENCOUNTER — Encounter (HOSPITAL_COMMUNITY): Payer: Self-pay

## 2017-01-03 NOTE — Progress Notes (Signed)
Discharge Summary  Patient Details  Name: Julie PorchCarolyn L Stegmaier MRN: [id number] Date of Birth: 01/29/1951 Referring Provider:   Doristine DevoidFlowsheet Row Pulmonary Rehab Walk Test [facility]09[facility]  Referring Provider  Dr. Marchelle Gearingamaswamy       Number of Visits: 36  Reason for Discharge:  Patient reached a stable level of exercise.  Smoking History:  History  Smoking Status  . Never Smoker  Smokeless Tobacco  . Never Used    Diagnosis:  Interstitial lung disease (HCC)  ADL UCSD:     Pulmonary Assessment Scores    Row Name 12/21/16 1514         ADL UCSD   ADL Phase Exit     SOB Score total 67        Initial Exercise Prescription:   Discharge Exercise Prescription (Final Exercise Prescription Changes):     Exercise Prescription Changes - 11/22/16 1601      Response to Exercise   Blood Pressure (Admit) 100/56   Blood Pressure (Exercise) 112/60   Blood Pressure (Exit) 100/60   Heart Rate (Admit) 82 bpm   Heart Rate (Exercise) 108 bpm   Heart Rate (Exit) 83 bpm   Oxygen Saturation (Admit) 98 %   Oxygen Saturation (Exercise) 89 %   Oxygen Saturation (Exit) 99 %   Rating of Perceived Exertion (Exercise) 15   Perceived Dyspnea (Exercise) 3   Duration Progress to 45 minutes of aerobic exercise without signs/symptoms of physical distress   Intensity THRR unchanged     Progression   Progression Continue to progress workloads to maintain intensity without signs/symptoms of physical distress.     Resistance Training   Training Prescription Yes   Weight orange bands   Reps 10-12  10 minutes of strength training     Interval Training   Interval Training No     Oxygen   Oxygen Continuous   Liters 1     NuStep   Level 5   Minutes 17   METs 2.5     Track   Laps 14   Minutes 34      Functional Capacity:     6 Minute Walk    Row Name 11/24/16 1617         6 Minute Walk   Phase Discharge     Distance 1400 feet      Walk Time 6 minutes     # of Rest Breaks 0     MPH 2.65     METS 3.07     RPE 13     Perceived Dyspnea  3     Symptoms No     Resting HR 102 bpm     Resting BP 98/54     Max Ex. HR 128 bpm     Max Ex. BP 126/80       Interval HR   Baseline HR 102     1 Minute HR 123     2 Minute HR 126     3 Minute HR 126     4 Minute HR 125     5 Minute HR 126     6 Minute HR 128     2 Minute Post HR 91     Interval Heart Rate? Yes       Interval Oxygen   Interval Oxygen? Yes     Baseline Oxygen Saturation % 100 %     Baseline Liters of Oxygen 1 L  1 Minute Oxygen Saturation % 93 %     1 Minute Liters of Oxygen 1 L     2 Minute Oxygen Saturation % 88 %     2 Minute Liters of Oxygen 1 L     3 Minute Oxygen Saturation % 89 %     3 Minute Liters of Oxygen 1 L     4 Minute Oxygen Saturation % 90 %     4 Minute Liters of Oxygen 1 L     5 Minute Oxygen Saturation % 90 %     5 Minute Liters of Oxygen 1 L     6 Minute Oxygen Saturation % 88 %     6 Minute Liters of Oxygen 1 L     2 Minute Post Oxygen Saturation % 99 %     2 Minute Post Liters of Oxygen 1 L        Psychological, QOL, Others - Outcomes: PHQ 2/9: Depression screen [facility] 2/9 11/24/2016 06/03/2016 03/13/2016  Decreased Interest 0 1 0  Down, Depressed, Hopeless 0 1 0  PHQ - 2 Score 0 2 0  Altered sleeping - 0 -  Tired, decreased energy - 1 -  Change in appetite - 0 -  Feeling bad or failure about yourself  - 1 -  Trouble concentrating - 0 -  Moving slowly or fidgety/restless - 0 -  Suicidal thoughts - 0 -  PHQ-9 Score - 4 -  Difficult doing work/chores - Not difficult at all -    Quality of Life:     Quality of Life - 12/21/16 1515      Quality of Life Scores   Health/Function Post 9.71 %   Socioeconomic Post 13.88 %   Psych/Spiritual Post 17.64 %   Family Post 24 %   GLOBAL Post 13.83 %      Personal Goals: Goals established at orientation with interventions provided to work toward goal.    Personal  Goals Discharge:     Goals and Risk Factor Review    Row Name 07/25/16 1545 08/25/16 0836 09/26/16 0903 10/17/16 1224 11/17/16 0951     Core Components/Risk Factors/Patient Goals Review   Personal Goals Review  - Increase Strength and Stamina;Improve shortness of breath with ADL's;Develop more efficient breathing techniques such as purse lipped breathing and diaphragmatic breathing and practicing self-pacing with activity. Increase Strength and Stamina;Improve shortness of breath with ADL's;Develop more efficient breathing techniques such as purse lipped breathing and diaphragmatic breathing and practicing self-pacing with activity.;Increase knowledge of respiratory medications and ability to use respiratory devices properly. Develop more efficient breathing techniques such as purse lipped breathing and diaphragmatic breathing and practicing self-pacing with activity.;Improve shortness of breath with ADL's;Increase Strength and Stamina Develop more efficient breathing techniques such as purse lipped breathing and diaphragmatic breathing and practicing self-pacing with activity.;Improve shortness of breath with ADL's;Increase Strength and Stamina   Review has attended 10 exercise sessions, slowly increasing her workloads, still no improvement with in strength and stamina.  She is very weak has has many "bad" days where she is very tired Has attended 19 exercise sessions, not progressing, very dificult to progress due to weakness and not feeling well Has been extended to 30 exercise sessions due to slow progression., is progressing albeit slow. Has been extended to 36 exercise sessions due to slow progression, Is using a rower and increased the nustep to level 5, and walking 8-9 laps on track Has 3 more exercise sessions, slow to progress,  but her attendance is very regular and she does all she can tolerate.  Exercise has improved her strength, stamina, and given her a knowledge base that is greater than when  she started the program.     Expected Outcomes Hopefully will see an improvement in stamina in next 30 days. Has made very slow and minimal progress, resistance to increas workloads due to feeling weak She is fragile, progress slowly  - expect her to continue exercising on her own after graduation.        Nutrition & Weight - Outcomes:    Nutrition:     Nutrition Therapy & Goals - 07/19/16 1534      Nutrition Therapy   Diet Low Sodium     Personal Nutrition Goals   Personal Goal #1 Prevent further weight loss/maintain current weight while in Pulmonary Rehab     Intervention Plan   Intervention Prescribe, educate and counsel regarding individualized specific dietary modifications aiming towards targeted core components such as weight, hypertension, lipid management, diabetes, heart failure and other comorbidities.   Expected Outcomes Short Term Goal: Understand basic principles of dietary content, such as calories, fat, sodium, cholesterol and nutrients.;Long Term Goal: Adherence to prescribed nutrition plan.      Nutrition Discharge:     Nutrition Assessments - 12/30/16 0902      Rate Your Plate Scores   Pre Score 57   Post Score 58      Education Questionnaire Score:     Knowledge Questionnaire Score - 12/21/16 1513      Knowledge Questionnaire Score   Post Score 13/13      Goals reviewed with patient; copy given to patient.

## 2017-01-03 NOTE — Addendum Note (Signed)
Encounter addended by: Drema PryJoan A Jerrold Haskell, RN on: 01/03/2017  4:25 PM<BR>    Actions taken: Sign clinical note, Episode resolved

## 2017-01-05 ENCOUNTER — Encounter (HOSPITAL_COMMUNITY): Payer: Medicare Other

## 2017-01-05 ENCOUNTER — Encounter (HOSPITAL_COMMUNITY): Payer: Self-pay

## 2017-01-10 ENCOUNTER — Encounter (HOSPITAL_COMMUNITY): Payer: Medicare Other

## 2017-01-10 ENCOUNTER — Encounter (HOSPITAL_COMMUNITY): Payer: Self-pay

## 2017-01-11 DIAGNOSIS — M542 Cervicalgia: Secondary | ICD-10-CM | POA: Diagnosis not present

## 2017-01-11 DIAGNOSIS — M40292 Other kyphosis, cervical region: Secondary | ICD-10-CM | POA: Diagnosis not present

## 2017-01-11 DIAGNOSIS — M4712 Other spondylosis with myelopathy, cervical region: Secondary | ICD-10-CM | POA: Diagnosis not present

## 2017-01-11 DIAGNOSIS — M9901 Segmental and somatic dysfunction of cervical region: Secondary | ICD-10-CM | POA: Diagnosis not present

## 2017-01-12 ENCOUNTER — Encounter (HOSPITAL_COMMUNITY): Payer: Medicare Other

## 2017-01-12 ENCOUNTER — Encounter (HOSPITAL_COMMUNITY): Payer: Self-pay

## 2017-01-17 ENCOUNTER — Encounter (HOSPITAL_COMMUNITY): Payer: Self-pay

## 2017-01-17 ENCOUNTER — Encounter (HOSPITAL_COMMUNITY): Payer: Medicare Other

## 2017-01-17 DIAGNOSIS — M9901 Segmental and somatic dysfunction of cervical region: Secondary | ICD-10-CM | POA: Diagnosis not present

## 2017-01-17 DIAGNOSIS — M40292 Other kyphosis, cervical region: Secondary | ICD-10-CM | POA: Diagnosis not present

## 2017-01-17 DIAGNOSIS — M4712 Other spondylosis with myelopathy, cervical region: Secondary | ICD-10-CM | POA: Diagnosis not present

## 2017-01-17 DIAGNOSIS — M542 Cervicalgia: Secondary | ICD-10-CM | POA: Diagnosis not present

## 2017-01-19 ENCOUNTER — Encounter (HOSPITAL_COMMUNITY): Payer: Medicare Other

## 2017-01-19 ENCOUNTER — Encounter (HOSPITAL_COMMUNITY): Payer: Self-pay

## 2017-01-19 DIAGNOSIS — I7300 Raynaud's syndrome without gangrene: Secondary | ICD-10-CM | POA: Insufficient documentation

## 2017-01-19 DIAGNOSIS — I5032 Chronic diastolic (congestive) heart failure: Secondary | ICD-10-CM | POA: Insufficient documentation

## 2017-01-19 DIAGNOSIS — J849 Interstitial pulmonary disease, unspecified: Secondary | ICD-10-CM | POA: Insufficient documentation

## 2017-01-19 DIAGNOSIS — M40292 Other kyphosis, cervical region: Secondary | ICD-10-CM | POA: Diagnosis not present

## 2017-01-19 DIAGNOSIS — E7800 Pure hypercholesterolemia, unspecified: Secondary | ICD-10-CM | POA: Insufficient documentation

## 2017-01-19 DIAGNOSIS — M542 Cervicalgia: Secondary | ICD-10-CM | POA: Diagnosis not present

## 2017-01-19 DIAGNOSIS — Z7982 Long term (current) use of aspirin: Secondary | ICD-10-CM | POA: Insufficient documentation

## 2017-01-19 DIAGNOSIS — M4712 Other spondylosis with myelopathy, cervical region: Secondary | ICD-10-CM | POA: Diagnosis not present

## 2017-01-19 DIAGNOSIS — M9901 Segmental and somatic dysfunction of cervical region: Secondary | ICD-10-CM | POA: Diagnosis not present

## 2017-01-19 DIAGNOSIS — Z79899 Other long term (current) drug therapy: Secondary | ICD-10-CM | POA: Insufficient documentation

## 2017-01-24 ENCOUNTER — Encounter (HOSPITAL_COMMUNITY): Payer: Medicare Other

## 2017-01-24 ENCOUNTER — Telehealth (HOSPITAL_COMMUNITY): Payer: Self-pay | Admitting: Family Medicine

## 2017-01-24 DIAGNOSIS — M40292 Other kyphosis, cervical region: Secondary | ICD-10-CM | POA: Diagnosis not present

## 2017-01-24 DIAGNOSIS — M9901 Segmental and somatic dysfunction of cervical region: Secondary | ICD-10-CM | POA: Diagnosis not present

## 2017-01-24 DIAGNOSIS — M4712 Other spondylosis with myelopathy, cervical region: Secondary | ICD-10-CM | POA: Diagnosis not present

## 2017-01-24 DIAGNOSIS — M542 Cervicalgia: Secondary | ICD-10-CM | POA: Diagnosis not present

## 2017-01-24 NOTE — Telephone Encounter (Signed)
Pt called cancelled due to sickness... gave note to RN  ... KJ

## 2017-01-31 ENCOUNTER — Encounter (HOSPITAL_COMMUNITY)
Admission: RE | Admit: 2017-01-31 | Discharge: 2017-01-31 | Disposition: A | Discharge status: Home / Self Care | Payer: Self-pay | Source: Ambulatory Visit | Attending: Internal Medicine | Admitting: Internal Medicine

## 2017-01-31 DIAGNOSIS — M542 Cervicalgia: Secondary | ICD-10-CM | POA: Diagnosis not present

## 2017-01-31 DIAGNOSIS — M4712 Other spondylosis with myelopathy, cervical region: Secondary | ICD-10-CM | POA: Diagnosis not present

## 2017-01-31 DIAGNOSIS — M9901 Segmental and somatic dysfunction of cervical region: Secondary | ICD-10-CM | POA: Diagnosis not present

## 2017-01-31 DIAGNOSIS — M40292 Other kyphosis, cervical region: Secondary | ICD-10-CM | POA: Diagnosis not present

## 2017-02-02 DIAGNOSIS — M4712 Other spondylosis with myelopathy, cervical region: Secondary | ICD-10-CM | POA: Diagnosis not present

## 2017-02-02 DIAGNOSIS — M542 Cervicalgia: Secondary | ICD-10-CM | POA: Diagnosis not present

## 2017-02-02 DIAGNOSIS — M40292 Other kyphosis, cervical region: Secondary | ICD-10-CM | POA: Diagnosis not present

## 2017-02-02 DIAGNOSIS — M9901 Segmental and somatic dysfunction of cervical region: Secondary | ICD-10-CM | POA: Diagnosis not present

## 2017-02-07 ENCOUNTER — Encounter (HOSPITAL_COMMUNITY)
Admission: RE | Admit: 2017-02-07 | Discharge: 2017-02-07 | Disposition: A | Discharge status: Home / Self Care | Payer: Self-pay | Source: Ambulatory Visit | Attending: Internal Medicine | Admitting: Internal Medicine

## 2017-02-07 DIAGNOSIS — M4712 Other spondylosis with myelopathy, cervical region: Secondary | ICD-10-CM | POA: Diagnosis not present

## 2017-02-07 DIAGNOSIS — M542 Cervicalgia: Secondary | ICD-10-CM | POA: Diagnosis not present

## 2017-02-07 DIAGNOSIS — M9901 Segmental and somatic dysfunction of cervical region: Secondary | ICD-10-CM | POA: Diagnosis not present

## 2017-02-07 DIAGNOSIS — M40292 Other kyphosis, cervical region: Secondary | ICD-10-CM | POA: Diagnosis not present

## 2017-02-09 ENCOUNTER — Encounter (HOSPITAL_COMMUNITY)
Admission: RE | Admit: 2017-02-09 | Discharge: 2017-02-09 | Disposition: A | Discharge status: Home / Self Care | Payer: Self-pay | Source: Ambulatory Visit | Attending: Internal Medicine | Admitting: Internal Medicine

## 2017-02-14 ENCOUNTER — Encounter (HOSPITAL_COMMUNITY)
Admission: RE | Admit: 2017-02-14 | Discharge: 2017-02-14 | Disposition: A | Discharge status: Home / Self Care | Payer: Self-pay | Source: Ambulatory Visit | Attending: Internal Medicine | Admitting: Internal Medicine

## 2017-02-16 ENCOUNTER — Encounter (HOSPITAL_COMMUNITY)
Admission: RE | Admit: 2017-02-16 | Discharge: 2017-02-16 | Disposition: A | Discharge status: Home / Self Care | Payer: Self-pay | Source: Ambulatory Visit | Attending: Internal Medicine | Admitting: Internal Medicine

## 2017-02-16 DIAGNOSIS — E7800 Pure hypercholesterolemia, unspecified: Secondary | ICD-10-CM | POA: Insufficient documentation

## 2017-02-16 DIAGNOSIS — J849 Interstitial pulmonary disease, unspecified: Secondary | ICD-10-CM | POA: Insufficient documentation

## 2017-02-16 DIAGNOSIS — Z79899 Other long term (current) drug therapy: Secondary | ICD-10-CM | POA: Insufficient documentation

## 2017-02-16 DIAGNOSIS — I7300 Raynaud's syndrome without gangrene: Secondary | ICD-10-CM | POA: Insufficient documentation

## 2017-02-16 DIAGNOSIS — Z7982 Long term (current) use of aspirin: Secondary | ICD-10-CM | POA: Insufficient documentation

## 2017-02-16 DIAGNOSIS — I5032 Chronic diastolic (congestive) heart failure: Secondary | ICD-10-CM | POA: Insufficient documentation

## 2017-03-02 ENCOUNTER — Telehealth (HOSPITAL_COMMUNITY): Payer: Self-pay | Admitting: Family Medicine

## 2017-03-06 ENCOUNTER — Telehealth: Payer: Self-pay | Admitting: *Deleted

## 2017-03-06 NOTE — Telephone Encounter (Signed)
Patient declined to schedule AWV w/ Health Coach or follow-up w/ PCP at time of appointment.

## 2017-03-07 ENCOUNTER — Encounter (HOSPITAL_COMMUNITY)
Admission: RE | Admit: 2017-03-07 | Discharge: 2017-03-07 | Disposition: A | Discharge status: Home / Self Care | Payer: Self-pay | Source: Ambulatory Visit | Attending: Internal Medicine | Admitting: Internal Medicine

## 2017-03-13 ENCOUNTER — Telehealth: Payer: Self-pay | Admitting: Internal Medicine

## 2017-03-13 MED ORDER — AZITHROMYCIN 250 MG PO TABS: ORAL_TABLET | ORAL | 0 refills | Status: DC

## 2017-03-13 MED ORDER — PREDNISONE 10 MG PO TABS: ORAL_TABLET | ORAL | 0 refills | Status: DC

## 2017-03-13 NOTE — Telephone Encounter (Signed)
Pt aware of recs. Rx's sent to preferred pharmacy.  Nothing further needed.

## 2017-03-13 NOTE — Telephone Encounter (Signed)
Call in Z pack.  She will need a more prolonged pred taper. Call in prednisone 40 mg. Reduce dose by 10 mg every 3 days.

## 2017-03-13 NOTE — Telephone Encounter (Signed)
Pt c/o increased sob, sinus congestion, pnd, prod cough with increased yellow/clear mucus, chest tightness X1 week.   Denies fever, chest pain.  Pt took 40mg  prednisone yesterday and 30mg  prednisone today, nothing else to help with recs.  Pt notes she was given a pred taper (40/30/20/10 X1 day each) to hold on to in case of situations like this.  Requesting further recs.    Pt [facility] [address].    Sending to DOD as MR is 11pm elink tonight.  PM please advise on recs.  Thanks!

## 2017-03-14 ENCOUNTER — Telehealth: Payer: Self-pay | Admitting: Internal Medicine

## 2017-03-14 MED ORDER — DOXYCYCLINE HYCLATE 100 MG PO TABS: 100.0000 mg | ORAL_TABLET | Freq: Two times a day (BID) | ORAL | 0 refills | Status: DC

## 2017-03-14 NOTE — Telephone Encounter (Signed)
Spoke with pt, states that she is not able to tolerate azithromycin (zpak called in yesterday per PM), and is concerned with taking such a long pred taper (40mg  X3 days, decreasing 10mg  q3d)- pt states prednisone makes her jittery and wants to know if she can take a shorter or less pred taper.  Also requesting a different abx.  Sending to DOD as MR is not on scheduled.  PM please advise.  Thanks.

## 2017-03-14 NOTE — Addendum Note (Signed)
Addended by: Marcellus ScottADKINS, Kensi Karr L on: 03/14/2017 02:52 PM   Modules accepted: Orders

## 2017-03-14 NOTE — Telephone Encounter (Signed)
Called and spoke with pt and she is aware of PM recs.  Doxy has been sent to her pharmacy. Nothing further is needed.

## 2017-03-14 NOTE — Telephone Encounter (Signed)
Call in Doxy 100 mg bid for 7 days. She can reduce dose of prednisone by 10 mg every 1-2 days.

## 2017-03-28 ENCOUNTER — Encounter (HOSPITAL_COMMUNITY): Admission: RE | Admit: 2017-03-28 | Payer: Self-pay | Source: Ambulatory Visit

## 2017-03-28 DIAGNOSIS — Z7982 Long term (current) use of aspirin: Secondary | ICD-10-CM | POA: Insufficient documentation

## 2017-03-28 DIAGNOSIS — I5032 Chronic diastolic (congestive) heart failure: Secondary | ICD-10-CM | POA: Insufficient documentation

## 2017-03-28 DIAGNOSIS — E7800 Pure hypercholesterolemia, unspecified: Secondary | ICD-10-CM | POA: Insufficient documentation

## 2017-03-28 DIAGNOSIS — J849 Interstitial pulmonary disease, unspecified: Secondary | ICD-10-CM | POA: Insufficient documentation

## 2017-03-28 DIAGNOSIS — I7300 Raynaud's syndrome without gangrene: Secondary | ICD-10-CM | POA: Insufficient documentation

## 2017-03-28 DIAGNOSIS — Z79899 Other long term (current) drug therapy: Secondary | ICD-10-CM | POA: Insufficient documentation

## 2017-03-30 ENCOUNTER — Encounter (HOSPITAL_COMMUNITY): Payer: Self-pay

## 2017-04-04 ENCOUNTER — Encounter (HOSPITAL_COMMUNITY): Payer: Self-pay

## 2017-04-06 ENCOUNTER — Encounter (HOSPITAL_COMMUNITY): Payer: Self-pay

## 2017-04-11 ENCOUNTER — Encounter (HOSPITAL_COMMUNITY): Payer: Self-pay

## 2017-04-13 ENCOUNTER — Encounter (HOSPITAL_COMMUNITY)
Admission: RE | Admit: 2017-04-13 | Discharge: 2017-04-13 | Disposition: A | Discharge status: Home / Self Care | Payer: Self-pay | Source: Ambulatory Visit | Attending: Internal Medicine | Admitting: Internal Medicine

## 2017-04-13 NOTE — Progress Notes (Signed)
Julie Page called today and states that she has been sick most of the month and that coming to exercise is not working out. She has asked to be discharged from the Pulmonary Maintenance program. I encouraged her to call us if we could help her in the future.

## 2017-04-18 ENCOUNTER — Encounter (HOSPITAL_COMMUNITY): Payer: Medicare Other

## 2017-04-20 ENCOUNTER — Encounter (HOSPITAL_COMMUNITY): Payer: Medicare Other

## 2017-04-25 ENCOUNTER — Encounter (HOSPITAL_COMMUNITY): Payer: Medicare Other

## 2017-04-27 ENCOUNTER — Encounter (HOSPITAL_COMMUNITY): Payer: Medicare Other

## 2017-05-02 ENCOUNTER — Encounter (HOSPITAL_COMMUNITY): Payer: Medicare Other

## 2017-05-04 ENCOUNTER — Encounter (HOSPITAL_COMMUNITY): Payer: Medicare Other

## 2017-05-09 ENCOUNTER — Encounter (HOSPITAL_COMMUNITY): Payer: Medicare Other

## 2017-05-10 ENCOUNTER — Telehealth: Payer: Self-pay | Admitting: Internal Medicine

## 2017-05-10 NOTE — Telephone Encounter (Signed)
Error.Julie Page ° °

## 2017-05-11 ENCOUNTER — Encounter (HOSPITAL_COMMUNITY): Payer: Medicare Other

## 2017-05-16 ENCOUNTER — Encounter (HOSPITAL_COMMUNITY): Payer: Medicare Other

## 2017-05-18 ENCOUNTER — Encounter (HOSPITAL_COMMUNITY): Payer: Medicare Other

## 2017-05-22 ENCOUNTER — Ambulatory Visit: Payer: Medicare Other

## 2017-05-22 ENCOUNTER — Encounter: Payer: Self-pay | Admitting: Internal Medicine

## 2017-05-22 ENCOUNTER — Ambulatory Visit (INDEPENDENT_AMBULATORY_CARE_PROVIDER_SITE_OTHER): Payer: Medicare Other | Admitting: Internal Medicine

## 2017-05-22 DIAGNOSIS — J84112 Idiopathic pulmonary fibrosis: Secondary | ICD-10-CM

## 2017-05-22 MED ORDER — CEPHALEXIN 500 MG PO CAPS: 500.0000 mg | ORAL_CAPSULE | Freq: Three times a day (TID) | ORAL | 0 refills | Status: DC

## 2017-05-22 MED ORDER — PREDNISONE 10 MG PO TABS: ORAL_TABLET | ORAL | 0 refills | Status: DC

## 2017-05-22 NOTE — Progress Notes (Signed)
Subjective:     Patient ID: Julie Page, female   DOB: [birth year], 66 y.o.   MRN: [id number]  HPI    OV 04/28/2016  Chief Complaint  Patient presents with  . Follow-up    Pt here for HFU for pna and ILD. Pt states she is still occassionally having DOE, prod cough with clear mucus. Pt denies CP/tightness.    66 year old female presents with [place] on [facility] for evaluation of interstitial lung disease. History is gained from talking to her, her son review of the outside chart and old chart.  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 This building has mold in it and on and off ever since she started working that she started having respirations symptoms. In 2012 and 2013 she was evaluated by [name] in our institution. 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 There is also history of [name] and rheumatoid factor antibody. At that time pulmonary function test showed isolated low reduction in diffusion capacity that was mild. She only had dyspnea on climbing stairs at that point in time. She continued to be in stable health up until spring of 2017. During this time she continued to work in the building. In the interim she did follow-up with [name] at [facility] rheumatology. Pertinent lab tests are listed below. No systemic evidence of autoimmune disease was discovered and she was on serial follow-up.  Then most recently in April 2017 she had 2 weeks of respiratory exacerbation and then hospitalized for 1 week. CT scan of the chest at this point in time shows classic UIP pattern associated with diffuse groundglass opacity consistent with UIP flare. Significant progression since previous CT scan in 2012. She was discharged on steroid taper which she still is taking and 1 L oxygen. She is extremely physically  deconditioned. She spent some time in inpatient rehabilitation. She is now at home and is going to start home physical therapy. She notices that at room air at rest she would be 92% can be off oxygen for a few hours when she exerts she can drop into the 80s.  Echocardiogram at this point in time shows normal ejection fraction but elevated right ventricular and pulmonary artery systolic pressure to 40 mmHg.  She and her son have many questions pertaining to etiology, prognosis, treatment and outlook and disability application.    Autoimmune labs - Rheumatoid factor s 24 and in April 2013 was 59 in our health system. But with [name] at [facility] was 16 and 102, in 2014 in 2015 respectively - [name] was positive at 1 in April 2017 in our health system and was trace positive at 2.0 with [name] in September 2014. - No evidence that she's had CCP antibody tested for hypersensitivity panel tested -In September 2014 with [name] was trace positive but negative [name] scleroderma 70 SSB and double-stranded DNA antibodies.. Similar result in April 2017.  Waking desat test 185 feet x 3 laps on RA: desat to 86% in 1/2 to 1 lap and stopped    OV 05/24/2016  Chief Complaint  Patient presents with  . Follow-up    pt has not yet had R heart cath, just heard from cards yesterday.  pt has no complaints today.      Follow-up interstitial lung disease  provisional diagnosis of IPF. Presents with her son Julie Page discuss pulmonary function tests and autoimmune results  Last visit sent  CCP antibody and hypersensitivity pneumonitis profile and these were negative. We did pulmonary function test. She could not do her DLCO but FVC is 54% and a moderate severity. Currently she feels physically stronger but still very deconditioned from her admission. She is able to time 1 flight of stairs 1 L oxygen and desaturates to 88% especially when she coughs. Cough is still a problem for which  she is on palliative support with [facility]. She has now got a call from pulmonary rehabilitation and she plans to start that. She is working with cardiology to get an appointment to see [name] for right heart cath. She has not decided about anti-fibrotic therapy. She is very afraid of the side effects. She says she is very sensitive. She is worried about [name] because of sunscreen and she is worried about [name] because of potential cardiac risk. She feels that she wants to get stronger before she can try the medications. Although I cautioned her that IPF is unpredictable and can progress aggressively especially in the first after flare up. Her son Julie Page is in favor of taking her medications but she still not sure we discussed this extensively. She wants to consult with her other son   OV 08/31/2016  Chief Complaint  Patient presents with  . Follow-up    Pt states her breathing is doing well. Pt c/o prod cough with clear mucus. Pt denies CP/tightness and f/c/s.      Follow-up idiopathic pulmonary fibrosis diagnoses made June 2017  This is a routine follow-up. She is now on pulmonary rehabilitation. She uses 1 L of oxygen. Conditioning has improved. Shortness of breath has improved. But she is unable to go off oxygen. She continues to recheck anti-fibrotic therapy. In fact today she also rejected flu shot. In general she is averse to taking medications because of side effect profile. She will have her right heart catheterization on 09/12/2016. Her main symptomatic issue is cough that is occasional but very severe when it happens. She gets fatigue. This unpredictability to its onset. She has codeine cough syrup that helps but she is reluctant to take. Because of side effects. She's not interested in other options such as treatment of sinus drainage or vocal cord irritation or acid reflux therapy or over-the-counter Phenergan cough syrup   OV 11/23/2016  Chief Complaint  Patient  presents with  . Follow-up    Go over PFT results and 2 month follow up. Breathing has been of for the past few months.    Frail 66 year old female clinical diagnosis of IPF made May/June 2017. Does not desire for anti-fibrotic therapy or flu shot or research protocols  Overall she feels improved. She had a right heart catheterization September 2017 as documented below and it is normal. She uses 1 L of oxygen. This is at rest. She is not doing ADLs and was able to drive here. She is not sitting on the wheelchair anymore. Nevertheless overall she is fatigued and frail. Her main concern today is that her oxygen portable system she wants a larger tank and a smaller tank. She is worried that she might only have one option. She is declined a flu shot. She is declined participating in IPF registry.  OV 05/22/2017  Chief Complaint  Patient presents with  . Follow-up    pt c/o increased sometimes prod cough with clear mucus X1  week.     Frail 66 year old female with clinical diagnosis of IPF made summer 2017. Does not desire anti-fibrotic therapy a flu shot or research protocols.  Overall stable. She uses 1 L oxygen 2 L oxygen with exertion. She has just the oxygen based on the symptoms and self-monitoring pulse ox at meter. She is able to drive. In the last 6 months there has not been any admissions to the hospital or new medical diagnoses. Now for the past one week she feels symptoms have deteriorated with increasing cough and increased white color of sputum but no fever or chills or wheezing. She is okay taking prednisone and antibiotics as long this is a short duration and acceptable dose.   Walking desaturation test on 05/22/2017 185 feet x 3 laps:  did NOT desaturate. Rest pulse ox was 98%, final pulse ox was 92%. HR response 93/min at rest to 119/min at peak exertion.       has a past medical history of Acute respiratory failure with hypoxia (HCC); Anemia; CAP (community acquired pneumonia);  Chronic diastolic heart failure (HCC); Hypercholesteremia; Hypoxia; ILD (interstitial lung disease) (HCC); Leukocytosis; Malnutrition of moderate degree (HCC); Palpitations; Protein calorie malnutrition (HCC); Pulmonary HTN (HCC); Raynaud's syndrome; and SIRS (systemic inflammatory response syndrome) (HCC).   reports that she has never smoked. She has never used smokeless tobacco.  Past Surgical History:  Procedure Laterality Date  . CARDIAC CATHETERIZATION N/A 09/12/2016   Procedure: Right Heart Cath;  Surgeon: Laurey Moralealton S McLean, MD;  [facility];  Service: Cardiovascular;  Laterality: N/A;  . TUBAL LIGATION  03/30/1978    Allergies  Allergen Reactions  . Purell Instant Hand [Alcohol] Cough  . Azithromycin Other (See Comments)    Burning and itching at IV infusion site  . Spiriva Handihaler [Tiotropium Bromide Monohydrate] Other (See Comments)    "Made me crazy"    Immunization History  Administered Date(s) Administered  . PPD Test 03/31/2016    Family History  Problem Relation Age of Onset  . Heart disease Mother   . Heart disease Maternal Grandfather   . Heart disease Father      Current Outpatient Prescriptions:  .  cholecalciferol (VITAMIN D) 400 units TABS tablet, Take 400 Units by mouth daily., Disp: , Rfl:  .  Cholecalciferol (VITAMIN D3 PO), Take 100 Units by mouth daily., Disp: , Rfl:  .  Digestive Enzymes (DIGESTIVE ENZYME PO), Take 1 tablet by mouth 3 (three) times daily. , Disp: , Rfl:  .  Magnesium 100 MG CAPS, Take 400 mg by mouth every evening., Disp: , Rfl:  .  Omega-3 Fatty Acids (FISH OIL PO), Take 1,500 mg by mouth daily., Disp: , Rfl:  .  OVER THE COUNTER MEDICATION, Take 2 tablets by mouth daily. Serra Peptase protein Digestive Enzyme supplement, Disp: , Rfl:  .  TURMERIC CURCUMIN PO, Take 1,100 mg by mouth daily as needed. Inflammation, Disp: , Rfl:  .  vitamin C (ASCORBIC ACID) 500 MG tablet, Take 500 mg by mouth daily., Disp: , Rfl:      Review of Systems     Objective:   Physical Exam  Constitutional: She is oriented to person, place, and time. She appears well-developed and well-nourished. No distress.  Frail Emaciated as befopre  HENT:  Head: Normocephalic and atraumatic.  Right Ear: External ear normal.  Left Ear: External ear normal.  Mouth/Throat: Oropharynx is clear and moist. No oropharyngeal exudate.  Dry cough Classic IPF pattern  Eyes: Conjunctivae and  EOM are normal. Pupils are equal, round, and reactive to light. Right eye exhibits no discharge. Left eye exhibits no discharge. No scleral icterus.  Neck: Normal range of motion. Neck supple. No JVD present. No tracheal deviation present. No thyromegaly present.  Cardiovascular: Normal rate, regular rhythm, normal heart sounds and intact distal pulses.  Exam reveals no gallop and no friction rub.   No murmur heard. Pulmonary/Chest: Effort normal. No respiratory distress. She has no wheezes. She has rales. She exhibits no tenderness.  Abdominal: Soft. Bowel sounds are normal. She exhibits no distension and no mass. There is no tenderness. There is no rebound and no guarding.  Musculoskeletal: Normal range of motion. She exhibits no edema or tenderness.  Lymphadenopathy:    She has no cervical adenopathy.  Neurological: She is alert and oriented to person, place, and time. She has normal reflexes. No cranial nerve deficit. She exhibits normal muscle tone. Coordination normal.  Skin: Skin is warm and dry. No rash noted. She is not diaphoretic. No erythema. No pallor.  Psychiatric: Judgment and thought content normal.  Flat affect like before  Vitals reviewed.  Vitals:   05/22/17 1514  BP: 130/74  Pulse: (!) 106  SpO2: 98%  Weight: 120 lb 12.8 oz (54.8 kg)  Height: 5' (1.524 m)    Estimated body mass index is 23.59 kg/m as calculated from the following:   Height as of this encounter: 5' (1.524 m).   Weight as of this encounter: 120 lb 12.8 oz  (54.8 kg).     Assessment:       ICD-9-CM ICD-10-CM   1. IPF (idiopathic pulmonary fibrosis) (HCC) 516.31 J84.112        Plan:     IPF (idiopathic pulmonary fibrosis) (HCC) Likely mild flare up due to acute bronchitis  Plan Take prednisone 40 mg daily x 2 days, then 20mg  daily x 2 days, then 10mg  daily x 2 days, then 5mg  daily x 2 days and stop  STart cephalexin 500mg  three times daily x  5 days  Use o2 with exertion like before  Supportive care otherwise  I do recommend pulmonary rehab when you feel up for it  followup Return in 4 weeks for 6 months or sooner if needed   Dr. Kalman Shan, M.D., [facility].C.P Pulmonary and Critical Care Medicine Staff Physician [place] [facility] [place] Pulmonary and Critical Care Pager: [phone], If no answer or between  15:00h - 7:00h: call [phone]  05/22/2017 3:40 PM

## 2017-05-22 NOTE — Patient Instructions (Addendum)
   IPF (idiopathic pulmonary fibrosis) (HCC) Likely mild flare up due to acute bronchitis  Plan Take prednisone 40 mg daily x 2 days, then 20mg  daily x 2 days, then 10mg  daily x 2 days, then 5mg  daily x 2 days and stop  STart cephalexin 500mg  three times daily x  5 days  Use o2 with exertion like before  Supportive care otherwise  I do recommend pulmonary rehab when you feel up for it  followup Return in 4 weeks for 6mwd 6 months or sooner if needed

## 2017-05-22 NOTE — Assessment & Plan Note (Addendum)
Likely mild flare up due to acute bronchitis  Plan Take prednisone 40 mg daily x 2 days, then 20mg  daily x 2 days, then 10mg  daily x 2 days, then 5mg  daily x 2 days and stop  STart cephalexin 500mg  three times daily x  5 days  Use o2 with exertion like before  Supportive care otherwise  I do recommend pulmonary rehab when you feel up for it  followup Return in 4 weeks for 6mwd 6 months or sooner if needed

## 2017-05-23 ENCOUNTER — Encounter (HOSPITAL_COMMUNITY): Payer: Medicare Other

## 2017-05-25 ENCOUNTER — Encounter (HOSPITAL_COMMUNITY): Payer: Medicare Other

## 2017-05-30 ENCOUNTER — Encounter (HOSPITAL_COMMUNITY): Payer: Medicare Other

## 2017-06-01 ENCOUNTER — Encounter (HOSPITAL_COMMUNITY): Payer: Medicare Other

## 2017-06-05 ENCOUNTER — Telehealth: Payer: Self-pay | Admitting: Internal Medicine

## 2017-06-05 NOTE — Telephone Encounter (Signed)
Will take care of this when she comes in on 06/19/17

## 2017-06-06 ENCOUNTER — Encounter (HOSPITAL_COMMUNITY): Payer: Medicare Other

## 2017-06-08 ENCOUNTER — Encounter (HOSPITAL_COMMUNITY): Payer: Medicare Other

## 2017-06-09 ENCOUNTER — Telehealth: Payer: Self-pay | Admitting: Internal Medicine

## 2017-06-09 NOTE — Telephone Encounter (Signed)
If better p last ov when received pred rec Take 4 for three days 3 for three days 2 for three days 1 for three days and stop  If not better should not try to rx over the phone > will need UC or ER if can't fit in here today

## 2017-06-09 NOTE — Telephone Encounter (Signed)
Called and spoke with pt and she stated that she does not feel comfortable taking that large of a dose of prednisone.  She stated that the last time she took the prednisone it was 40 mg , 30 mg , 20 mg 10 mg 5 mg.  She did very well on this dose but stated that the dose the MW suggested is too much for her and she will not take that.  MW please advise. Thanks

## 2017-06-09 NOTE — Telephone Encounter (Signed)
Spoke with pt. States that she is not feeling well. Reports cough and increased SOB. Cough is producing clear mucus. Denies chest tightness, wheezing or fever. Symptoms started 2 days ago. No OTC meds have been tried. Would like recommendations.  MW - please advise. Thanks.

## 2017-06-09 NOTE — Telephone Encounter (Signed)
Theres' no rule on the dose so that's fine as long as she answers the question: did she get better or not.  If the answer is no, then don't call in at all

## 2017-06-09 NOTE — Telephone Encounter (Signed)
Spoke with pt. States that she did improve when she took the prednisone. Pt still doesn't want to take that much prednisone. States that she feels better since calling this morning and doesn't want any medication.

## 2017-06-13 ENCOUNTER — Encounter (HOSPITAL_COMMUNITY): Payer: Medicare Other

## 2017-06-15 ENCOUNTER — Telehealth: Payer: Self-pay | Admitting: Internal Medicine

## 2017-06-15 ENCOUNTER — Encounter (HOSPITAL_COMMUNITY): Payer: Medicare Other

## 2017-06-15 MED ORDER — PREDNISONE 10 MG PO TABS: ORAL_TABLET | ORAL | 0 refills | Status: DC

## 2017-06-15 MED ORDER — CEPHALEXIN 500 MG PO CAPS: 500.0000 mg | ORAL_CAPSULE | Freq: Three times a day (TID) | ORAL | 0 refills | Status: DC

## 2017-06-15 NOTE — Telephone Encounter (Signed)
Pt c/o increased cough and tightness is right side of back x 1 day.  Some mucus production first thing in the morning -- clear. Denies SOB and wheezing. Notes having this x 1 week ago but it resolved then her current symptoms started. Pt using Prednisone that she had on hand - Pt took 20mg  today.   Pt reports having Ceftin prescribed previously for similar symptoms and it seemed to work okay.  Pt states that she is exposed to second hand cigarette smoke through the air vents in her apartment -- pt states that her neighbors smoke and she can smell it.   [facility]   Please advise Dr Marchelle Gearingamaswamy. Thanks.

## 2017-06-15 NOTE — Telephone Encounter (Signed)
recommened  cephalexin 500mg  three times daily x  5 days (similar to ceftin)  Please take prednisone 40 mg x1 day, then 30 mg x1 day, then 20 mg x1 day, then 10 mg x1 day, and then 5 mg x1 day and stop   ER if worse  Dr. Kalman ShanMurali Ruthe Roemer, M.D., [facility].P Pulmonary and Critical Care Medicine Staff Physician [place] [facility] [place] Pulmonary and Critical Care Pager: [phone], If no answer or between  15:00h - 7:00h: call [phone]  06/15/2017 10:19 AM

## 2017-06-15 NOTE — Telephone Encounter (Signed)
Pt is aware of MR's recommendations and voiced her understanding. Rx sent to preferred pharmacy. Nothing further needed.

## 2017-06-19 ENCOUNTER — Ambulatory Visit (INDEPENDENT_AMBULATORY_CARE_PROVIDER_SITE_OTHER): Payer: Medicare Other | Admitting: *Deleted

## 2017-06-19 ENCOUNTER — Telehealth: Payer: Self-pay | Admitting: Internal Medicine

## 2017-06-19 DIAGNOSIS — J84112 Idiopathic pulmonary fibrosis: Secondary | ICD-10-CM

## 2017-06-19 DIAGNOSIS — J849 Interstitial pulmonary disease, unspecified: Secondary | ICD-10-CM

## 2017-06-19 DIAGNOSIS — R0600 Dyspnea, unspecified: Secondary | ICD-10-CM

## 2017-06-19 NOTE — Telephone Encounter (Signed)
Letter printed and placed in MR's look-at for signature.   [facility].

## 2017-06-19 NOTE — Progress Notes (Signed)
SIX MIN WALK 06/19/2017 05/22/2017 04/28/2016  Medications Prednisone 5mg  (0730) - -  Supplimental Oxygen during Test? (L/min) No No No  Laps 5 - -  Partial Lap (in Meters) 0 - -  Baseline BP (sitting) 120/70 - -  Baseline Heartrate 60 - -  Baseline Dyspnea (Borg Scale) 2 - -  Baseline Fatigue (Borg Scale) 2 - -  Baseline SPO2 94 - -  BP (sitting) 128/88 - -  Heartrate 118 - -  Dyspnea (Borg Scale) 3 - -  Fatigue (Borg Scale) 3 - -  SPO2 90 - -  BP (sitting) 124/80 - -  Heartrate 100 - -  SPO2 97 - -  Stopped or Paused before Six Minutes No - -  Interpretation Hip pain - -  Distance Completed 240 - -  Tech Comments: - pt walked a moderate pace, tolerated walk well.  -

## 2017-06-19 NOTE — Telephone Encounter (Signed)
Pt's jury summons has been placed in MR's look at. She is wanting to be excused from jury duty.  MR - please advise. Thanks.

## 2017-06-19 NOTE — Telephone Encounter (Signed)
  To Whom It May Concern  Julie PorchCarolyn L Page 06/07/1951\ [address] [place] [id number] is a patient of mine. She is frail and suffers from life threatening IPF lung disease. She is in now way capable of doing jury duty physically or emotionally. She should not under any circumstance do jury duty  Dr. Kalman ShanMurali Malorie Bigford, M.D., [facility].C.P Pulmonary and Critical Care Medicine Staff Physician [place] [facility] [place] Pulmonary and Critical Care Pager: [phone], If no answer or between  15:00h - 7:00h: call [phone]  06/19/2017 4:42 PM

## 2017-06-20 ENCOUNTER — Encounter (HOSPITAL_COMMUNITY): Payer: Medicare Other

## 2017-06-22 ENCOUNTER — Encounter: Payer: Self-pay | Admitting: Emergency Medicine

## 2017-06-22 ENCOUNTER — Encounter (HOSPITAL_COMMUNITY): Payer: Medicare Other

## 2017-06-22 NOTE — Telephone Encounter (Signed)
EE please advise of letter once it has been signed. Thanks

## 2017-06-23 NOTE — Telephone Encounter (Signed)
Letter has been signed. Called and spoke to pt. She requests to pick up letter with summons. Both have been placed in accordian folder. Pt verbalize understanding. Pt is requesting an ONO on RA.   MR please advise if ok to order ONO. Thanks.

## 2017-06-23 NOTE — Telephone Encounter (Signed)
Ok to order ONO  Dr. Kalman ShanMurali Antara Brecheisen, M.D., [facility].P Pulmonary and Critical Care Medicine Staff Physician [place] [facility] [place] Pulmonary and Critical Care Pager: [phone], If no answer or between  15:00h - 7:00h: call [phone]  06/23/2017 9:13 PM

## 2017-06-26 NOTE — Telephone Encounter (Signed)
ONO on RA has been ordered. Pt is aware and voiced her understanding. Nothing further needed.

## 2017-06-27 ENCOUNTER — Encounter (HOSPITAL_COMMUNITY): Payer: Medicare Other

## 2017-06-29 ENCOUNTER — Encounter (HOSPITAL_COMMUNITY): Payer: Medicare Other

## 2017-06-30 DIAGNOSIS — J449 Chronic obstructive pulmonary disease, unspecified: Secondary | ICD-10-CM | POA: Diagnosis not present

## 2017-06-30 DIAGNOSIS — R0902 Hypoxemia: Secondary | ICD-10-CM | POA: Diagnosis not present

## 2017-07-04 ENCOUNTER — Encounter (HOSPITAL_COMMUNITY): Payer: Medicare Other

## 2017-07-06 ENCOUNTER — Encounter (HOSPITAL_COMMUNITY): Payer: Medicare Other

## 2017-07-11 ENCOUNTER — Encounter (HOSPITAL_COMMUNITY): Payer: Medicare Other

## 2017-07-12 ENCOUNTER — Telehealth: Payer: Self-pay | Admitting: Internal Medicine

## 2017-07-12 NOTE — Telephone Encounter (Signed)
ono on RA 06/29/17 - 18 mn  8 sec with oulse ox < / = 88%. Ok to start 1L [place] at night  Dr. Kalman ShanMurali Myson Levi, M.D., [facility].C.P Pulmonary and Critical Care Medicine Staff Physician [place] [facility] [place] Pulmonary and Critical Care Pager: [phone], If no answer or between  15:00h - 7:00h: call [phone]  07/12/2017 5:34 PM

## 2017-07-13 ENCOUNTER — Encounter (HOSPITAL_COMMUNITY): Payer: Medicare Other

## 2017-07-13 NOTE — Telephone Encounter (Signed)
Called and spoke to pt. Informed her of the results and recs per MR. Pt verbalized understanding. Pt already has a concentrator at home. Nothing further needed. Will sign off.

## 2017-07-18 ENCOUNTER — Encounter (HOSPITAL_COMMUNITY): Payer: Medicare Other

## 2017-07-20 ENCOUNTER — Encounter (HOSPITAL_COMMUNITY): Payer: Medicare Other

## 2017-07-25 ENCOUNTER — Encounter (HOSPITAL_COMMUNITY): Payer: Medicare Other

## 2017-07-27 ENCOUNTER — Encounter (HOSPITAL_COMMUNITY): Payer: Medicare Other

## 2017-08-01 ENCOUNTER — Encounter (HOSPITAL_COMMUNITY): Payer: Medicare Other

## 2017-08-03 ENCOUNTER — Encounter (HOSPITAL_COMMUNITY): Payer: Medicare Other

## 2017-08-08 ENCOUNTER — Encounter (HOSPITAL_COMMUNITY): Payer: Medicare Other

## 2017-08-10 ENCOUNTER — Encounter (HOSPITAL_COMMUNITY): Payer: Medicare Other

## 2017-08-15 ENCOUNTER — Encounter (HOSPITAL_COMMUNITY): Payer: Medicare Other

## 2017-08-17 ENCOUNTER — Encounter (HOSPITAL_COMMUNITY): Payer: Medicare Other

## 2017-08-22 ENCOUNTER — Encounter (HOSPITAL_COMMUNITY): Payer: Medicare Other

## 2017-08-24 ENCOUNTER — Encounter (HOSPITAL_COMMUNITY): Payer: Medicare Other

## 2017-08-25 ENCOUNTER — Telehealth: Payer: Self-pay | Admitting: Internal Medicine

## 2017-08-25 NOTE — Telephone Encounter (Signed)
Spoke with Shanda Bumpsjessica and pt can come in today at 245 for app but will need to be here by 2 for cxr.    Called and spoke with pt and given the time for the appt and she stated that she was seen by her chiropractor today and he told her she had a rib that was out of place and that was what was causing all her pain.  She stated that she is feeling better and feels that she does not need an appt after all.

## 2017-08-25 NOTE — Telephone Encounter (Signed)
Called and spoke with pt and she stated that she is having discomfort in her lower right rib cage, increase in fatigue, SOB is normal for her but over the last 2 days has increased some with activity, cough is the same for her.  She wanted to be seen today, but we have no openings.  RA please advise since MR is not reachable.  Thanks  Allergies  Allergen Reactions  . Purell Instant Hand [Alcohol] Cough  . Azithromycin Other (See Comments)    Burning and itching at IV infusion site  . Spiriva Handihaler [Tiotropium Bromide Monohydrate] Other (See Comments)    "Made me crazy"

## 2017-08-25 NOTE — Telephone Encounter (Signed)
Okay for acute office visit with TP

## 2017-08-25 NOTE — Telephone Encounter (Signed)
lmtcb X1 for pt  

## 2017-08-29 ENCOUNTER — Encounter (HOSPITAL_COMMUNITY): Payer: Medicare Other

## 2017-08-31 ENCOUNTER — Encounter (HOSPITAL_COMMUNITY): Payer: Medicare Other

## 2017-09-05 ENCOUNTER — Encounter (HOSPITAL_COMMUNITY): Payer: Medicare Other

## 2017-09-07 ENCOUNTER — Encounter (HOSPITAL_COMMUNITY): Payer: Medicare Other

## 2017-09-12 ENCOUNTER — Encounter (HOSPITAL_COMMUNITY): Payer: Medicare Other

## 2017-09-14 ENCOUNTER — Encounter (HOSPITAL_COMMUNITY): Payer: Medicare Other

## 2017-09-19 ENCOUNTER — Encounter (HOSPITAL_COMMUNITY): Payer: Medicare Other

## 2017-09-21 ENCOUNTER — Ambulatory Visit (INDEPENDENT_AMBULATORY_CARE_PROVIDER_SITE_OTHER): Payer: Medicare Other | Admitting: Family Medicine

## 2017-09-21 ENCOUNTER — Encounter (HOSPITAL_COMMUNITY): Payer: Medicare Other

## 2017-09-21 VITALS — BP 122/72 | HR 98 | Temp 98.2°F | Ht 60.0 in | Wt 113.8 lb

## 2017-09-21 DIAGNOSIS — F329 Major depressive disorder, single episode, unspecified: Secondary | ICD-10-CM

## 2017-09-21 MED ORDER — BUPROPION HCL ER (XL) 150 MG PO TB24: 150.0000 mg | ORAL_TABLET | Freq: Every day | ORAL | 3 refills | Status: AC

## 2017-09-21 MED ORDER — BUPROPION HCL 75 MG PO TABS: 75.0000 mg | ORAL_TABLET | Freq: Two times a day (BID) | ORAL | 3 refills | Status: DC

## 2017-09-21 NOTE — Progress Notes (Signed)
[facility] [address] [place], [place] [address] [phone]  Date:  09/21/2017   Name:  Julie Page   DOB:  1951-11-09   MRN:  [id number]  PCP:  Pearline Cables, MD    Chief Complaint: Depression (Pt would like to dicuss possibly starting an antidepressant. Pt has had one in the past. )  History of Present Illness:  Julie Page is a 66 y.o. very pleasant female patient who presents with the following:  I last saw her 5/17- at that time her chronic lung disease had taken a turn for the worse:  Here today to recheck following hospital stay for CAP.  unfortunately her baseline IPF has become much more symptomatic and she is now on oxygen.  Her recent decrease in health is distressing for her- however she hopes that she will soon be back to normal.  Declines treatment for depression at this time.  She is encouraged by the possibility of not having to use the large oxygen tank for outings- I will try to arrange a more portable O2 source for her  I have not seen [name] in some time. Unfortunately she does appear more ill overall. She is seeing pulmonology for her lung concerns and actually feels like her lung sx are better than this time last year.  Today she is mostly concerned about depression- with her health concerns, and thinking about plans for the future she is feeling quite down.  She has to decide about moving as she cannot stay in her current apt. She plans to move somewhere with her son She has tried a few different medications for depression in the past- wellbutrin worked the best for her.  She would like to use this again She was last on wellbutin about 10 years ago.   No SI  She sleeps well some nights, but not always.   appetite is a bit less than it was in the past but she is still eating pretty well She does not have a lot of energy  She is wearing oxygen right now, but does not always have to  wear it.  She does not have to wear it at night currenty Here today with her son who helps give the histor  Wt Readings from Last 3 Encounters:  09/21/17 113 lb 12.8 oz (51.6 kg)  05/22/17 120 lb 12.8 oz (54.8 kg)  11/23/16 120 lb (54.4 kg)     Patient Active Problem List   Diagnosis Date Noted  . IPF (idiopathic pulmonary fibrosis) (HCC) 04/28/2016  . Chronic hypoxemic respiratory failure (HCC) 04/28/2016  . Pulmonary hypertension (HCC) 04/28/2016  . Chronic diastolic heart failure (HCC)   . Malnutrition of moderate degree 03/28/2016  . Pulmonary HTN (HCC)   . Community acquired pneumonia   . Anemia 03/25/2016  . SIRS (systemic inflammatory response syndrome) (HCC) 03/25/2016  . Hypoxia   . ILD (interstitial lung disease) (HCC)   . CAP (community acquired pneumonia) 03/24/2016  . Chronic cough 04/09/2012  . Interstitial lung disease (HCC) 06/13/2011    Past Medical History:  Diagnosis Date  . Acute respiratory failure with hypoxia (HCC)   . Anemia    Chronic disease  . CAP (community acquired pneumonia)   . Chronic diastolic heart failure (HCC)   . Hypercholesteremia   . Hypoxia   . ILD (interstitial lung disease) (HCC)   . Leukocytosis   . Malnutrition of moderate degree (  HCC)   . Palpitations   . Protein calorie malnutrition (HCC)   . Pulmonary HTN (HCC)   . Raynaud's syndrome   . SIRS (systemic inflammatory response syndrome) (HCC)     Past Surgical History:  Procedure Laterality Date  . CARDIAC CATHETERIZATION N/A 09/12/2016   Procedure: Right Heart Cath;  Surgeon: Laurey Morale, MD;  Location: [facility] INVASIVE CV LAB;  Service: Cardiovascular;  Laterality: N/A;  . TUBAL LIGATION  03/30/1978    Social History  Substance Use Topics  . Smoking status: Never Smoker  . Smokeless tobacco: Never Used  . Alcohol use No     Comment: rare ETOH use    Family History  Problem Relation Age of Onset  . Heart disease Mother   . Heart disease Maternal Grandfather    . Heart disease Father     Allergies  Allergen Reactions  . Purell Instant Hand [Alcohol] Cough  . Azithromycin Other (See Comments)    Burning and itching at IV infusion site  . Spiriva Handihaler [Tiotropium Bromide Monohydrate] Other (See Comments)    "Made me crazy"    Medication list has been reviewed and updated.  Current Outpatient Prescriptions on File Prior to Visit  Medication Sig Dispense Refill  . cholecalciferol (VITAMIN D) 400 units TABS tablet Take 400 Units by mouth daily.    . Cholecalciferol (VITAMIN D3 PO) Take 100 Units by mouth daily.    . Digestive Enzymes (DIGESTIVE ENZYME PO) Take 1 tablet by mouth 3 (three) times daily.     . Magnesium 100 MG CAPS Take 400 mg by mouth every evening.    . Omega-3 Fatty Acids (FISH OIL PO) Take 1,500 mg by mouth daily.    Marland Kitchen OVER THE COUNTER MEDICATION Take 2 tablets by mouth daily. Serra Peptase protein Digestive Enzyme supplement    . TURMERIC CURCUMIN PO Take 1,100 mg by mouth daily as needed. Inflammation    . vitamin C (ASCORBIC ACID) 500 MG tablet Take 500 mg by mouth daily.     No current facility-administered medications on file prior to visit.     Review of Systems:  As per HPI- otherwise negative. No fever or chills No rash No ST She does tend to have some cough due to her lung disease    Physical Examination: Vitals:   09/21/17 1515  BP: 122/72  Pulse: 98  Temp: 98.2 F (36.8 C)  SpO2: 96%   Vitals:   09/21/17 1515  Weight: 113 lb 12.8 oz (51.6 kg)  Height: 5' (1.524 m)   Body mass index is 22.23 kg/m. Ideal Body Weight: Weight in (lb) to have BMI = 25: 127.7  GEN: WDWN, NAD, Non-toxic, A & O x 3, appears chronically ill, using oxygen in a portable tank HEENT: Atraumatic, Normocephalic. Neck supple. No masses, No LAD. Ears and Nose: No external deformity. CV: RRR, No M/G/R. No JVD. No thrill. No extra heart sounds. PULM: CTA B, no wheezes, crackles, rhonchi. No retractions. No resp.  distress. No accessory muscle use. EXTR: No c/c/e NEURO Normal gait.  PSYCH: Normally interactive. Conversant. Not depressed or anxious appearing.  Calm demeanor.    Assessment and Plan: Reactive depression - Plan: buPROPion (WELLBUTRIN XL) 150 MG 24 hr tablet, DISCONTINUED: buPROPion (WELLBUTRIN) 75 MG tablet  Here today to discuss recurrent depression, which is not surprising given her serious chronic health problems. She used wellbutrin in the past with good results and would like to go back on this medication  Signed  Lamar Blinks, MD

## 2017-09-21 NOTE — Patient Instructions (Addendum)
It was a pleasure to see you again today!  Take care and best of luck with your next step We started you on wellbutrin XL- you take this just once a day, in the morning Please let me know how it is working for you in about a month- you can certainly call me if you like!  If need be we can increase the dose of your wellbutrin

## 2017-09-26 ENCOUNTER — Encounter (HOSPITAL_COMMUNITY): Payer: Medicare Other

## 2017-09-28 ENCOUNTER — Encounter (HOSPITAL_COMMUNITY): Payer: Medicare Other

## 2017-10-03 ENCOUNTER — Encounter (HOSPITAL_COMMUNITY): Payer: Medicare Other

## 2017-10-05 ENCOUNTER — Encounter (HOSPITAL_COMMUNITY): Payer: Medicare Other

## 2017-10-10 ENCOUNTER — Encounter (HOSPITAL_COMMUNITY): Payer: Medicare Other

## 2017-10-12 ENCOUNTER — Encounter (HOSPITAL_COMMUNITY): Payer: Medicare Other

## 2017-10-17 ENCOUNTER — Encounter (HOSPITAL_COMMUNITY): Payer: Medicare Other

## 2017-10-19 ENCOUNTER — Encounter (HOSPITAL_COMMUNITY): Payer: Medicare Other

## 2017-11-20 ENCOUNTER — Ambulatory Visit (INDEPENDENT_AMBULATORY_CARE_PROVIDER_SITE_OTHER): Payer: Medicare Other | Admitting: Internal Medicine

## 2017-11-20 ENCOUNTER — Encounter: Payer: Self-pay | Admitting: Internal Medicine

## 2017-11-20 VITALS — BP 118/68 | HR 104 | Ht 60.0 in | Wt 113.8 lb

## 2017-11-20 DIAGNOSIS — R059 Cough, unspecified: Secondary | ICD-10-CM

## 2017-11-20 DIAGNOSIS — J84112 Idiopathic pulmonary fibrosis: Secondary | ICD-10-CM

## 2017-11-20 DIAGNOSIS — R000 Tachycardia, unspecified: Secondary | ICD-10-CM

## 2017-11-20 DIAGNOSIS — R05 Cough: Secondary | ICD-10-CM | POA: Diagnosis not present

## 2017-11-20 DIAGNOSIS — R0600 Dyspnea, unspecified: Secondary | ICD-10-CM | POA: Diagnosis not present

## 2017-11-20 MED ORDER — HYDROCODONE-HOMATROPINE 5-1.5 MG/5ML PO SYRP: 2.5000 mL | ORAL_SOLUTION | Freq: Four times a day (QID) | ORAL | 0 refills | Status: DC | PRN

## 2017-11-20 NOTE — Progress Notes (Signed)
Subjective:     Patient ID: Julie Page, female   DOB: 11-27-1951, 66 y.o.   MRN: [id number]  HPI     OV 04/28/2016  Chief Complaint  Patient presents with  . Follow-up    Pt here for HFU for pna and ILD. Pt states she is still occassionally having DOE, prod cough with clear mucus. Pt denies CP/tightness.    66 year old female presents with [place] on [facility] for evaluation of interstitial lung disease. History is gained from talking to her, her son review of the outside chart and old chart.  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 This building has mold in it and on and off ever since she started working that she started having respirations symptoms. In 2012 and 2013 she was evaluated by [name] in our institution. 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 There is also history of [name] and rheumatoid factor antibody. At that time pulmonary function test showed isolated low reduction in diffusion capacity that was mild. She only had dyspnea on climbing stairs at that point in time. She continued to be in stable health up until spring of 2017. During this time she continued to work in the building. In the interim she did follow-up with [name] at [facility] rheumatology. Pertinent lab tests are listed below. No systemic evidence of autoimmune disease was discovered and she was on serial follow-up.  Then most recently in April 2017 she had 2 weeks of respiratory exacerbation and then hospitalized for 1 week. CT scan of the chest at this point in time shows classic UIP pattern associated with diffuse groundglass opacity consistent with UIP flare. Significant progression since previous CT scan in 2012. She was discharged on steroid taper which she still is taking and 1 L oxygen. She is extremely physically  deconditioned. She spent some time in inpatient rehabilitation. She is now at home and is going to start home physical therapy. She notices that at room air at rest she would be 92% can be off oxygen for a few hours when she exerts she can drop into the 80s.  Echocardiogram at this point in time shows normal ejection fraction but elevated right ventricular and pulmonary artery systolic pressure to 40 mmHg.  She and her son have many questions pertaining to etiology, prognosis, treatment and outlook and disability application.    Autoimmune labs - Rheumatoid factor s 24 and in April 2013 was 59 in our health system. But with [name] at [facility] was 82 and 102, in 2014 in 2015 respectively - [name] was positive at 1 in April 2017 in our health system and was trace positive at 2.0 with [name] in September 2014. - No evidence that she's had CCP antibody tested for hypersensitivity panel tested -In September 2014 with [name] was trace positive but negative [name] scleroderma 70 SSB and double-stranded DNA antibodies.. Similar result in April 2017.  Waking desat test 185 feet x 3 laps on RA: desat to 86% in 1/2 to 1 lap and stopped    OV 05/24/2016  Chief Complaint  Patient presents with  . Follow-up    pt has not yet had R heart cath, just heard from cards yesterday.  pt has no complaints today.      Follow-up interstitial lung  disease provisional diagnosis of IPF. Presents with her son Suzan Slick discuss pulmonary function tests and autoimmune results  Last visit sent  CCP antibody and hypersensitivity pneumonitis profile and these were negative. We did pulmonary function test. She could not do her DLCO but FVC is 54% and a moderate severity. Currently she feels physically stronger but still very deconditioned from her admission. She is able to time 1 flight of stairs 1 L oxygen and desaturates to 88% especially when she coughs. Cough is still a problem for which  she is on palliative support with [facility]. She has now got a call from pulmonary rehabilitation and she plans to start that. She is working with cardiology to get an appointment to see Dr. Marca Ancona for right heart cath. She has not decided about anti-fibrotic therapy. She is very afraid of the side effects. She says she is very sensitive. She is worried about [name] because of sunscreen and she is worried about [name] because of potential cardiac risk. She feels that she wants to get stronger before she can try the medications. Although I cautioned her that IPF is unpredictable and can progress aggressively especially in the first after flare up. Her son Suzan Slick is in favor of taking her medications but she still not sure we discussed this extensively. She wants to consult with her other son   OV 08/31/2016  Chief Complaint  Patient presents with  . Follow-up    Pt states her breathing is doing well. Pt c/o prod cough with clear mucus. Pt denies CP/tightness and f/c/s.      Follow-up idiopathic pulmonary fibrosis diagnoses made June 2017  This is a routine follow-up. She is now on pulmonary rehabilitation. She uses 1 L of oxygen. Conditioning has improved. Shortness of breath has improved. But she is unable to go off oxygen. She continues to recheck anti-fibrotic therapy. In fact today she also rejected flu shot. In general she is averse to taking medications because of side effect profile. She will have her right heart catheterization on 09/12/2016. Her main symptomatic issue is cough that is occasional but very severe when it happens. She gets fatigue. This unpredictability to its onset. She has codeine cough syrup that helps but she is reluctant to take. Because of side effects. She's not interested in other options such as treatment of sinus drainage or vocal cord irritation or acid reflux therapy or over-the-counter Phenergan cough syrup   OV 11/23/2016  Chief Complaint  Patient  presents with  . Follow-up    Go over PFT results and 2 month follow up. Breathing has been of for the past few months.    Frail 67 year old female clinical diagnosis of IPF made May/June 2017. Does not desire for anti-fibrotic therapy or flu shot or research protocols  Overall she feels improved. She had a right heart catheterization September 2017 as documented below and it is normal. She uses 1 L of oxygen. This is at rest. She is not doing ADLs and was able to drive here. She is not sitting on the wheelchair anymore. Nevertheless overall she is fatigued and frail. Her main concern today is that her oxygen portable system she wants a larger tank and a smaller tank. She is worried that she might only have one option. She is declined a flu shot. She is declined participating in IPF registry.  OV 05/22/2017  Chief Complaint  Patient presents with  . Follow-up    pt c/o increased sometimes prod cough with clear mucus  X1 week.     Frail 66 year old female with clinical diagnosis of IPF made summer 2017. Does not desire anti-fibrotic therapy a flu shot or research protocols.  Overall stable. She uses 1 L oxygen 2 L oxygen with exertion. She has just the oxygen based on the symptoms and self-monitoring pulse ox at meter. She is able to drive. In the last 6 months there has not been any admissions to the hospital or new medical diagnoses. Now for the past one week she feels symptoms have deteriorated with increasing cough and increased white color of sputum but no fever or chills or wheezing. She is okay taking prednisone and antibiotics as long this is a short duration and acceptable dose.   Walking desaturation test on 05/22/2017 185 feet x 3 laps:  did NOT desaturate. Rest pulse ox was 98%, final pulse ox was 92%. HR response 93/min at rest to 119/min at peak exertion.    OV 11/20/2017  Chief Complaint  Patient presents with  . Follow-up    Pt states that she has been doing okay. States that she  still has a lot of coughing fits also at night that keeps her awake and also has a lot of SOB mainly with exertion. Denies any CP.     66 year old female with clinical diagnosis of IPF made summer 2017.  On observation therapy with supportive care because she does not desire anti-fibrotic therapy or research protocols  This is a routine 3273-month follow-up.  In the interim she continues to be on basic supportive care.  She is again expressing a desire not to take anti-fibrotic therapy or take flu shot for her IPF.  She uses oxygen at night and occasionally with exertion.  She agrees that she has progressive deconditioning.  She did see primary care physician for depression and was prescribed antidepressant.  I personally reviewed the note and confirmed it.  However because of her antipathy towards medication she has not started it.  She is reporting slow worsening of cough and shortness of breath.  The cough is more dominant than the shortness of breath.  She believes that codeine cough syrup can help her but she only wants a low dose and for occasional sparing use she wants a prescription.  She agrees to easy tachycardia and feels this is also limiting her.  In fact when we walked her today she started to labs and got very tachycardic.  She admits to progressive deconditioning.  Last EKG was in 2017 sinus tachycardia.  She did not have PFTs this visit.  King's interstitial lung disease question as documented below.  Walking desaturation test 185 feet x3 laps on room air with a forehead probe today November 20, 2017: She only walked 2 laps slowly and then stopped due to fatigue.  Her heart rate jumped from 102 at rest 228 at peak exertion.  Pulse ox dropped from 97% to 90% but did not meet significance for the oxygenation    Results for Julie PorchBARRIER, Laddie L (MRN [id number]) as of 11/20/2017 11:15  Ref. Range 05/12/2016 13:56 11/23/2016 14:43  FVC-Pre Latest Units: L 1.46 1.35  FVC-%Pred-Pre Latest Units: % 54  50  .   K-BILD ILD QUESTIONNAIRE, Symptom score over prior 2 weeks  7-none, 6-rarely, 5-occ, 5-some times, 3-sev times, 2-most times, 1-every time 11/20/2017   Dyspnea for stairs, incline or hill 1  Chest Tightness 6  Worry about seriousness of lung complaint 5  Avoided doing things that make you dyspneic 3  Have you felt loss of control of lung condition (reversed from original) 4  Felt fed up due to lung condition 4  Felt urge to breathe aka air hunger 5  Has lung condition made you feel anxious 6  How often have you experienced wheezing or whistling sound 7  How much of the time have you felt your lung dz is getting worse 6  How much has your lung condition interfered with job or daily task 3  Were you expecting your lung condition to get worse 6  How much has your lung function limited you carrying things like groceris 2  How much has your lung function made you think of EOL? 5  Total   Are you financially worse off 3  Grand Total       has a past medical history of Acute respiratory failure with hypoxia (HCC), Anemia, CAP (community acquired pneumonia), Chronic diastolic heart failure (HCC), Hypercholesteremia, Hypoxia, ILD (interstitial lung disease) (HCC), Leukocytosis, Malnutrition of moderate degree (HCC), Palpitations, Protein calorie malnutrition (HCC), Pulmonary HTN (HCC), Raynaud's syndrome, and SIRS (systemic inflammatory response syndrome) (HCC).   reports that  has never smoked. she has never used smokeless tobacco.  Past Surgical History:  Procedure Laterality Date  . CARDIAC CATHETERIZATION N/A 09/12/2016   Procedure: Right Heart Cath;  Surgeon: Laurey Morale, MD;  Location: [facility] INVASIVE CV LAB;  Service: Cardiovascular;  Laterality: N/A;  . TUBAL LIGATION  03/30/1978    Allergies  Allergen Reactions  . Purell Instant Hand [Alcohol] Cough  . Azithromycin Other (See Comments)    Burning and itching at IV infusion site  . Spiriva Handihaler [Tiotropium  Bromide Monohydrate] Other (See Comments)    "Made me crazy"    Immunization History  Administered Date(s) Administered  . PPD Test 03/31/2016    Family History  Problem Relation Age of Onset  . Heart disease Mother   . Heart disease Maternal Grandfather   . Heart disease Father      Current Outpatient Medications:  .  Cholecalciferol (VITAMIN D3 PO), Take 100 Units by mouth daily., Disp: , Rfl:  .  Digestive Enzymes (DIGESTIVE ENZYME PO), Take 1 tablet by mouth 3 (three) times daily. , Disp: , Rfl:  .  Magnesium 100 MG CAPS, Take 400 mg by mouth every evening., Disp: , Rfl:  .  Omega-3 Fatty Acids (FISH OIL PO), Take 1,500 mg by mouth daily., Disp: , Rfl:  .  OVER THE COUNTER MEDICATION, Take 2 tablets by mouth daily. Serra Peptase protein Digestive Enzyme supplement, Disp: , Rfl:  .  TURMERIC CURCUMIN PO, Take 1,100 mg by mouth daily as needed. Inflammation, Disp: , Rfl:  .  buPROPion (WELLBUTRIN XL) 150 MG 24 hr tablet, Take 1 tablet (150 mg total) by mouth daily. (Patient not taking: Reported on 11/20/2017), Disp: 90 tablet, Rfl: 3 .  vitamin C (ASCORBIC ACID) 500 MG tablet, Take 500 mg by mouth daily., Disp: , Rfl:     Review of Systems     Objective:   Physical Exam  Constitutional: She is oriented to person, place, and time. She appears well-developed and well-nourished. No distress.  deconditined  HENT:  Head: Normocephalic and atraumatic.  Right Ear: External ear normal.  Left Ear: External ear normal.  Mouth/Throat: Oropharynx is clear and moist. No oropharyngeal exudate.  Eyes: Conjunctivae and EOM are normal. Pupils are equal, round, and reactive to light. Right eye exhibits no discharge. Left eye exhibits no discharge. No scleral icterus.  Neck: Normal range of motion. Neck supple. No JVD present. No tracheal deviation present. No thyromegaly present.  Cardiovascular: Normal rate, regular rhythm, normal heart sounds and intact distal pulses. Exam reveals no  gallop and no friction rub.  No murmur heard. Pulmonary/Chest: Effort normal. No respiratory distress. She has no wheezes. She has rales. She exhibits no tenderness.  Abdominal: Soft. Bowel sounds are normal. She exhibits no distension and no mass. There is no tenderness. There is no rebound and no guarding.  Musculoskeletal: Normal range of motion. She exhibits no edema or tenderness.  Lymphadenopathy:    She has no cervical adenopathy.  Neurological: She is alert and oriented to person, place, and time. She has normal reflexes. No cranial nerve deficit. She exhibits normal muscle tone. Coordination normal.  Skin: Skin is warm and dry. No rash noted. She is not diaphoretic. No erythema. No pallor.  Psychiatric: Judgment normal.  Flat affect  Vitals reviewed.  Vitals:   11/20/17 1104  BP: 118/68  Pulse: (!) 104  SpO2: 94%  Weight: 113 lb 12.8 oz (51.6 kg)  Height: 5' (1.524 m)    Estimated body mass index is 22.23 kg/m as calculated from the following:   Height as of this encounter: 5' (1.524 m).   Weight as of this encounter: 113 lb 12.8 oz (51.6 kg).      Assessment:       ICD-10-CM   1. IPF (idiopathic pulmonary fibrosis) (HCC) J84.112   2. Dyspnea, unspecified type R06.00   3. Cough R05   4. Tachycardia R00.0        Plan:      Pulmonary fibrosis might be slightly progressive since summer 2018 but hard to tell Some of the shortness of breath could be because of physical deconditioning Some of the shortness of breath could also be because of fast heart rate which in turn could be due to physical deconditioning and IPF Cough due to pulmonary fibrosis and I did note that low doses of hydrocodone cough syrup is helped you  Plan -Respect desire to refuse flu shot and anti-fibrotic therapy and research protocols - Appreciate you being interested in pulmonary rehabilitation; Referral to pulmonary rehabilitation -Take a prescription for Hycodan cough syrup 2.5 mils once  daily as needed times 30 days but no refill. -In 6 months to spirometry with diffusion capacity in 6 months with spirometry with diffusion capacity  Follow-up -Return to see me in 6 months but after spirometry and diffusion capacity test -At the time of follow-up due King's interstitial lung disease questionnaire   > 50% of this > 25 min visit spent in face to face counseling or coordination of care    Dr. Kalman ShanMurali Donley Harland, M.D., [facility] HospitalF.C.C.P Pulmonary and Critical Care Medicine Staff Physician, [facility] Center Director - Interstitial Lung Disease  Program  Pulmonary Fibrosis [facility] [place], [place], [id number]  Pager: [phone], If no answer or between  15:00h - 7:00h: call [phone] Telephone: [phone]

## 2017-11-20 NOTE — Patient Instructions (Addendum)
ICD-10-CM   1. IPF (idiopathic pulmonary fibrosis) (HCC) J84.112   2. Dyspnea, unspecified type R06.00   3. Cough R05   4. Tachycardia R00.0    Pulmonary fibrosis might be slightly progressive since summer 2018 but hard to tell Some of the shortness of breath could be because of physical deconditioning Some of the shortness of breath could also be because of fast heart rate which in turn could be due to physical deconditioning and IPF Cough due to pulmonary fibrosis and I did note that low doses of hydrocodone cough syrup is helped you  Plan -Respect desire to refuse flu shot and anti-fibrotic therapy and research protocols - Appreciate you being interested in pulmonary rehabilitation; Referral to pulmonary rehabilitation -Take a prescription for Hycodan cough syrup 2.5 mils once daily as needed times 30 days but no refill. -In 6 months to spirometry with diffusion capacity in 6 months with spirometry with diffusion capacity  Follow-up -Return to see me in 6 months but after spirometry and diffusion capacity test -At the time of follow-up due [facility] interstitial lung disease questionnaire

## 2017-11-21 ENCOUNTER — Telehealth (HOSPITAL_COMMUNITY): Payer: Self-pay

## 2017-11-21 NOTE — Telephone Encounter (Signed)
[name] insurance is active and benefits verified through Medicare Part A & B - 20% co-insurance.  [name] insurance is active and benefits verified through [employer] - No co-pay, no deductible, no out of pocket, no co-insurance, and no pre-authorization is required.  Patient is covered at 100%.  Patient will be contacted and scheduled.

## 2017-11-21 NOTE — Telephone Encounter (Signed)
Attempted to call patient in regards to Pulmonary Rehab - Lm on Vm °

## 2017-11-22 ENCOUNTER — Telehealth (HOSPITAL_COMMUNITY): Payer: Self-pay

## 2017-11-22 NOTE — Telephone Encounter (Signed)
Patient returned phone call in regards to Pulmonary Rehab - Patient would like a phone call at the first of the year.

## 2017-11-23 ENCOUNTER — Telehealth: Payer: Self-pay | Admitting: Internal Medicine

## 2017-11-23 MED ORDER — PROMETHAZINE-CODEINE 6.25-10 MG/5ML PO SYRP: 5.0000 mL | ORAL_SOLUTION | Freq: Four times a day (QID) | ORAL | 0 refills | Status: AC | PRN

## 2017-11-23 NOTE — Telephone Encounter (Signed)
Spoke with pt, she sates she cannot take hydrocodone but she can take cough medication with codeine instead. MR can we switch her cough medication? Please advise.    [facility] [address]

## 2017-11-23 NOTE — Telephone Encounter (Signed)
[id number] x1 

## 2017-11-23 NOTE — Telephone Encounter (Signed)
Yes please make that switch  Thanks  Dr. Kalman ShanMurali Anes Rigel, M.D., [facility] Pulmonary and Critical Care Medicine Staff Physician, [facility] Center Director - Interstitial Lung Disease  Program  Pulmonary Fibrosis [facility] [place], [place], [id number]  Pager: [phone], If no answer or between  15:00h - 7:00h: call [phone] Telephone: [phone]

## 2017-11-23 NOTE — Telephone Encounter (Signed)
Ok, [facility] [address]. Pt notified.

## 2018-01-04 ENCOUNTER — Telehealth (HOSPITAL_COMMUNITY): Payer: Self-pay

## 2018-01-04 NOTE — Telephone Encounter (Signed)
Attempted to call patient in regards to Pulmonary Rehab - Lm on Vm °

## 2018-01-11 ENCOUNTER — Telehealth (HOSPITAL_COMMUNITY): Payer: Self-pay

## 2018-01-11 NOTE — Telephone Encounter (Signed)
2nd attempt to call patient in regards to Pulmonary Rehab - lm on vm. Sending letter. °

## 2018-01-18 ENCOUNTER — Telehealth (HOSPITAL_COMMUNITY): Payer: Self-pay

## 2018-01-18 NOTE — Telephone Encounter (Signed)
3rd attempt to call patient in regards to Pulmonary Rehab - Lm on vm

## 2018-03-03 IMAGING — CT CT CHEST HIGH RESOLUTION W/O CM
2 of 6 series · 15 of 36 positions shown, 18 images · non-contrast
Comparison: 03/22/2012 and 05/09/2011.

CLINICAL DATA: Increased shortness of breath and interstitial
markings on chest x-ray.

EXAM:
CT CHEST WITHOUT CONTRAST
TECHNIQUE: Multidetector CT imaging of the chest was performed following the
standard protocol without intravenous contrast. High resolution
imaging of the lungs, as well as inspiratory and expiratory imaging,
was performed.

[Series 2: chest with st · axial · 0.61mm/px · z∈[+1139,+1361]mm · 12 of 86 slices shown, 15 images]
[im 6/86  mediastinal]
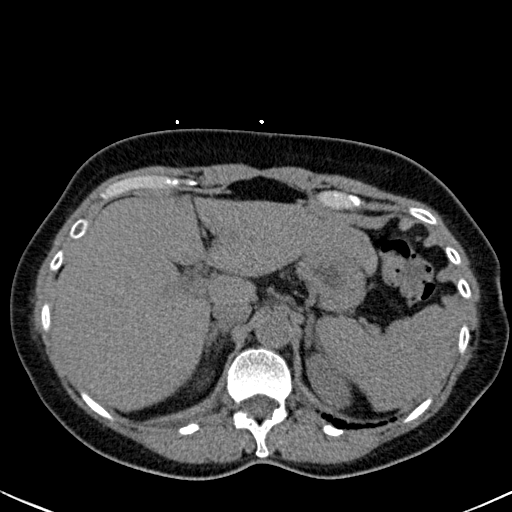
[im 6/86  lung]
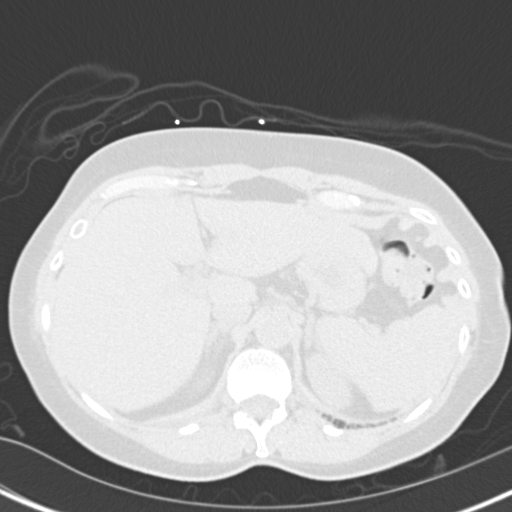
[im 11/86  lung]
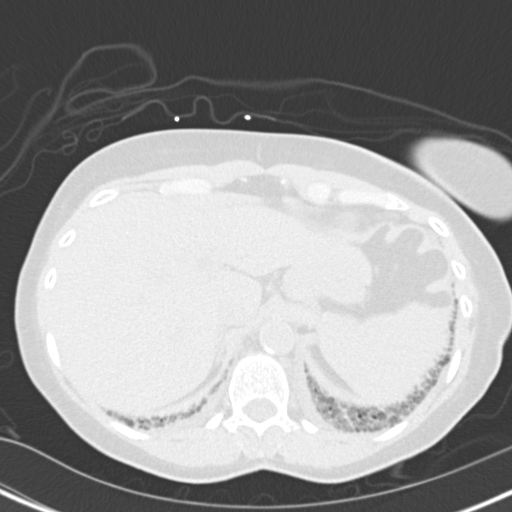
[im 22/86  lung]
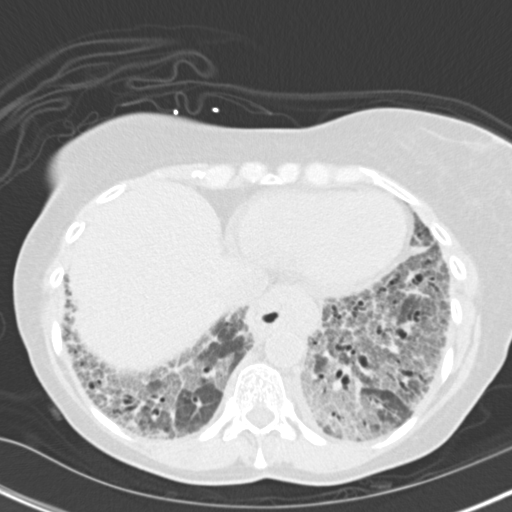
[im 27/86  lung]
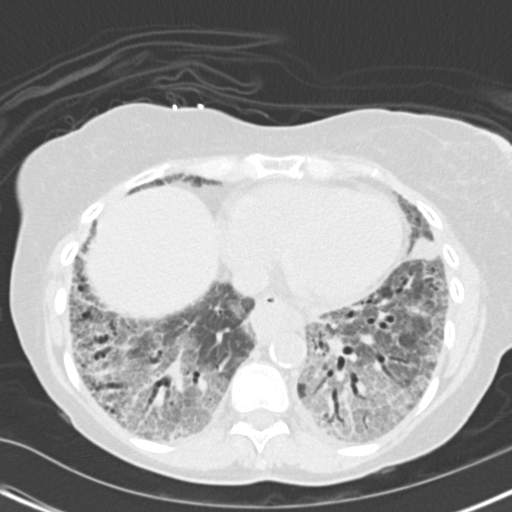
[im 32/86  mediastinal]
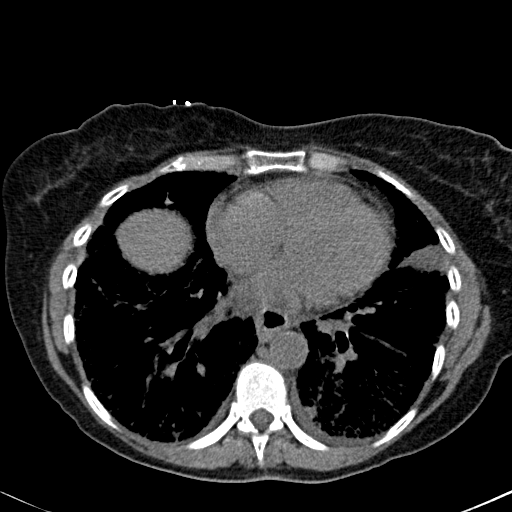
[im 32/86  lung]
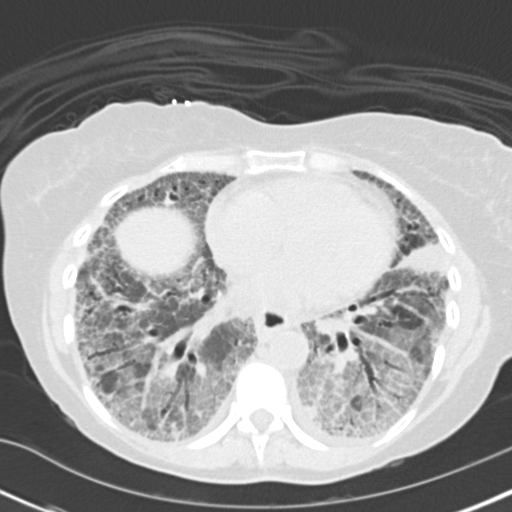
[im 38/86  lung]
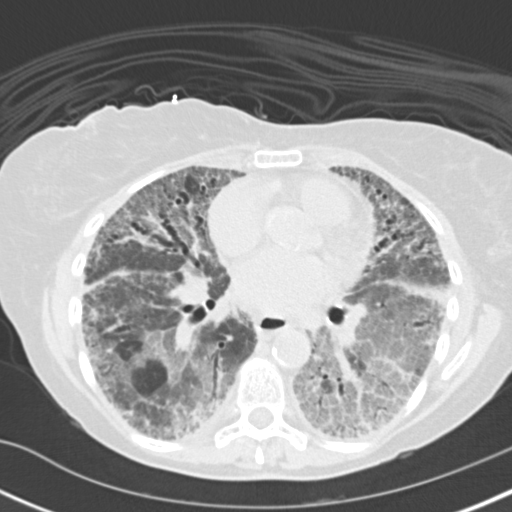
[im 48/86  lung]
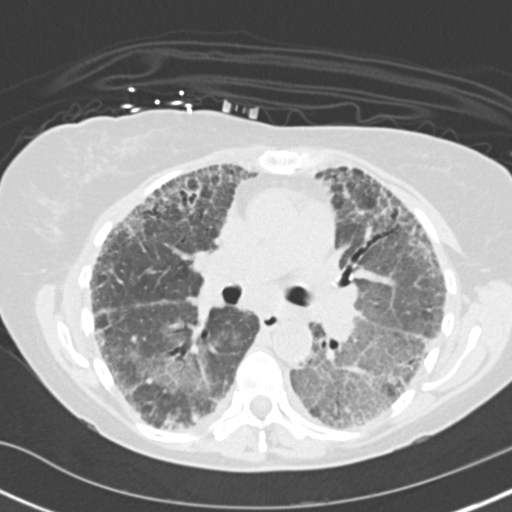
[im 54/86  lung]
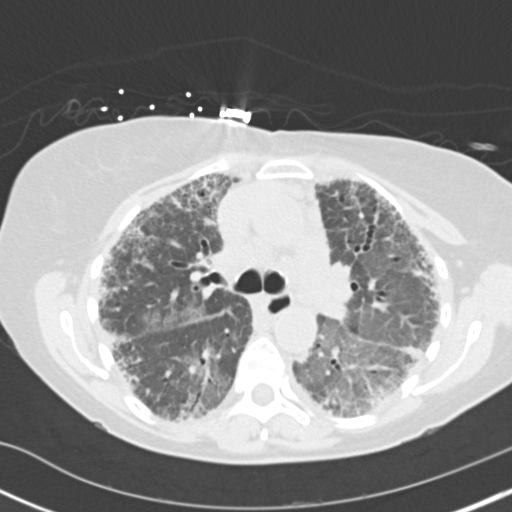
[im 59/86  mediastinal]
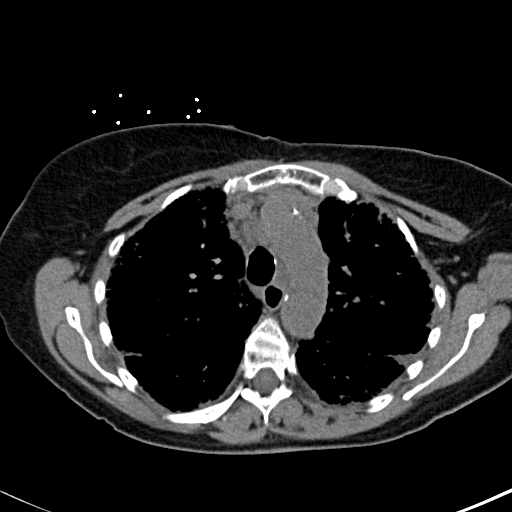
[im 59/86  lung]
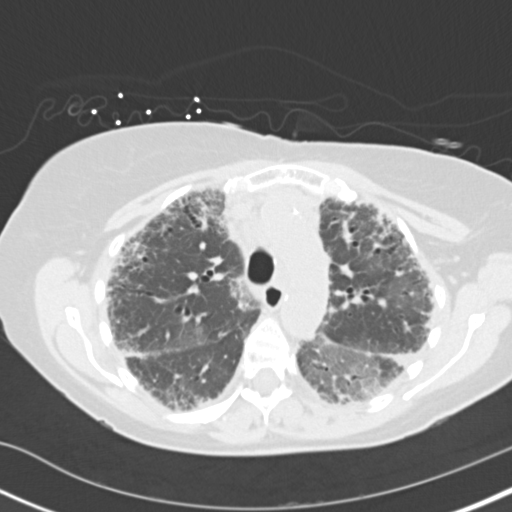
[im 64/86  lung]
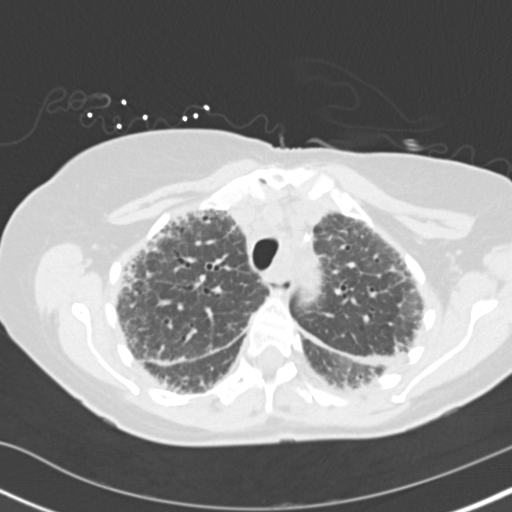
[im 75/86  lung]
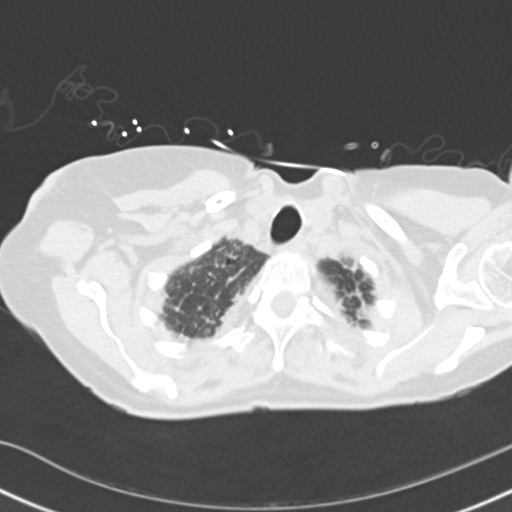
[im 80/86  lung]
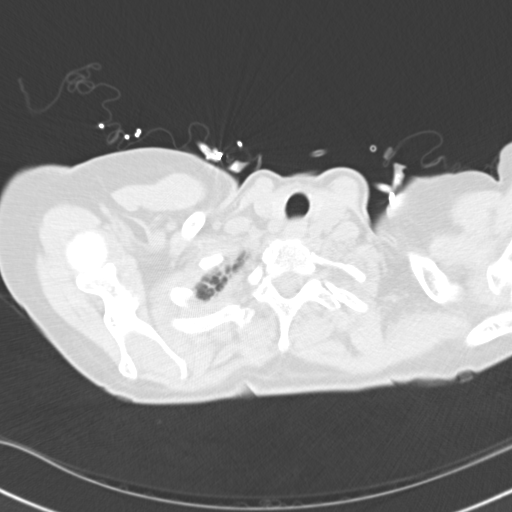

[Series 602: coronal · coronal · 0.61mm/px · 3 of 98 slices shown]
[im 20/98  lung]
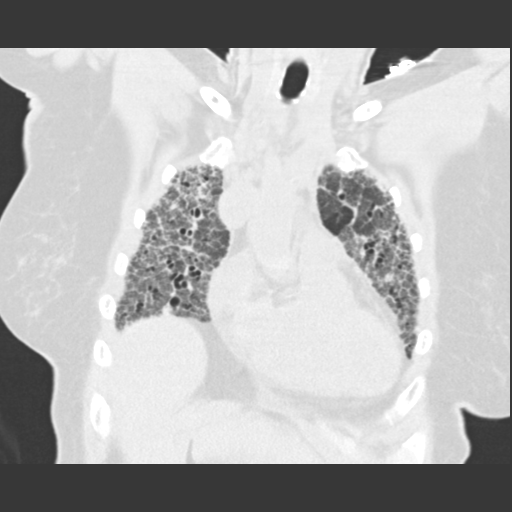
[im 39/98  lung]
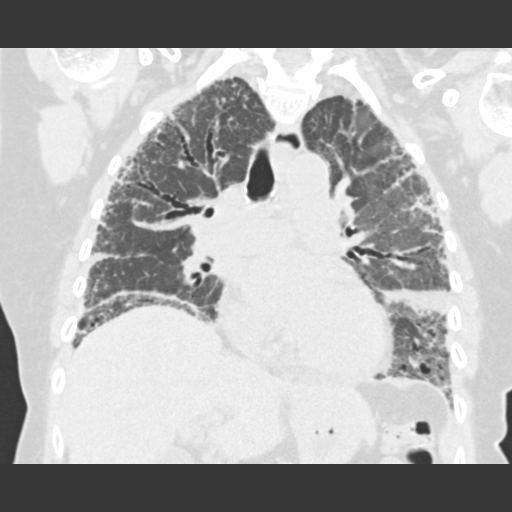
[im 59/98  lung]
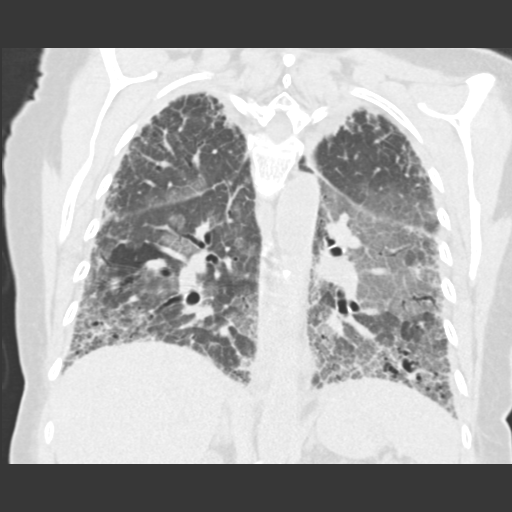

[15 of 36 positions shown; findings below may reference images not displayed]

FINDINGS: Mediastinum/Lymph Nodes: Mediastinal lymph nodes measure up to
cm in the lower right paratracheal station, increased from 1.0 cm.
Hilar regions are difficult to definitively evaluate without IV
contrast. No axillary adenopathy. Atherosclerotic calcification of
the arterial vasculature. Heart is at the upper limits of normal in
size. No pericardial effusion. Small hiatal hernia.

Lungs/Pleura: Subpleural reticulation and traction
bronchiectasis/bronchiolectasis appear progressive from 05/09/2011.
There is superimposed mid and lower lung zone predominant patchy
ground-glass, new. Trace bilateral pleural effusions. Airway is
unremarkable. Minimal air trapping.

Upper abdomen: Visualized portions of the liver, gallbladder,
adrenal glands, kidneys, spleen unremarkable. Small hiatal hernia.

Musculoskeletal: No worrisome lytic or sclerotic lesions.
IMPRESSION: 1. Basilar predominant patchy ground-glass may be due to edema or
pneumonia.
2. Subpleural reticulation traction bronchiectasis/bronchiolectasis,
progressive from 05/09/2011, indicative of usual interstitial
pneumonitis.
3. Tiny bilateral effusions.
4. Mediastinal adenopathy is likely reactive.

## 2018-03-14 ENCOUNTER — Ambulatory Visit (INDEPENDENT_AMBULATORY_CARE_PROVIDER_SITE_OTHER)
Admission: RE | Admit: 2018-03-14 | Discharge: 2018-03-14 | Disposition: A | Discharge status: Home / Self Care | Payer: Medicare Other | Source: Ambulatory Visit | Attending: Adult Health | Admitting: Adult Health

## 2018-03-14 ENCOUNTER — Other Ambulatory Visit (INDEPENDENT_AMBULATORY_CARE_PROVIDER_SITE_OTHER): Payer: Medicare Other

## 2018-03-14 ENCOUNTER — Encounter: Payer: Self-pay | Admitting: Adult Health

## 2018-03-14 ENCOUNTER — Ambulatory Visit (INDEPENDENT_AMBULATORY_CARE_PROVIDER_SITE_OTHER): Payer: Medicare Other | Admitting: Adult Health

## 2018-03-14 VITALS — BP 102/64 | HR 95 | Ht 60.5 in | Wt 101.8 lb

## 2018-03-14 DIAGNOSIS — J9611 Chronic respiratory failure with hypoxia: Secondary | ICD-10-CM

## 2018-03-14 DIAGNOSIS — R0600 Dyspnea, unspecified: Secondary | ICD-10-CM

## 2018-03-14 DIAGNOSIS — J84112 Idiopathic pulmonary fibrosis: Secondary | ICD-10-CM

## 2018-03-14 DIAGNOSIS — I5032 Chronic diastolic (congestive) heart failure: Secondary | ICD-10-CM

## 2018-03-14 DIAGNOSIS — J849 Interstitial pulmonary disease, unspecified: Secondary | ICD-10-CM

## 2018-03-14 DIAGNOSIS — R0602 Shortness of breath: Secondary | ICD-10-CM | POA: Diagnosis not present

## 2018-03-14 LAB — BASIC METABOLIC PANEL
BUN: 12 mg/dL (ref 6–23)
CALCIUM: 9.2 mg/dL (ref 8.4–10.5)
CO2: 28 mEq/L (ref 19–32)
CREATININE: 0.56 mg/dL (ref 0.40–1.20)
Chloride: 98 mEq/L (ref 96–112)
GFR: 114.8 mL/min (ref >60.00)
GLUCOSE: 107 mg/dL — AB (ref 70–99)
Potassium: 4 mEq/L (ref 3.5–5.1)
SODIUM: 136 meq/L (ref 135–145)

## 2018-03-14 LAB — CBC WITH DIFFERENTIAL/PLATELET
BASOS ABS: 0 10*3/uL (ref 0.0–0.1)
Basophils Relative: 0.3 % (ref 0.0–3.0)
EOS ABS: 0.3 10*3/uL (ref 0.0–0.7)
Eosinophils Relative: 2.6 % (ref 0.0–5.0)
HCT: 41.5 % (ref 36.0–46.0)
Hemoglobin: 13.8 g/dL (ref 12.0–15.0)
LYMPHS ABS: 2.8 10*3/uL (ref 0.7–4.0)
Lymphocytes Relative: 27.2 % (ref 12.0–46.0)
MCHC: 33.2 g/dL (ref 30.0–36.0)
MCV: 87.2 fl (ref 78.0–100.0)
MONOS PCT: 5.6 % (ref 3.0–12.0)
Monocytes Absolute: 0.6 10*3/uL (ref 0.1–1.0)
NEUTROS PCT: 64.3 % (ref 43.0–77.0)
Neutro Abs: 6.6 10*3/uL (ref 1.4–7.7)
Platelets: 329 10*3/uL (ref 150.0–400.0)
RBC: 4.76 Mil/uL (ref 3.87–5.11)
RDW: 13.5 % (ref 11.5–15.5)
WBC: 10.3 10*3/uL (ref 4.0–10.5)

## 2018-03-14 LAB — BRAIN NATRIURETIC PEPTIDE: Pro B Natriuretic peptide (BNP): 153 pg/mL — ABNORMAL HIGH (ref 0.0–100.0)

## 2018-03-14 MED ORDER — PREDNISONE 10 MG PO TABS: ORAL_TABLET | ORAL | 0 refills | Status: DC

## 2018-03-14 NOTE — Patient Instructions (Signed)
Begin prednisone taper. Mucinex DM twice daily as needed for cough and congestion Chest x-ray today Labs today Wear oxygen 2 L all the time Follow-up in 1 week with Dr. Marchelle Gearingamaswamy or nurse practitioner Please contact office for sooner follow up if symptoms do not improve or worsen or seek emergency care

## 2018-03-14 NOTE — Progress Notes (Signed)
 @Patient  ID: Julie Page, female    DOB: 09/30/1951, 67 y.o.   MRN: [id number]  Chief Complaint  Patient presents with  . Acute Visit    Cough     Referring provider: Pearline Cablesopland, Jessica C, MD  HPI: 67 yo female never smoker followed for ILD   TEST  Autoimmune labs - Rheumatoid factor s 24 and in April 2013 was 59 in our health system. [facility] was 4789 and 102, in 2014 in 2015 respectively - [name] was positive at 1 in April 2017 in our health system and was trace positive at 2.0 with Dr. Nickola MajorHawkes in September 2014. - No evidence that she's had CCP antibody tested for hypersensitivity panel tested -In September 2014 with Dr. Louann SjogrenHawkes ANA was trace positive but negative [name] scleroderma 70 SSB and double-stranded DNA antibodies.. Similar result in April 2017.  Waking desat test 185 feet x 3 laps on RA: desat to 86% in 1/2 to 1 lap and stopped   03/14/2018  Acute OV : ILD  Pt presents for an acute office visit. She is accompanied by her son. Says that her breathing has gotten worse for last few days . She feels fatigued and winded with minimal activity . Feels for last 3-6 months breathing has not been doing as well.  More dyspnea with activity, wears out easily . Activity tolerance has been declining .  Wt is trending down .-10lbs over last 6 months .she is normally on 1 l/m oxygen and takes on/off but now wearing 2l/m 24/7 .  On arrival today O2 sats 95% on room air .  Cough is dry and is at baseline with no flare . No fever, discolored mucus, urinary sx, chest pain, orthopnea, edema or hemoptysis . No calf pain .  She has presumed ILD with changes on CT chest , dating back to 2012.  She has declined antifibotics therapy .  She is very deconditioned. Says son helps her at home .   Allergies  Allergen Reactions  . Purell Instant Hand [Alcohol] Cough  . Azithromycin Other (See Comments)    Burning and itching at IV infusion site  .  Spiriva Handihaler [Tiotropium Bromide Monohydrate] Other (See Comments)    "Made me crazy"    Immunization History  Administered Date(s) Administered  . PPD Test 03/31/2016    Past Medical History:  Diagnosis Date  . Acute respiratory failure with hypoxia (HCC)   . Anemia    Chronic disease  . CAP (community acquired pneumonia)   . Chronic diastolic heart failure (HCC)   . Hypercholesteremia   . Hypoxia   . ILD (interstitial lung disease) (HCC)   . Leukocytosis   . Malnutrition of moderate degree (HCC)   . Palpitations   . Protein calorie malnutrition (HCC)   . Pulmonary HTN (HCC)   . Raynaud's syndrome   . SIRS (systemic inflammatory response syndrome) (HCC)     Tobacco History: Social History   Tobacco Use  Smoking Status Never Smoker  Smokeless Tobacco Never Used   Counseling given: Not Answered   Outpatient Encounter Medications as of 03/14/2018  Medication Sig  . Cholecalciferol (VITAMIN D3 PO) Take 100 Units by mouth daily.  . Digestive Enzymes (DIGESTIVE ENZYME PO) Take 1 tablet by mouth 3 (three) times daily.   . Magnesium 100 MG CAPS Take 400 mg by mouth every evening.  . Omega-3 Fatty Acids (FISH OIL PO) Take 1,500 mg by mouth daily.  .Marland Kitchen  OVER THE COUNTER MEDICATION Take 2 tablets by mouth daily. Serra Peptase protein Digestive Enzyme supplement  . promethazine-codeine (PHENERGAN WITH CODEINE) 6.25-10 MG/5ML syrup Take 5 mLs by mouth every 6 (six) hours as needed for cough.  . TURMERIC CURCUMIN PO Take 1,100 mg by mouth daily as needed. Inflammation  . vitamin C (ASCORBIC ACID) 500 MG tablet Take 500 mg by mouth daily.  Marland Kitchen buPROPion (WELLBUTRIN XL) 150 MG 24 hr tablet Take 1 tablet (150 mg total) by mouth daily. (Patient not taking: Reported on 11/20/2017)  . predniSONE (DELTASONE) 10 MG tablet 4 tabs for 2 days, then 3 tabs for 2 days, 2 tabs for 2 days, then 1 tab for 2 days, then stop  . [DISCONTINUED] HYDROcodone-homatropine (HYCODAN) 5-1.5 MG/5ML syrup  Take 2.5 mLs by mouth every 6 (six) hours as needed for cough. (Patient not taking: Reported on 03/14/2018)   No facility-administered encounter medications on file as of 03/14/2018.      Review of Systems  Constitutional:   No  weight loss, night sweats,  Fevers, chills, + fatigue, or  lassitude.  HEENT:   No headaches,  Difficulty swallowing,  Tooth/dental problems, or  Sore throat,                No sneezing, itching, ear ache, nasal congestion, post nasal drip,   CV:  No chest pain,  Orthopnea, PND, swelling in lower extremities, anasarca, dizziness, palpitations, syncope.   GI  No heartburn, indigestion, abdominal pain, nausea, vomiting, diarrhea, change in bowel habits, loss of appetite, bloody stools.   Resp: .  No chest wall deformity  Skin: no rash or lesions.  GU: no dysuria, change in color of urine, no urgency or frequency.  No flank pain, no hematuria   MS:  No joint pain or swelling.  No decreased range of motion.  No back pain.    Physical Exam  BP 102/64 (BP Location: Left Arm, Cuff Size: Normal)   Pulse 95   Ht 5' 0.5" (1.537 m)   Wt 101 lb 12.8 oz (46.2 kg)   SpO2 95%   BMI 19.55 kg/m   GEN: A/Ox3; pleasant , NAD, frail and elderly , in wc on O2 .    HEENT:  [place]/AT,  EACs-clear, TMs-wnl, NOSE-clear, THROAT-clear, no lesions, no postnasal drip or exudate noted.   NECK:  Supple w/ fair ROM; no JVD; normal carotid impulses w/o bruits; no thyromegaly or nodules palpated; no lymphadenopathy.    RESP  BB crackles . no accessory muscle use, no dullness to percussion  CARD:  RRR, no m/r/g, no peripheral edema, pulses intact, no cyanosis or clubbing.  GI:   Soft & nt; nml bowel sounds; no organomegaly or masses detected.   Musco: Warm bil, no deformities or joint swelling noted.   Neuro: alert, no focal deficits noted.    Skin: Warm, no lesions or rashes    Lab Results:  CBC   BNP  Imaging: Dg Chest 2 View  Result Date: 03/14/2018 CLINICAL  DATA:  History of pulmonary fibrosis. Worsening shortness of breath over the last 3 days. EXAM: CHEST - 2 VIEW COMPARISON:  03/23/2016 FINDINGS: Mild cardiomegaly. Chronic aortic atherosclerosis. Widespread interstitial lung disease is progressive over time, but without loss of volume. No effusions. No evidence of focal consolidation. Certainly, some minor inflammatory changes could be present hidden within the extensive chronic disease. IMPRESSION: Widespread interstitial lung disease, progressive over time, but without volume loss. No discernible infiltrate, collapse or effusion. As  noted above, low level inflammatory changes could be hidden within the chronic findings. Underlying etiology may be hypersensitivity pneumonitis rather than UIP. Electronically Signed   By: Paulina Fusi M.D.   On: 03/14/2018 12:57     Assessment & Plan:   ILD (interstitial lung disease) (HCC) ? Flare vs progressive decline  Long discussion with pt and son, she is very deconditioned and has made a significant decline would like to admit for further workup , check for dehydration, given IVF , look for signs of infection , etc.  She declines hospital admission , despite explanation of benefits of monitoring and access to test/meds.  Advised that if this is not improving or worsens she will definitely require admission - she verbalizes understanding .  Treat with empiric steroids and close follow up in 1 week.   Plan  Patient Instructions  Begin prednisone taper. Mucinex DM twice daily as needed for cough and congestion Chest x-ray today Labs today Wear oxygen 2 L all the time Follow-up in 1 week with Dr. Marchelle Gearing or nurse practitioner Please contact office for sooner follow up if symptoms do not improve or worsen or seek emergency care        Chronic hypoxemic respiratory failure (HCC) Cont on O2 2 l/m , keep sat >88-90%.   Chronic diastolic heart failure (HCC) Appears stable without fluid overload  Check  bnp.       Rubye Oaks, NP 03/14/2018

## 2018-03-14 NOTE — Assessment & Plan Note (Signed)
Cont on O2 2 l/m , keep sat >88-90%.

## 2018-03-14 NOTE — Assessment & Plan Note (Signed)
Appears stable without fluid overload  Check bnp.

## 2018-03-14 NOTE — Assessment & Plan Note (Signed)
?   Flare vs progressive decline  Long discussion with pt and son, she is very deconditioned and has made a significant decline would like to admit for further workup , check for dehydration, given IVF , look for signs of infection , etc.  She declines hospital admission , despite explanation of benefits of monitoring and access to test/meds.  Advised that if this is not improving or worsens she will definitely require admission - she verbalizes understanding .  Treat with empiric steroids and close follow up in 1 week.   Plan  Patient Instructions  Begin prednisone taper. Mucinex DM twice daily as needed for cough and congestion Chest x-ray today Labs today Wear oxygen 2 L all the time Follow-up in 1 week with Dr. Marchelle Gearingamaswamy or nurse practitioner Please contact office for sooner follow up if symptoms do not improve or worsen or seek emergency care

## 2018-03-15 ENCOUNTER — Other Ambulatory Visit: Payer: Self-pay | Admitting: Adult Health

## 2018-03-15 DIAGNOSIS — J849 Interstitial pulmonary disease, unspecified: Secondary | ICD-10-CM

## 2018-03-15 NOTE — Progress Notes (Signed)
TP spoke with patient regarding results Orders only encounter created for the HRCT

## 2018-03-15 NOTE — Progress Notes (Signed)
TP spoke with patient regarding results

## 2018-03-22 ENCOUNTER — Ambulatory Visit (INDEPENDENT_AMBULATORY_CARE_PROVIDER_SITE_OTHER): Payer: Medicare Other | Admitting: Acute Care

## 2018-03-22 ENCOUNTER — Encounter: Payer: Self-pay | Admitting: Acute Care

## 2018-03-22 VITALS — BP 104/68 | HR 89 | Ht 60.5 in | Wt 104.0 lb

## 2018-03-22 DIAGNOSIS — J9611 Chronic respiratory failure with hypoxia: Secondary | ICD-10-CM

## 2018-03-22 DIAGNOSIS — E440 Moderate protein-calorie malnutrition: Secondary | ICD-10-CM

## 2018-03-22 DIAGNOSIS — R05 Cough: Secondary | ICD-10-CM

## 2018-03-22 DIAGNOSIS — J849 Interstitial pulmonary disease, unspecified: Secondary | ICD-10-CM

## 2018-03-22 DIAGNOSIS — R059 Cough, unspecified: Secondary | ICD-10-CM

## 2018-03-22 MED ORDER — DOXYCYCLINE HYCLATE 100 MG PO TABS: 100.0000 mg | ORAL_TABLET | Freq: Two times a day (BID) | ORAL | 0 refills | Status: AC

## 2018-03-22 NOTE — Assessment & Plan Note (Signed)
Continue wearing oxygen at 2 L [place], keep saturations > 88-92%

## 2018-03-22 NOTE — Progress Notes (Addendum)
History of Present Illness Julie Page is a 67 y.o. female never smoker followed for presumed ILD with changes on CT chest , dating back to 2012.   Primary Pulmonary is Dr. Marchelle Gearing.    03/22/2018 Follow up OV for worsening dyspnea/ suspected progression of  ILD. Pt was seen by Rubye Oaks, NP on 03/14/2018 for an Acute OV for worsening dyspnea and fatigue with minimal activity.She told Tammy she has had a decline over the last 3-6 months. Her weight  has been  trending down .-10lbs over last 6 months .She is normally on 1 l/m oxygen and she  takes it on/off but had had to increase her flow  wearing 2l/m 24/7 .She stated her Cough was dry and was at baseline with no flare . No fever, discolored mucus, urinary sx, chest pain, orthopnea, edema or hemoptysis . No calf pain . She is very deconditioned and at the time of the OV she refused antibiotic therapy and hospital admission  for further workup , to check for dehydration, give IVF , look for signs of infection , etc.  She declines hospital admission , despite explanation of benefits of monitoring and access to test/meds. Plan was for pred taper, Mucinex DM, CXR and oxygen. Tammy emphasized that any worsening she was to present to the ED.CXR results noted below. Pt has an HRCT scheduled for 03/27/2018 to further evaluate for progression of ILD vs infectius process.  Pt. Presents for follow up. She states she took her last prednisone prescribed last by [employer]  week today. She states she is feeling better. She feels stronger than she did last week. Her cough is non-productive. She states she did have some chest tightness this morning and is  concerned that she may be starting a bronchitis flare. She refused antibiotics last week when she saw Tammy. She denies fever, chest pain, orthopnea or hemoptysis. She is compliant with her oxygen . Weight is up 3 pounds in the last week.  Test results: CXR 03/22/2018>>  Widespread interstitial lung  disease, progressive over time, but without volume loss. No discernible infiltrate, collapse or effusion. As noted above, low level inflammatory changes could be hidden within the chronic findings. Underlying etiology may be hypersensitivity pneumonitis rather than UIP.  Autoimmune labs - Rheumatoid factor s 24 and in April 2013 was 59 in our health system. But with [name] at [facility] was 59 and 102, in 2014 in 2015 respectively - [name] was positive at 1 in April 2017 in our health system and was trace positive at 2.0 with [name] in September 2014. - No evidence that she's had CCP antibody tested for hypersensitivity panel tested -In September 2014 with [name] was trace positive but negative [name] scleroderma 70 SSB and double-stranded DNA antibodies.. Similar result in April 2017.  Waking desat test 185 feet x 3 laps on RA: desat to 86% in 1/2 to 1 lap and stopped    CBC Latest Ref Rng & Units 03/14/2018 09/12/2016 04/07/2016  WBC 4.0 - 10.5 K/uL 10.3 7.7 12.2  Hemoglobin 12.0 - 15.0 g/dL 16.1 09.6 11.0(A)  Hematocrit 36.0 - 46.0 % 41.5 37.6 35(A)  Platelets 150.0 - 400.0 K/uL 329.0 261 503(A)    BMP Latest Ref Rng & Units 03/14/2018 09/12/2016 03/31/2016  Glucose 70 - 99 mg/dL 045(W) 96 94  BUN 6 - 23 mg/dL 12 9 09(W)  Creatinine 0.40 - 1.20 mg/dL 1.19 1.47 8.29  Sodium 135 - 145  mEq/L 136 138 140  Potassium 3.5 - 5.1 mEq/L 4.0 4.0 3.9  Chloride 96 - 112 mEq/L 98 101 106  CO2 19 - 32 mEq/L 28 29 25   Calcium 8.4 - 10.5 mg/dL 9.2 9.5 4.0(J)    BNP    Component Value Date/Time   BNP 200.2 (H) 03/25/2016 1725    ProBNP    Component Value Date/Time   PROBNP 153.0 (H) 03/14/2018 1245    PFT    Component Value Date/Time   FEV1PRE 1.28 11/23/2016 1443   FEV1POST 1.27 05/12/2016 1356   FVCPRE 1.35 11/23/2016 1443   FVCPOST 1.38 05/12/2016 1356   TLC 4.58 05/12/2016 1356   DLCOUNC 1.67 11/23/2016 1443   PREFEV1FVCRT 95 11/23/2016  1443   PSTFEV1FVCRT 92 05/12/2016 1356    Dg Chest 2 View  Result Date: 03/14/2018 CLINICAL DATA:  History of pulmonary fibrosis. Worsening shortness of breath over the last 3 days. EXAM: CHEST - 2 VIEW COMPARISON:  03/23/2016 FINDINGS: Mild cardiomegaly. Chronic aortic atherosclerosis. Widespread interstitial lung disease is progressive over time, but without loss of volume. No effusions. No evidence of focal consolidation. Certainly, some minor inflammatory changes could be present hidden within the extensive chronic disease. IMPRESSION: Widespread interstitial lung disease, progressive over time, but without volume loss. No discernible infiltrate, collapse or effusion. As noted above, low level inflammatory changes could be hidden within the chronic findings. Underlying etiology may be hypersensitivity pneumonitis rather than UIP. Electronically Signed   By: Paulina Fusi M.D.   On: 03/14/2018 12:57     Past medical hx Past Medical History:  Diagnosis Date  . Acute respiratory failure with hypoxia (HCC)   . Anemia    Chronic disease  . CAP (community acquired pneumonia)   . Chronic diastolic heart failure (HCC)   . Hypercholesteremia   . Hypoxia   . ILD (interstitial lung disease) (HCC)   . Leukocytosis   . Malnutrition of moderate degree (HCC)   . Palpitations   . Protein calorie malnutrition (HCC)   . Pulmonary HTN (HCC)   . Raynaud's syndrome   . SIRS (systemic inflammatory response syndrome) (HCC)      Social History   Tobacco Use  . Smoking status: Never Smoker  . Smokeless tobacco: Never Used  Substance Use Topics  . Alcohol use: No    Comment: rare ETOH use  . Drug use: No    Ms.Julie Page reports that she has never smoked. She has never used smokeless tobacco. She reports that she does not drink alcohol or use drugs.  Tobacco Cessation: Never smoker Past surgical hx, Family hx, Social hx all reviewed.  Current Outpatient Medications on File Prior to Visit    Medication Sig  . buPROPion (WELLBUTRIN XL) 150 MG 24 hr tablet Take 1 tablet (150 mg total) by mouth daily.  . Cholecalciferol (VITAMIN D3 PO) Take 100 Units by mouth daily.  . Digestive Enzymes (DIGESTIVE ENZYME PO) Take 1 tablet by mouth 3 (three) times daily.   . Magnesium 100 MG CAPS Take 400 mg by mouth every evening.  . Omega-3 Fatty Acids (FISH OIL PO) Take 1,500 mg by mouth daily.  Marland Kitchen OVER THE COUNTER MEDICATION Take 2 tablets by mouth daily. Serra Peptase protein Digestive Enzyme supplement  . promethazine-codeine (PHENERGAN WITH CODEINE) 6.25-10 MG/5ML syrup Take 5 mLs by mouth every 6 (six) hours as needed for cough.  . TURMERIC CURCUMIN PO Take 1,100 mg by mouth daily as needed. Inflammation  . vitamin C (ASCORBIC ACID)  500 MG tablet Take 500 mg by mouth daily.  . predniSONE (DELTASONE) 10 MG tablet 4 tabs for 2 days, then 3 tabs for 2 days, 2 tabs for 2 days, then 1 tab for 2 days, then stop (Patient not taking: Reported on 03/22/2018)   No current facility-administered medications on file prior to visit.      Allergies  Allergen Reactions  . Purell Instant Hand [Alcohol] Cough  . Azithromycin Other (See Comments)    Burning and itching at IV infusion site  . Spiriva Handihaler [Tiotropium Bromide Monohydrate] Other (See Comments)    "Made me crazy"    Review Of Systems:  Constitutional:   No  weight loss, night sweats,  Fevers, chills,+ fatigue, or  lassitude.  HEENT:   No headaches,  Difficulty swallowing,  Tooth/dental problems, or  Sore throat,                No sneezing, itching, ear ache, +nasal congestion,+ post nasal drip,   CV:  No chest pain,  Orthopnea, PND, swelling in lower extremities, anasarca, dizziness, palpitations, syncope. Occasional chest tightness.  GI  No heartburn, indigestion, abdominal pain, nausea, vomiting, diarrhea, change in bowel habits, loss of appetite, bloody stools.   Resp: + baseline  shortness of breath with exertion less at rest.   No excess mucus, no productive cough,  + non-productive cough,  No coughing up of blood.  No change in color of mucus.  No wheezing.  No chest wall deformity  Skin: no rash or lesions.  GU: no dysuria, change in color of urine, no urgency or frequency.  No flank pain, no hematuria   MS:  No joint pain or swelling.  No decreased range of motion.  No back pain.  Psych:  No change in mood or affect. No depression or anxiety.  No memory loss.   Vital Signs BP 104/68 (BP Location: Left Arm, Cuff Size: Normal)   Pulse 89   Ht 5' 0.5" (1.537 m)   Wt 104 lb (47.2 kg)   SpO2 94%   BMI 19.98 kg/m    Physical Exam:  General- No distress,  A&Ox3, pleasant frail female wearing oxygen ENT: No sinus tenderness, TM clear, pale nasal mucosa, no oral exudate,+ post nasal drip, no LAN Cardiac: S1, S2, regular rate and rhythm, no murmur Chest: No wheeze/ rales/ dullness; no accessory muscle use, no nasal flaring, no sternal retractions, crackles per bases Abd.: Soft Non-tender, non-distended, BS + Ext: No clubbing cyanosis, edema Neuro:  Deconditioned, MAE x 4, A&O x 3 Skin: No rashes, warm and dry Psych: normal mood and behavior   Assessment/Plan  ILD (interstitial lung disease) (HCC) Resolving flare Completed prednisone taper today Plan: FENO today>> unable to complete. We will give you a paper prescription forDoxycycline 100 mg BID x 7 days. You can fill this if you start coughing up secretions that are green or gray, or have fever or your bronchitis symptoms. Call the office if you need additional prednisone.  Re-refer to pulmonary rehab for help with deconditioning Boost meal supplements for weight gain. Try Delsym every 12 hours for cough ( Over the counter, non-sedating) We will give you a sample. Sips of water instead of throat clearing Sugar free hard candies for throat soothing( Jolly ranchers or Werthers) Avoid mint and menthol. Consider adding Claritin 10 mg once daily for  allergies Consider Flonase nasal spray 2 squirts each nostril once daily for allergies, post nasal drip. Schedule PFT's when you are better.>>  evaluate progression of disease Follow up with Dr. Marchelle Gearing at his first available Consider 6 minute walk evaluate progression of disease Consider follow up echo to evaluate stability/ progression  of PAH  Please contact office for sooner follow up if symptoms do not improve or worsen or seek emergency care     Chronic hypoxemic respiratory failure (HCC) Continue wearing oxygen at 2 L [place], keep saturations > 88-92%  Malnutrition of moderate degree Add meal supplements, Boost 1-2 times daily    Bevelyn Ngo, NP 03/22/2018  3:52 PM

## 2018-03-22 NOTE — Assessment & Plan Note (Addendum)
Resolving flare Completed prednisone taper today Plan: FENO today>> unable to complete. We will give you a paper prescription forDoxycycline 100 mg BID x 7 days. You can fill this if you start coughing up secretions that are green or gray, or have fever or your bronchitis symptoms. Call the office if you need additional prednisone.  Re-refer to pulmonary rehab for help with deconditioning Boost meal supplements for weight gain. Try Delsym every 12 hours for cough ( Over the counter, non-sedating) We will give you a sample. Sips of water instead of throat clearing Sugar free hard candies for throat soothing( Jolly ranchers or Werthers) Avoid mint and menthol. Consider adding Claritin 10 mg once daily for allergies Consider Flonase nasal spray 2 squirts each nostril once daily for allergies, post nasal drip. Schedule PFT's when you are better.>> evaluate progression of disease Follow up with Dr. Marchelle Gearingamaswamy at his first available Consider 6 minute walk evaluate progression of disease Consider follow up echo to evaluate stability/ progression  of PAH  Please contact office for sooner follow up if symptoms do not improve or worsen or seek emergency care

## 2018-03-22 NOTE — Assessment & Plan Note (Signed)
Add meal supplements, Boost 1-2 times daily

## 2018-03-22 NOTE — Patient Instructions (Addendum)
It is good to see you today.  I am glad you are feeling better. FENO today>> unable to complete. We will give you a paper prescription for z pack . You can fill this if you start coughing up secretions that are green or gray, or have fever or your bronchitis symptoms. Call the office if you need additional prednisone.  Re-refer to pulmonary rehab for help with deconditioning Boost meal supplements for weight gain. Try Delsym every 12 hours for cough ( Over the counter, non-sedating) We will give you a sample. Sips of water instead of throat clearing Sugar free hard candies for throat soothing( Jolly ranchers or Werthers) Avoid mint and menthol. Consider adding Claritin 10 mg once daily for allergies Consider Flonase nasal spray 2 squirts each nostril once daily for allergies, post nasal drip. Schedule PFT's when you are better. Follow up with Dr. Marchelle Gearingamaswamy at his first available  Please contact office for sooner follow up if symptoms do not improve or worsen or seek emergency care

## 2018-03-26 ENCOUNTER — Telehealth: Payer: Self-pay | Admitting: Internal Medicine

## 2018-03-26 ENCOUNTER — Other Ambulatory Visit: Payer: Medicare Other

## 2018-03-26 MED ORDER — PREDNISONE 10 MG PO TABS: ORAL_TABLET | ORAL | 0 refills | Status: AC

## 2018-03-26 NOTE — Telephone Encounter (Signed)
Call in Prednisone taper; 10 mg tablets: 4 tabs x 2 days, 3 tabs x 2 days, 2 tabs x 2 days 1 tab x 2 days then stop. She needs a follow up  with Marchelle Gearingamaswamy , Tammy or I after the second taper is completed.  Thanks

## 2018-03-26 NOTE — Telephone Encounter (Signed)
Spoke with patient. She is aware of the additional prednisone.   She has also been scheduled to follow up with TP on 4/16 at 430. Patient stated that her son has to bring her to her appts so if she needs to reschedule, she will call back.   Medication has been sent in, nothing else needed at time of call.

## 2018-03-26 NOTE — Telephone Encounter (Signed)
Spoke with patient. She was seen by SG on 03/22/18 and was given doxycycline 100mg  for 7 days and was advised to call back if she was not feeling better.   She is still taking the doxy but she finished the prednisone taper that was prescribed by TP on 03/14/18.   She wishes to have another round of prednisone sent in due to her still having the cough, SOB and feeling fatigued.   She wishes to use [facility] on [address].   Sarah, please advise on the additional prednisone. Thanks!

## 2018-03-27 ENCOUNTER — Ambulatory Visit (INDEPENDENT_AMBULATORY_CARE_PROVIDER_SITE_OTHER)
Admission: RE | Admit: 2018-03-27 | Discharge: 2018-03-27 | Disposition: A | Discharge status: Home / Self Care | Payer: Medicare Other | Source: Ambulatory Visit | Attending: Adult Health | Admitting: Adult Health

## 2018-03-27 ENCOUNTER — Telehealth (HOSPITAL_COMMUNITY): Payer: Self-pay

## 2018-03-27 DIAGNOSIS — J849 Interstitial pulmonary disease, unspecified: Secondary | ICD-10-CM

## 2018-03-27 DIAGNOSIS — R0602 Shortness of breath: Secondary | ICD-10-CM | POA: Diagnosis not present

## 2018-03-27 NOTE — Telephone Encounter (Signed)
Patients insurance is active and benefits verified through Medicare A/B - 20% co-insurance.  ° °Patients insurance is active and benefits verified through [employer]BS Supplement. ° °Patient is covered at 100%. °

## 2018-03-27 NOTE — Telephone Encounter (Signed)
Called and spoke with patient in regards to Pulmonary Rehab - Patient stated she is interested in the 10:30am exc class. Explained to patient that once a spot becomes available we will call to schedule. Patient verbally stated she understands.

## 2018-04-02 ENCOUNTER — Telehealth: Payer: Self-pay | Admitting: Internal Medicine

## 2018-04-02 NOTE — Telephone Encounter (Signed)
Notes recorded by Lowell BoutonJones, Jessica E, CMA on 03/30/2018 at 5:23 PM EDT Discussed with TP Pt saw Kandice RobinsonsSarah Groce NP on 03/22/18  Per the 03/27/18 phone note, prednisone was discussed with MR and pt is scheduled to see TP 4/16 ------  Notes recorded by Julio SicksParrett, Tammy S, NP on 03/30/2018 at 11:08 AM EDT HRCT chest showed progressive ILD changes  Will discuss in detail on return ov , needs follow up with Dr. Marchelle Gearingamaswamy  Please contact office for sooner follow up if symptoms do not improve or worsen or seek emergency care     Does Show CAD , plaque buildup , needs to discuss w/ PCP if further testing is indicated.   Patient is aware of her results. She has been scheduled to see MR on 04/10/18 during his ILD clinic. Patient also stated that [place] and would love for MR to recommend a good pulmonologist for her.

## 2018-04-03 ENCOUNTER — Ambulatory Visit: Payer: Medicare Other | Admitting: Adult Health

## 2018-04-03 ENCOUNTER — Telehealth (HOSPITAL_COMMUNITY): Payer: Self-pay

## 2018-04-03 NOTE — Telephone Encounter (Signed)
Called to see if patient was ready to schedule for the 10:30am exc PR class - Patient stated things have changed and [place]. Closed referral.

## 2018-04-10 ENCOUNTER — Encounter: Payer: Self-pay | Admitting: Internal Medicine

## 2018-04-10 ENCOUNTER — Ambulatory Visit (INDEPENDENT_AMBULATORY_CARE_PROVIDER_SITE_OTHER): Payer: Medicare Other | Admitting: Internal Medicine

## 2018-04-10 VITALS — BP 110/70 | HR 91 | Ht 60.0 in | Wt 103.2 lb

## 2018-04-10 DIAGNOSIS — J9611 Chronic respiratory failure with hypoxia: Secondary | ICD-10-CM | POA: Diagnosis not present

## 2018-04-10 DIAGNOSIS — J84112 Idiopathic pulmonary fibrosis: Secondary | ICD-10-CM

## 2018-04-10 DIAGNOSIS — Y93E6 Activity, residential relocation: Secondary | ICD-10-CM | POA: Diagnosis not present

## 2018-04-10 DIAGNOSIS — I2510 Atherosclerotic heart disease of native coronary artery without angina pectoris: Secondary | ICD-10-CM

## 2018-04-10 DIAGNOSIS — K449 Diaphragmatic hernia without obstruction or gangrene: Secondary | ICD-10-CM | POA: Diagnosis not present

## 2018-04-10 NOTE — Progress Notes (Signed)
Subjective:     Patient ID: Julie Page, female   DOB: 04-04-1951, 67 y.o.   MRN: [id number]  HPI    OV 04/28/2016  Chief Complaint  Patient presents with  . Follow-up    Pt here for HFU for pna and ILD. Pt states she is still occassionally having DOE, prod cough with clear mucus. Pt denies CP/tightness.    67 year old female presents with [place] on [facility] for evaluation of interstitial lung disease. History is gained from talking to her, her son review of the outside chart and old chart.  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 This building has mold in it and on and off ever since she started working that she started having respirations symptoms. In 2012 and 2013 she was evaluated by Dr. Levy Page in our institution. 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 There is also history of Julie Page and nset trace positive [name] and rheumatoid factor antibody. At that time pulmonary function test showed isolated low reduction in diffusion capacity that was mild. She only had dyspnea on climbing stairs at that point in time. She continued to be in stable health up until spring of 2017. During this time she continued to work in the building. In the interim she did follow-up with Dr. Zenovia Page at [facility] rheumatology. Pertinent lab tests are listed below. No systemic evidence of autoimmune disease was discovered and she was on serial follow-up.  Then most recently in April 2017 she had 2 weeks of respiratory exacerbation and then hospitalized for 1 week. CT scan of the chest at this point in time shows classic UIP pattern associated with diffuse groundglass opacity consistent with UIP flare. Significant progression since previous CT scan in 2012. She was discharged on steroid taper which she still is taking and 1 L oxygen. She is extremely physically  deconditioned. She spent some time in inpatient rehabilitation. She is now at home and is going to start home physical therapy. She notices that at room air at rest she would be 92% can be off oxygen for a few hours when she exerts she can drop into the 80s.  Echocardiogram at this point in time shows normal ejection fraction but elevated right ventricular and pulmonary artery systolic pressure to 40 mmHg.  She and her son have many questions pertaining to etiology, prognosis, treatment and outlook and disability application.    Autoimmune labs - Rheumatoid factor s 24 and in April 2013 was 59 in our health system. But with Dr. Nickola Page at [facility] was 32 and 102, in 2014 in 2015 respectively - [name] was positive at 1 in April 2017 in our health system and was trace positive at 2.0 with Dr. Nickola Page in September 2014. - No evidence that she's had CCP antibody tested for hypersensitivity panel tested -In September 2014 with Dr. Louann Page was trace positive but negative [name] scleroderma 70 SSB and double-stranded DNA antibodies.. Similar result in April 2017.  Waking desat test 185 feet x 3 laps on RA: desat to 86% in 1/2 to 1 lap and stopped    OV 05/24/2016  Chief Complaint  Patient presents with  . Follow-up    pt has not yet had R heart cath, just heard from cards yesterday.  pt has no complaints today.      Follow-up interstitial lung disease  provisional diagnosis of IPF. Presents with her son Julie Page discuss pulmonary function tests and autoimmune results  Last visit sent  CCP antibody and hypersensitivity pneumonitis profile and these were negative. We did pulmonary function test. She could not do her DLCO but FVC is 54% and a moderate severity. Currently she feels physically stronger but still very deconditioned from her admission. She is able to time 1 flight of stairs 1 L oxygen and desaturates to 88% especially when she coughs. Cough is still a problem for which  she is on palliative support with [employer]. She has now got a call from pulmonary rehabilitation and she plans to start that. She is working with cardiology to get an appointment to see Dr. Marca Anconaalton Page for right heart cath. She has not decided about anti-fibrotic therapy. She is very afraid of the side effects. She says she is very sensitive. She is worried about [name] because of sunscreen and she is worried about [name] because of potential cardiac risk. She feels that she wants to get stronger before she can try the medications. Although I cautioned her that IPF is unpredictable and can progress aggressively especially in the first after flare up. Her son Julie Page is in favor of taking her medications but she still not sure we discussed this extensively. She wants to consult with her other son   OV 08/31/2016  Chief Complaint  Patient presents with  . Follow-up    Pt states her breathing is doing well. Pt c/o prod cough with clear mucus. Pt denies CP/tightness and f/c/s.      Follow-up idiopathic pulmonary fibrosis diagnoses made June 2017  This is a routine follow-up. She is now on pulmonary rehabilitation. She uses 1 L of oxygen. Conditioning has improved. Shortness of breath has improved. But she is unable to go off oxygen. She continues to recheck anti-fibrotic therapy. In fact today she also rejected flu shot. In general she is averse to taking medications because of side effect profile. She will have her right heart catheterization on 09/12/2016. Her main symptomatic issue is cough that is occasional but very severe when it happens. She gets fatigue. This unpredictability to its onset. She has codeine cough syrup that helps but she is reluctant to take. Because of side effects. She's not interested in other options such as treatment of sinus drainage or vocal cord irritation or acid reflux therapy or over-the-counter Phenergan cough syrup   OV 11/23/2016  Chief Complaint  Patient  presents with  . Follow-up    Go over PFT results and 2 month follow up. Breathing has been of for the past few months.    Frail 67 year old female clinical diagnosis of IPF made May/June 2017. Does not desire for anti-fibrotic therapy or flu shot or research protocols  Overall she feels improved. She had a right heart catheterization September 2017 as documented below and it is normal. She uses 1 L of oxygen. This is at rest. She is not doing ADLs and was able to drive here. She is not sitting on the wheelchair anymore. Nevertheless overall she is fatigued and frail. Her main concern today is that her oxygen portable system she wants a larger tank and a smaller tank. She is worried that she might only have one option. She is declined a flu shot. She is declined participating in IPF registry.  OV 05/22/2017  Chief Complaint  Patient presents with  . Follow-up    pt c/o increased sometimes prod cough with clear mucus X1  week.     Frail 67 year old female with clinical diagnosis of IPF made summer 2017. Does not desire anti-fibrotic therapy a flu shot or research protocols.  Overall stable. She uses 1 L oxygen 2 L oxygen with exertion. She has just the oxygen based on the symptoms and self-monitoring pulse ox at meter. She is able to drive. In the last 6 months there has not been any admissions to the hospital or new medical diagnoses. Now for the past one week she feels symptoms have deteriorated with increasing cough and increased white color of sputum but no fever or chills or wheezing. She is okay taking prednisone and antibiotics as long this is a short duration and acceptable dose.   Walking desaturation test on 05/22/2017 185 feet x 3 laps:  did NOT desaturate. Rest pulse ox was 98%, final pulse ox was 92%. HR response 93/min at rest to 119/min at peak exertion.    OV 11/20/2017  Chief Complaint  Patient presents with  . Follow-up    Pt states that she has been doing okay. States that she  still has a lot of coughing fits also at night that keeps her awake and also has a lot of SOB mainly with exertion. Denies any CP.     66 year old female with clinical diagnosis of IPF made summer 2017.  On observation therapy with supportive care because she does not desire anti-fibrotic therapy or research protocols  This is a routine 78-month follow-up.  In the interim she continues to be on basic supportive care.  She is again expressing a desire not to take anti-fibrotic therapy or take flu shot for her IPF.  She uses oxygen at night and occasionally with exertion.  She agrees that she has progressive deconditioning.  She did see primary care physician for depression and was prescribed antidepressant.  I personally reviewed the note and confirmed it.  However because of her antipathy towards medication she has not started it.  She is reporting slow worsening of cough and shortness of breath.  The cough is more dominant than the shortness of breath.  She believes that codeine cough syrup can help her but she only wants a low dose and for occasional sparing use she wants a prescription.  She agrees to easy tachycardia and feels this is also limiting her.  In fact when we walked her today she started to labs and got very tachycardic.  She admits to progressive deconditioning.  Last EKG was in 2017 sinus tachycardia.  She did not have PFTs this visit.  King's interstitial lung disease question as documented below.  Walking desaturation test 185 feet x3 laps on room air with a forehead probe today November 20, 2017: She only walked 2 laps slowly and then stopped due to fatigue.  Her heart rate jumped from 102 at rest 228 at peak exertion.  Pulse ox dropped from 97% to 90% but did not meet significance for the oxygenation    03/14/2018  Acute OV : ILD  Pt presents for an acute office visit. She is accompanied by her son. Says that her breathing has gotten worse for last few days . She feels fatigued and  winded with minimal activity . Feels for last 3-6 months breathing has not been doing as well.  More dyspnea with activity, wears out easily . Activity tolerance has been declining .  Wt is trending down .-10lbs over last 6 months .she is normally on 1 l/m oxygen and takes on/off but now  wearing 2l/m 24/7 .  On arrival today O2 sats 95% on room air .  Cough is dry and is at baseline with no flare . No fever, discolored mucus, urinary sx, chest pain, orthopnea, edema or hemoptysis . No calf pain .  She has presumed ILD with changes on CT chest , dating back to 2012.  She has declined antifibotics therapy .  She is very deconditioned. Says son helps her at home .   03/22/2018 Follow up OV for worsening dyspnea/ suspected progression of  ILD. Pt was seen by Rubye Oaks, NP on 03/14/2018 for an Acute OV for worsening dyspnea and fatigue with minimal activity.She told Tammy she has had a decline over the last 3-6 months. Her weight  has been  trending down .-10lbs over last 6 months .She is normally on 1 l/m oxygen and she  takes it on/off but had had to increase her flow  wearing 2l/m 24/7 .She stated her Cough was dry and was at baseline with no flare . No fever, discolored mucus, urinary sx, chest pain, orthopnea, edema or hemoptysis . No calf pain . She is very deconditioned and at the time of the OV she refused antibiotic therapy and hospital admission  for further workup , to check for dehydration, give IVF , look for signs of infection , etc.  She declines hospital admission , despite explanation of benefits of monitoring and access to test/meds. Plan was for pred taper, Mucinex DM, CXR and oxygen. Tammy emphasized that any worsening she was to present to the ED.CXR results noted below. Pt has an HRCT scheduled for 03/27/2018 to further evaluate for progression of ILD vs infectius process.  Pt. Presents for follow up. She states she took her last prednisone prescribed last by [employer]  week today.  She states she is feeling better. She feels stronger than she did last week. Her cough is non-productive. She states she did have some chest tightness this morning and is  concerned that she may be starting a bronchitis flare. She refused antibiotics last week when she saw Tammy. She denies fever, chest pain, orthopnea or hemoptysis. She is compliant with her oxygen . Weight is up 3 pounds in the last week.  Test results: CXR 03/22/2018>>  Widespread interstitial lung disease, progressive over time, but without volume loss. No discernible infiltrate, collapse or effusion. As noted above, low level inflammatory changes could be hidden within the chronic findings. Underlying etiology may be hypersensitivity pneumonitis rather than UIP.  Autoimmune labs - Rheumatoid factor s 24 and in April 2013 was 59 in our health system. But with [facility]. Nickola Page at [facility] was 67 and 102, in 2014 in 2015 respectively - [name] was positive at 1 in April 2017 in our health system and was trace positive at 2.0 with Dr. Nickola Page in September 2014. - No evidence that she's had CCP antibody tested for hypersensitivity panel tested -In September 2014 with Dr. Louann Page was trace positive but negative [name] scleroderma 70 SSB and double-stranded DNA antibodies.. Similar result in April 2017.  Waking desat test 185 feet x 3 laps on RA: desat to 86% in 1/2 to 1 lap and stopped   OV 04/10/2018  Chief Complaint  Patient presents with  . Follow-up    Reports cough has improved and is now wearing O2 continous. 1L while resting and 2L with activity.     Julie Page returns for followup. Son Julie Slick with her. I personally However  end of March 2019 she saw my nurse practitioner acutely. At this time was treated with a prednisone burst. Diagnosis acute bronchitis versus IPF flare. According to the son this seemed to help her. But not fully. In addition the son thought the home environment which had  smellsfrom a neighbor apartment was not good and equal to no avail. The combination of the 2up the patient. But due to lack of full improvement in symptoms saw as practitioner again early April 2019 given a second course of prednisone which they do not think fully helped her. Since then she's been using oxygen continuously although evaluation shows she desaturated when she exerted. Nevertheless this desaturation with exertion is a progressive sign. She did have high resolution CT chest after these 2 visits on 03/27/2018 that I personally visualized. I agree with the radiologist this is definite of UIP with significant progression in the last 2 years. We have had multiple discussions with the patient in the past about Prilosec and anti-fibrotic's but she is not interested. Again had a long conversation with her about the benefits and limitations but again she is not interested. We again discussed prognosis and the possible and likely role of alleviation with anti-fibrotic's but she still wants to think about all these things. In addition that she had a new information that there was mold exposure in workplace many years ago and overall the son feels that she is worse when she is in her apartment then she's at other locations particularly in [place] son lives. Therefore on basis of all this next week she's relocating to [place], [place]. She wants and recommendations for pulmonologist there. He also had questions about prognosis. Of note, there is no evidence of chronic hypersensitivity pneumonitis on the CT scan reported by the radiologist such as air trapping.   Results for MARIAELENA, CADE (MRN [id number]) as of 11/20/2017 11:15  Ref. Range 05/12/2016 13:56 11/23/2016 14:43  FVC-Pre Latest Units: L 1.46 1.35  FVC-%Pred-Pre Latest Units: % 54 50  .   IMPRESSION: HRCT 03/27/18 1. Appearance of the lungs is indicative of interstitial lung disease with a typical  usual interstitial pneumonia (UIP) CT pattern, as detailed above. There has been considerable progression compared to the prior study from 03/25/2016. 2. Aortic atherosclerosis, in addition to left main and 3 vessel coronary artery disease. Please note that although the presence of coronary artery calcium documents the presence of coronary artery disease, the severity of this disease and any potential stenosis cannot be assessed on this non-gated CT examination. Assessment for potential risk factor modification, dietary therapy or pharmacologic therapy may be warranted, if clinically indicated. 3. Moderate-sized hiatal hernia.  Aortic Atherosclerosis (ICD10-I70.0).   Electronically Signed   By: Trudie Reed M.D.   On: 03/28/2018 12:25   has a past medical history of Acute respiratory failure with hypoxia (HCC), Anemia, CAP (community acquired pneumonia), Chronic diastolic heart failure (HCC), Hypercholesteremia, Hypoxia, ILD (interstitial lung disease) (HCC), Leukocytosis, Malnutrition of moderate degree (HCC), Palpitations, Protein calorie malnutrition (HCC), Pulmonary HTN (HCC), Raynaud's syndrome, and SIRS (systemic inflammatory response syndrome) (HCC).   reports that she has never smoked. She has never used smokeless tobacco.  Past Surgical History:  Procedure Laterality Date  . CARDIAC CATHETERIZATION N/A 09/12/2016   Procedure: Right Heart Cath;  Surgeon: Laurey Morale, MD;  Location: [facility] INVASIVE CV LAB;  Service: Cardiovascular;  Laterality: N/A;  . TUBAL LIGATION  03/30/1978    Allergies  Allergen  Reactions  . Purell Instant Hand [Alcohol] Cough  . Azithromycin Other (See Comments)    Burning and itching at IV infusion site  . Spiriva Handihaler [Tiotropium Bromide Monohydrate] Other (See Comments)    "Made me crazy"    Immunization History  Administered Date(s) Administered  . PPD Test 03/31/2016    Family History  Problem Relation Age of Onset  . Heart  disease Mother   . Heart disease Maternal Grandfather   . Heart disease Father      Current Outpatient Medications:  .  buPROPion (WELLBUTRIN XL) 150 MG 24 hr tablet, Take 1 tablet (150 mg total) by mouth daily., Disp: 90 tablet, Rfl: 3 .  Cholecalciferol (VITAMIN D3 PO), Take 100 Units by mouth daily., Disp: , Rfl:  .  Digestive Enzymes (DIGESTIVE ENZYME PO), Take 1 tablet by mouth 3 (three) times daily. , Disp: , Rfl:  .  doxycycline (VIBRA-TABS) 100 MG tablet, Take 1 tablet (100 mg total) by mouth 2 (two) times daily., Disp: 14 tablet, Rfl: 0 .  Magnesium 100 MG CAPS, Take 400 mg by mouth every evening., Disp: , Rfl:  .  Omega-3 Fatty Acids (FISH OIL PO), Take 1,500 mg by mouth daily., Disp: , Rfl:  .  OVER THE COUNTER MEDICATION, Take 2 tablets by mouth daily. Serra Peptase protein Digestive Enzyme supplement, Disp: , Rfl:  .  promethazine-codeine (PHENERGAN WITH CODEINE) 6.25-10 MG/5ML syrup, Take 5 mLs by mouth every 6 (six) hours as needed for cough., Disp: 200 mL, Rfl: 0 .  TURMERIC CURCUMIN PO, Take 1,100 mg by mouth daily as needed. Inflammation, Disp: , Rfl:  .  vitamin C (ASCORBIC ACID) 500 MG tablet, Take 500 mg by mouth daily., Disp: , Rfl:  .  predniSONE (DELTASONE) 10 MG tablet, 4 tabs for 2 days, then 3 tabs for 2 days, 2 tabs for 2 days, then 1 tab for 2 days, then stop (Patient not taking: Reported on 04/10/2018), Disp: 20 tablet, Rfl: 0   Review of Systems     Objective:   Physical Exam Vitals:   04/10/18 1541  BP: 110/70  Pulse: 91  SpO2: 97%  Weight: 103 lb 3.2 oz (46.8 kg)  Height: 5' (1.524 m)    Estimated body mass index is 20.15 kg/m as calculated from the following:   Height as of this encounter: 5' (1.524 m).   Weight as of this encounter: 103 lb 3.2 oz (46.8 kg).   General Appearance:    Looks frail, emaciated  Head:    Normocephalic, without obvious abnormality, atraumatic  Eyes:    PERRL - yes, conjunctiva/corneas - clear5      Ears:    Normal  external ear canals, both ears  Nose:   NG tube - no but has O2  Throat:  ETT TUBE - no , OG tube - no  Neck:   Supple,  No enlargement/tenderness/nodules     Lungs:     Clear to auscultation bilaterally but has mild basal crackles  Chest wall:    No deformity  Heart:    S1 and S2 normal, no murmur, CVP - no.  Pressors - no  Abdomen:     Soft, no masses, no organomegaly  Genitalia:    Not done  Rectal:   not done  Extremities:   Extremities- intact     Skin:   Intact in exposed areas . Sacral area - no decub per report     Neurologic:   Sedation -  none -> RASS - na . Moves all 4s - yes. CAM-ICU - neg . Orientation - x 3+         Assessment:       ICD-10-CM   1. Chronic hypoxemic respiratory failure (HCC) J96.11   2. IPF (idiopathic pulmonary fibrosis) (HCC) J84.112   3. Hiatal hernia K44.9   4. Residential relocation Y93.E6   5. Coronary artery calcification seen on CAT scan I25.10        Plan:    Based on CT and age and blood work tis is IPF  Though based on prior hx of mold exposure and some variation in how you feel at your apartment (worse) v in RTP (better) there might be chronic HP  Too risky to biopsy  Overall - You have progressed significantly in 2 years  You have hiatal hernia that can be making pulmonary fibrosis worse - you are not on Rx for this as of now  You have coronary artery calcification on CT     Plan Wear o2 all the time - get finger pulse ox and keep it > 88%  Stop fish oil  Start prilosec 20mg  daily on empty stomach  I strongly recommend trial of anti-fibrotic treatment atleast at lower dose given your low weight; choices are ofev  V esbriet   - respect desire to refuse despite progression   - recommend you discuss this with new pulmonary Pulmonary MD I can recommend either [facility] - 60 min from [place] OR  [facility] - Dr Blinda Leatherwood who I worked with and is solid -  I have reached out to him to see if he or someone he  knows is willing to take you on  You can also discuss chronic daily prednisone in case they think this is chronic HP   You also might need cardiac stress test- recommend you discuss with new PCP  Follolwup As neeeed; best wishes for the future   > 50% of this > 40 min visit spent in face to face counseling or/and coordination of care     Dr. Kalman Shan, M.D., [facility].C.P Pulmonary and Critical Care Medicine Staff Physician, [facility] Director - Interstitial Lung Disease  Program  Pulmonary Fibrosis [facility] at [facility] [place], [place], [address]  Pager: [phone], If no answer or between  15:00h - 7:00h: call [phone] Telephone: [phone]

## 2018-04-10 NOTE — Patient Instructions (Addendum)
ICD-10-CM   1. Chronic hypoxemic respiratory failure (HCC) J96.11   2. IPF (idiopathic pulmonary fibrosis) (HCC) J84.112   3. Hiatal hernia K44.9   4. Residential relocation Y93.E6   5. Coronary artery calcification seen on CAT scan I25.10   Based on CT and age and blood work tis is IPF  Though based on prior hx of mold exposure and some variation in how you feel at your apartment (worse) v in RTP (better) there might be chronic HP  Too risky to biopsy  Overall - You have progressed significantly in 2 years  You have hiatal hernia that can be making pulmonary fibrosis worse - you are not on Rx for this as of now  You have coronary artery calcification on CT     Plan Wear o2 all the time - get finger pulse ox and keep it > 88%  Stop fish oil  Start prilosec 20mg  daily on empty stomach  I strongly recommend trial of anti-fibrotic treatment atleast at lower dose given your low weight; choices are ofev  V esbriet   - respect desire to refuse despite progression   - recommend you discuss this with new pulmonary Pulmonary MD I can recommend either [facility] [place]  [facility] - Dr Blinda LeatherwoodPathak who I worked with and is solid -  I have reached out to him to see if he or someone he knows is willing to take you on  You can also discuss chronic daily prednisone in case they think this is chronic HP   You also might need cardiac stress test- recommend you discuss with new PCP  Follolwup As neeeed; best wishes for the future

## 2018-05-02 DIAGNOSIS — M9902 Segmental and somatic dysfunction of thoracic region: Secondary | ICD-10-CM | POA: Diagnosis not present

## 2018-05-02 DIAGNOSIS — M9901 Segmental and somatic dysfunction of cervical region: Secondary | ICD-10-CM | POA: Diagnosis not present

## 2018-05-02 DIAGNOSIS — M542 Cervicalgia: Secondary | ICD-10-CM | POA: Diagnosis not present

## 2018-05-02 DIAGNOSIS — M5030 Other cervical disc degeneration, unspecified cervical region: Secondary | ICD-10-CM | POA: Diagnosis not present

## 2018-05-08 DIAGNOSIS — M9902 Segmental and somatic dysfunction of thoracic region: Secondary | ICD-10-CM | POA: Diagnosis not present

## 2018-05-08 DIAGNOSIS — M542 Cervicalgia: Secondary | ICD-10-CM | POA: Diagnosis not present

## 2018-05-08 DIAGNOSIS — M9901 Segmental and somatic dysfunction of cervical region: Secondary | ICD-10-CM | POA: Diagnosis not present

## 2018-05-08 DIAGNOSIS — M5030 Other cervical disc degeneration, unspecified cervical region: Secondary | ICD-10-CM | POA: Diagnosis not present

## 2018-05-10 DIAGNOSIS — M9902 Segmental and somatic dysfunction of thoracic region: Secondary | ICD-10-CM | POA: Diagnosis not present

## 2018-05-10 DIAGNOSIS — M9901 Segmental and somatic dysfunction of cervical region: Secondary | ICD-10-CM | POA: Diagnosis not present

## 2018-05-10 DIAGNOSIS — M542 Cervicalgia: Secondary | ICD-10-CM | POA: Diagnosis not present

## 2018-05-10 DIAGNOSIS — M5030 Other cervical disc degeneration, unspecified cervical region: Secondary | ICD-10-CM | POA: Diagnosis not present

## 2018-05-15 DIAGNOSIS — M5030 Other cervical disc degeneration, unspecified cervical region: Secondary | ICD-10-CM | POA: Diagnosis not present

## 2018-05-15 DIAGNOSIS — M542 Cervicalgia: Secondary | ICD-10-CM | POA: Diagnosis not present

## 2018-05-15 DIAGNOSIS — M9902 Segmental and somatic dysfunction of thoracic region: Secondary | ICD-10-CM | POA: Diagnosis not present

## 2018-05-15 DIAGNOSIS — M9901 Segmental and somatic dysfunction of cervical region: Secondary | ICD-10-CM | POA: Diagnosis not present

## 2018-05-22 DIAGNOSIS — M5030 Other cervical disc degeneration, unspecified cervical region: Secondary | ICD-10-CM | POA: Diagnosis not present

## 2018-05-22 DIAGNOSIS — M542 Cervicalgia: Secondary | ICD-10-CM | POA: Diagnosis not present

## 2018-05-22 DIAGNOSIS — M9902 Segmental and somatic dysfunction of thoracic region: Secondary | ICD-10-CM | POA: Diagnosis not present

## 2018-05-22 DIAGNOSIS — M9901 Segmental and somatic dysfunction of cervical region: Secondary | ICD-10-CM | POA: Diagnosis not present

## 2018-05-31 ENCOUNTER — Telehealth: Payer: Self-pay | Admitting: Internal Medicine

## 2018-05-31 ENCOUNTER — Telehealth: Payer: Self-pay | Admitting: *Deleted

## 2018-05-31 DIAGNOSIS — M5030 Other cervical disc degeneration, unspecified cervical region: Secondary | ICD-10-CM | POA: Diagnosis not present

## 2018-05-31 DIAGNOSIS — M9901 Segmental and somatic dysfunction of cervical region: Secondary | ICD-10-CM | POA: Diagnosis not present

## 2018-05-31 DIAGNOSIS — M542 Cervicalgia: Secondary | ICD-10-CM | POA: Diagnosis not present

## 2018-05-31 DIAGNOSIS — M9902 Segmental and somatic dysfunction of thoracic region: Secondary | ICD-10-CM | POA: Diagnosis not present

## 2018-05-31 NOTE — Telephone Encounter (Signed)
Received request for Medical Records from patient to be sent to Dr. Pierce CraneSahba Maani, Pulmonologist d/t move, fax to [phone]; forwarded to [place] for email/scan/SLS 06/13

## 2018-05-31 NOTE — Telephone Encounter (Signed)
Dr.Allred signed & completed Parking Placard. Mailed back to patients home address.

## 2018-06-05 DIAGNOSIS — J849 Interstitial pulmonary disease, unspecified: Secondary | ICD-10-CM | POA: Diagnosis not present

## 2018-06-05 DIAGNOSIS — R0600 Dyspnea, unspecified: Secondary | ICD-10-CM | POA: Diagnosis not present

## 2018-06-07 DIAGNOSIS — M9901 Segmental and somatic dysfunction of cervical region: Secondary | ICD-10-CM | POA: Diagnosis not present

## 2018-06-07 DIAGNOSIS — M5030 Other cervical disc degeneration, unspecified cervical region: Secondary | ICD-10-CM | POA: Diagnosis not present

## 2018-06-07 DIAGNOSIS — M542 Cervicalgia: Secondary | ICD-10-CM | POA: Diagnosis not present

## 2018-06-07 DIAGNOSIS — M9902 Segmental and somatic dysfunction of thoracic region: Secondary | ICD-10-CM | POA: Diagnosis not present

## 2018-06-11 ENCOUNTER — Telehealth: Payer: Self-pay | Admitting: Internal Medicine

## 2018-06-11 DIAGNOSIS — J84112 Idiopathic pulmonary fibrosis: Secondary | ICD-10-CM

## 2018-06-11 NOTE — Telephone Encounter (Signed)
Called and spoke with patient, she states that [place] and is needing to be seen by a pulmonologist closer than the one she was referred to in [place]. She is asking that she is referred to Dr. Gust RungKadumpalli with [facility]. This doctor [place] located in [place], [place].  Phone # [phone]  Fax# [phone]    MR please advise, thank you.

## 2018-06-11 NOTE — Telephone Encounter (Signed)
Referral placed. Pt aware. Nothing further needed. 

## 2018-06-11 NOTE — Telephone Encounter (Signed)
That is fine. Please do it

## 2018-06-12 DIAGNOSIS — M542 Cervicalgia: Secondary | ICD-10-CM | POA: Diagnosis not present

## 2018-06-12 DIAGNOSIS — M5030 Other cervical disc degeneration, unspecified cervical region: Secondary | ICD-10-CM | POA: Diagnosis not present

## 2018-06-12 DIAGNOSIS — M9901 Segmental and somatic dysfunction of cervical region: Secondary | ICD-10-CM | POA: Diagnosis not present

## 2018-06-12 DIAGNOSIS — M9902 Segmental and somatic dysfunction of thoracic region: Secondary | ICD-10-CM | POA: Diagnosis not present

## 2018-06-14 ENCOUNTER — Telehealth: Payer: Self-pay | Admitting: Internal Medicine

## 2018-06-14 NOTE — Telephone Encounter (Signed)
Received a form from [facility] that is needing documentation for oxygen coverage based on O2 study obtained during exercise.  It needs to show testing at rest without oxygen on room air, testing during exercise without oxygen on room air, and testing during exercise with oxygen applied and all tests must be performed within the same testing session.  Pt needs to be scheduled for a qualifying walk to have this performed.    Called pt stating this information and pt stated she no longer lives in [place] that she lives in [place].  Pt states she now wears oxygen all the time. Pt states she wears 2L O2 at rest and goes up to either 3-4L with activity.  Called [facility] and spoke with Mickie Bailosha stating [place] no longer lives in [place] and is unable to come to the office to have the recertification done for Medicare.  Per Drake Leachosha, Donna Strickland is the one working on this so she will transfer me to [place].  Transferred to [facility] LeashDonna but unable to speak to her.  Left a detailed message for Lupita LeashDonna stating the information above and also stated in there for her to return call so we can get this taken care of for pt.

## 2018-06-15 NOTE — Telephone Encounter (Signed)
Called and spoke with Julie Page at [facility] [phone]. [phone] Informed her of the information below that  Julie Page was inquiring about. Advised Julie Page that Julie Page is currently in clinic at his time. Julie Page stated that she will call the patient now, since [place] no longer [place] area. Julie Page will call Julie Page back on Monday with additional information.  Routing message to [place] for review.

## 2018-06-19 DIAGNOSIS — M542 Cervicalgia: Secondary | ICD-10-CM | POA: Diagnosis not present

## 2018-06-19 DIAGNOSIS — M5030 Other cervical disc degeneration, unspecified cervical region: Secondary | ICD-10-CM | POA: Diagnosis not present

## 2018-06-19 DIAGNOSIS — M9902 Segmental and somatic dysfunction of thoracic region: Secondary | ICD-10-CM | POA: Diagnosis not present

## 2018-06-19 DIAGNOSIS — M9901 Segmental and somatic dysfunction of cervical region: Secondary | ICD-10-CM | POA: Diagnosis not present

## 2018-06-19 NOTE — Telephone Encounter (Signed)
Called [facility] to see if everything was able to be taken care of for pt.  Per Lupita Leashonna, pt is going to be seeing Dr. Glendon AxeKavitha Kadumpalli [place].  Nothing further needed.

## 2018-06-28 DIAGNOSIS — M9901 Segmental and somatic dysfunction of cervical region: Secondary | ICD-10-CM | POA: Diagnosis not present

## 2018-06-28 DIAGNOSIS — M542 Cervicalgia: Secondary | ICD-10-CM | POA: Diagnosis not present

## 2018-06-28 DIAGNOSIS — M9902 Segmental and somatic dysfunction of thoracic region: Secondary | ICD-10-CM | POA: Diagnosis not present

## 2018-06-28 DIAGNOSIS — M5030 Other cervical disc degeneration, unspecified cervical region: Secondary | ICD-10-CM | POA: Diagnosis not present

## 2018-07-17 DIAGNOSIS — R0600 Dyspnea, unspecified: Secondary | ICD-10-CM | POA: Diagnosis not present

## 2018-07-17 DIAGNOSIS — J849 Interstitial pulmonary disease, unspecified: Secondary | ICD-10-CM | POA: Diagnosis not present

## 2018-07-17 DIAGNOSIS — J84112 Idiopathic pulmonary fibrosis: Secondary | ICD-10-CM | POA: Diagnosis not present

## 2018-07-17 DIAGNOSIS — Z6820 Body mass index (BMI) 20.0-20.9, adult: Secondary | ICD-10-CM | POA: Diagnosis not present

## 2018-07-19 DIAGNOSIS — M9902 Segmental and somatic dysfunction of thoracic region: Secondary | ICD-10-CM | POA: Diagnosis not present

## 2018-07-19 DIAGNOSIS — M5030 Other cervical disc degeneration, unspecified cervical region: Secondary | ICD-10-CM | POA: Diagnosis not present

## 2018-07-19 DIAGNOSIS — M542 Cervicalgia: Secondary | ICD-10-CM | POA: Diagnosis not present

## 2018-07-19 DIAGNOSIS — M9901 Segmental and somatic dysfunction of cervical region: Secondary | ICD-10-CM | POA: Diagnosis not present

## 2018-07-26 DIAGNOSIS — M542 Cervicalgia: Secondary | ICD-10-CM | POA: Diagnosis not present

## 2018-07-26 DIAGNOSIS — M9902 Segmental and somatic dysfunction of thoracic region: Secondary | ICD-10-CM | POA: Diagnosis not present

## 2018-07-26 DIAGNOSIS — M5030 Other cervical disc degeneration, unspecified cervical region: Secondary | ICD-10-CM | POA: Diagnosis not present

## 2018-07-26 DIAGNOSIS — M9901 Segmental and somatic dysfunction of cervical region: Secondary | ICD-10-CM | POA: Diagnosis not present

## 2018-08-24 DIAGNOSIS — J84112 Idiopathic pulmonary fibrosis: Secondary | ICD-10-CM | POA: Diagnosis not present

## 2018-08-24 DIAGNOSIS — Z66 Do not resuscitate: Secondary | ICD-10-CM | POA: Diagnosis not present

## 2018-08-24 DIAGNOSIS — Z515 Encounter for palliative care: Secondary | ICD-10-CM | POA: Diagnosis not present

## 2018-08-25 DIAGNOSIS — K449 Diaphragmatic hernia without obstruction or gangrene: Secondary | ICD-10-CM | POA: Diagnosis not present

## 2018-08-25 DIAGNOSIS — Z66 Do not resuscitate: Secondary | ICD-10-CM | POA: Diagnosis not present

## 2018-08-25 DIAGNOSIS — J84112 Idiopathic pulmonary fibrosis: Secondary | ICD-10-CM | POA: Diagnosis not present

## 2018-08-27 DIAGNOSIS — K449 Diaphragmatic hernia without obstruction or gangrene: Secondary | ICD-10-CM | POA: Diagnosis not present

## 2018-08-27 DIAGNOSIS — Z66 Do not resuscitate: Secondary | ICD-10-CM | POA: Diagnosis not present

## 2018-08-27 DIAGNOSIS — J84112 Idiopathic pulmonary fibrosis: Secondary | ICD-10-CM | POA: Diagnosis not present

## 2018-08-28 DIAGNOSIS — Z66 Do not resuscitate: Secondary | ICD-10-CM | POA: Diagnosis not present

## 2018-08-28 DIAGNOSIS — K449 Diaphragmatic hernia without obstruction or gangrene: Secondary | ICD-10-CM | POA: Diagnosis not present

## 2018-08-28 DIAGNOSIS — J84112 Idiopathic pulmonary fibrosis: Secondary | ICD-10-CM | POA: Diagnosis not present

## 2018-08-29 DIAGNOSIS — Z66 Do not resuscitate: Secondary | ICD-10-CM | POA: Diagnosis not present

## 2018-08-29 DIAGNOSIS — J84112 Idiopathic pulmonary fibrosis: Secondary | ICD-10-CM | POA: Diagnosis not present

## 2018-08-29 DIAGNOSIS — K449 Diaphragmatic hernia without obstruction or gangrene: Secondary | ICD-10-CM | POA: Diagnosis not present

## 2018-09-04 DIAGNOSIS — K449 Diaphragmatic hernia without obstruction or gangrene: Secondary | ICD-10-CM | POA: Diagnosis not present

## 2018-09-04 DIAGNOSIS — J84112 Idiopathic pulmonary fibrosis: Secondary | ICD-10-CM | POA: Diagnosis not present

## 2018-09-04 DIAGNOSIS — Z66 Do not resuscitate: Secondary | ICD-10-CM | POA: Diagnosis not present

## 2018-09-07 DIAGNOSIS — K449 Diaphragmatic hernia without obstruction or gangrene: Secondary | ICD-10-CM | POA: Diagnosis not present

## 2018-09-07 DIAGNOSIS — Z66 Do not resuscitate: Secondary | ICD-10-CM | POA: Diagnosis not present

## 2018-09-07 DIAGNOSIS — J84112 Idiopathic pulmonary fibrosis: Secondary | ICD-10-CM | POA: Diagnosis not present

## 2018-09-12 DIAGNOSIS — Z66 Do not resuscitate: Secondary | ICD-10-CM | POA: Diagnosis not present

## 2018-09-12 DIAGNOSIS — K449 Diaphragmatic hernia without obstruction or gangrene: Secondary | ICD-10-CM | POA: Diagnosis not present

## 2018-09-12 DIAGNOSIS — J84112 Idiopathic pulmonary fibrosis: Secondary | ICD-10-CM | POA: Diagnosis not present

## 2018-09-14 DIAGNOSIS — J84112 Idiopathic pulmonary fibrosis: Secondary | ICD-10-CM | POA: Diagnosis not present

## 2018-09-14 DIAGNOSIS — Z66 Do not resuscitate: Secondary | ICD-10-CM | POA: Diagnosis not present

## 2018-09-14 DIAGNOSIS — K449 Diaphragmatic hernia without obstruction or gangrene: Secondary | ICD-10-CM | POA: Diagnosis not present

## 2018-09-18 DIAGNOSIS — J84112 Idiopathic pulmonary fibrosis: Secondary | ICD-10-CM | POA: Diagnosis not present

## 2018-09-18 DIAGNOSIS — K449 Diaphragmatic hernia without obstruction or gangrene: Secondary | ICD-10-CM | POA: Diagnosis not present

## 2018-09-18 DIAGNOSIS — Z66 Do not resuscitate: Secondary | ICD-10-CM | POA: Diagnosis not present

## 2018-09-21 DIAGNOSIS — Z66 Do not resuscitate: Secondary | ICD-10-CM | POA: Diagnosis not present

## 2018-09-21 DIAGNOSIS — J84112 Idiopathic pulmonary fibrosis: Secondary | ICD-10-CM | POA: Diagnosis not present

## 2018-09-21 DIAGNOSIS — K449 Diaphragmatic hernia without obstruction or gangrene: Secondary | ICD-10-CM | POA: Diagnosis not present

## 2018-09-25 DIAGNOSIS — K449 Diaphragmatic hernia without obstruction or gangrene: Secondary | ICD-10-CM | POA: Diagnosis not present

## 2018-09-25 DIAGNOSIS — Z66 Do not resuscitate: Secondary | ICD-10-CM | POA: Diagnosis not present

## 2018-09-25 DIAGNOSIS — J84112 Idiopathic pulmonary fibrosis: Secondary | ICD-10-CM | POA: Diagnosis not present

## 2018-10-02 DIAGNOSIS — J84112 Idiopathic pulmonary fibrosis: Secondary | ICD-10-CM | POA: Diagnosis not present

## 2018-10-02 DIAGNOSIS — Z66 Do not resuscitate: Secondary | ICD-10-CM | POA: Diagnosis not present

## 2018-10-02 DIAGNOSIS — K449 Diaphragmatic hernia without obstruction or gangrene: Secondary | ICD-10-CM | POA: Diagnosis not present

## 2018-10-05 DIAGNOSIS — K449 Diaphragmatic hernia without obstruction or gangrene: Secondary | ICD-10-CM | POA: Diagnosis not present

## 2018-10-05 DIAGNOSIS — J84112 Idiopathic pulmonary fibrosis: Secondary | ICD-10-CM | POA: Diagnosis not present

## 2018-10-05 DIAGNOSIS — Z66 Do not resuscitate: Secondary | ICD-10-CM | POA: Diagnosis not present

## 2018-10-10 DIAGNOSIS — K449 Diaphragmatic hernia without obstruction or gangrene: Secondary | ICD-10-CM | POA: Diagnosis not present

## 2018-10-10 DIAGNOSIS — J84112 Idiopathic pulmonary fibrosis: Secondary | ICD-10-CM | POA: Diagnosis not present

## 2018-10-10 DIAGNOSIS — Z66 Do not resuscitate: Secondary | ICD-10-CM | POA: Diagnosis not present

## 2018-10-12 DIAGNOSIS — K449 Diaphragmatic hernia without obstruction or gangrene: Secondary | ICD-10-CM | POA: Diagnosis not present

## 2018-10-12 DIAGNOSIS — J84112 Idiopathic pulmonary fibrosis: Secondary | ICD-10-CM | POA: Diagnosis not present

## 2018-10-12 DIAGNOSIS — Z66 Do not resuscitate: Secondary | ICD-10-CM | POA: Diagnosis not present

## 2018-10-16 DIAGNOSIS — K449 Diaphragmatic hernia without obstruction or gangrene: Secondary | ICD-10-CM | POA: Diagnosis not present

## 2018-10-16 DIAGNOSIS — J84112 Idiopathic pulmonary fibrosis: Secondary | ICD-10-CM | POA: Diagnosis not present

## 2018-10-16 DIAGNOSIS — Z66 Do not resuscitate: Secondary | ICD-10-CM | POA: Diagnosis not present

## 2018-10-19 DIAGNOSIS — J84112 Idiopathic pulmonary fibrosis: Secondary | ICD-10-CM | POA: Diagnosis not present

## 2018-10-19 DIAGNOSIS — Z66 Do not resuscitate: Secondary | ICD-10-CM | POA: Diagnosis not present

## 2018-10-19 DIAGNOSIS — K449 Diaphragmatic hernia without obstruction or gangrene: Secondary | ICD-10-CM | POA: Diagnosis not present

## 2018-10-23 DIAGNOSIS — J84112 Idiopathic pulmonary fibrosis: Secondary | ICD-10-CM | POA: Diagnosis not present

## 2018-10-23 DIAGNOSIS — Z66 Do not resuscitate: Secondary | ICD-10-CM | POA: Diagnosis not present

## 2018-10-23 DIAGNOSIS — K449 Diaphragmatic hernia without obstruction or gangrene: Secondary | ICD-10-CM | POA: Diagnosis not present

## 2018-10-24 DIAGNOSIS — J84112 Idiopathic pulmonary fibrosis: Secondary | ICD-10-CM | POA: Diagnosis not present

## 2018-10-24 DIAGNOSIS — K449 Diaphragmatic hernia without obstruction or gangrene: Secondary | ICD-10-CM | POA: Diagnosis not present

## 2018-10-24 DIAGNOSIS — Z66 Do not resuscitate: Secondary | ICD-10-CM | POA: Diagnosis not present

## 2018-10-26 DIAGNOSIS — J84112 Idiopathic pulmonary fibrosis: Secondary | ICD-10-CM | POA: Diagnosis not present

## 2018-10-26 DIAGNOSIS — K449 Diaphragmatic hernia without obstruction or gangrene: Secondary | ICD-10-CM | POA: Diagnosis not present

## 2018-10-26 DIAGNOSIS — Z66 Do not resuscitate: Secondary | ICD-10-CM | POA: Diagnosis not present

## 2018-10-29 DIAGNOSIS — K449 Diaphragmatic hernia without obstruction or gangrene: Secondary | ICD-10-CM | POA: Diagnosis not present

## 2018-10-29 DIAGNOSIS — J84112 Idiopathic pulmonary fibrosis: Secondary | ICD-10-CM | POA: Diagnosis not present

## 2018-10-29 DIAGNOSIS — Z66 Do not resuscitate: Secondary | ICD-10-CM | POA: Diagnosis not present

## 2018-10-30 DIAGNOSIS — Z66 Do not resuscitate: Secondary | ICD-10-CM | POA: Diagnosis not present

## 2018-10-30 DIAGNOSIS — K449 Diaphragmatic hernia without obstruction or gangrene: Secondary | ICD-10-CM | POA: Diagnosis not present

## 2018-10-30 DIAGNOSIS — J84112 Idiopathic pulmonary fibrosis: Secondary | ICD-10-CM | POA: Diagnosis not present

## 2018-11-06 DIAGNOSIS — J84112 Idiopathic pulmonary fibrosis: Secondary | ICD-10-CM | POA: Diagnosis not present

## 2018-11-06 DIAGNOSIS — K449 Diaphragmatic hernia without obstruction or gangrene: Secondary | ICD-10-CM | POA: Diagnosis not present

## 2018-11-06 DIAGNOSIS — Z66 Do not resuscitate: Secondary | ICD-10-CM | POA: Diagnosis not present

## 2018-11-09 DIAGNOSIS — K449 Diaphragmatic hernia without obstruction or gangrene: Secondary | ICD-10-CM | POA: Diagnosis not present

## 2018-11-09 DIAGNOSIS — Z66 Do not resuscitate: Secondary | ICD-10-CM | POA: Diagnosis not present

## 2018-11-09 DIAGNOSIS — J84112 Idiopathic pulmonary fibrosis: Secondary | ICD-10-CM | POA: Diagnosis not present

## 2018-11-12 DIAGNOSIS — J84112 Idiopathic pulmonary fibrosis: Secondary | ICD-10-CM | POA: Diagnosis not present

## 2018-11-12 DIAGNOSIS — Z66 Do not resuscitate: Secondary | ICD-10-CM | POA: Diagnosis not present

## 2018-11-12 DIAGNOSIS — K449 Diaphragmatic hernia without obstruction or gangrene: Secondary | ICD-10-CM | POA: Diagnosis not present

## 2018-11-16 DIAGNOSIS — Z66 Do not resuscitate: Secondary | ICD-10-CM | POA: Diagnosis not present

## 2018-11-16 DIAGNOSIS — J84112 Idiopathic pulmonary fibrosis: Secondary | ICD-10-CM | POA: Diagnosis not present

## 2018-11-16 DIAGNOSIS — K449 Diaphragmatic hernia without obstruction or gangrene: Secondary | ICD-10-CM | POA: Diagnosis not present

## 2018-11-18 DIAGNOSIS — Z66 Do not resuscitate: Secondary | ICD-10-CM | POA: Diagnosis not present

## 2018-11-18 DIAGNOSIS — K449 Diaphragmatic hernia without obstruction or gangrene: Secondary | ICD-10-CM | POA: Diagnosis not present

## 2018-11-18 DIAGNOSIS — J84112 Idiopathic pulmonary fibrosis: Secondary | ICD-10-CM | POA: Diagnosis not present

## 2018-11-20 DIAGNOSIS — Z66 Do not resuscitate: Secondary | ICD-10-CM | POA: Diagnosis not present

## 2018-11-20 DIAGNOSIS — K449 Diaphragmatic hernia without obstruction or gangrene: Secondary | ICD-10-CM | POA: Diagnosis not present

## 2018-11-20 DIAGNOSIS — J84112 Idiopathic pulmonary fibrosis: Secondary | ICD-10-CM | POA: Diagnosis not present

## 2018-11-23 DIAGNOSIS — K449 Diaphragmatic hernia without obstruction or gangrene: Secondary | ICD-10-CM | POA: Diagnosis not present

## 2018-11-23 DIAGNOSIS — J84112 Idiopathic pulmonary fibrosis: Secondary | ICD-10-CM | POA: Diagnosis not present

## 2018-11-23 DIAGNOSIS — Z66 Do not resuscitate: Secondary | ICD-10-CM | POA: Diagnosis not present

## 2018-11-28 DIAGNOSIS — J84112 Idiopathic pulmonary fibrosis: Secondary | ICD-10-CM | POA: Diagnosis not present

## 2018-11-28 DIAGNOSIS — Z66 Do not resuscitate: Secondary | ICD-10-CM | POA: Diagnosis not present

## 2018-11-28 DIAGNOSIS — K449 Diaphragmatic hernia without obstruction or gangrene: Secondary | ICD-10-CM | POA: Diagnosis not present

## 2018-11-30 DIAGNOSIS — K449 Diaphragmatic hernia without obstruction or gangrene: Secondary | ICD-10-CM | POA: Diagnosis not present

## 2018-11-30 DIAGNOSIS — J84112 Idiopathic pulmonary fibrosis: Secondary | ICD-10-CM | POA: Diagnosis not present

## 2018-11-30 DIAGNOSIS — Z66 Do not resuscitate: Secondary | ICD-10-CM | POA: Diagnosis not present

## 2018-12-04 DIAGNOSIS — K449 Diaphragmatic hernia without obstruction or gangrene: Secondary | ICD-10-CM | POA: Diagnosis not present

## 2018-12-04 DIAGNOSIS — J84112 Idiopathic pulmonary fibrosis: Secondary | ICD-10-CM | POA: Diagnosis not present

## 2018-12-04 DIAGNOSIS — Z66 Do not resuscitate: Secondary | ICD-10-CM | POA: Diagnosis not present

## 2018-12-07 DIAGNOSIS — J84112 Idiopathic pulmonary fibrosis: Secondary | ICD-10-CM | POA: Diagnosis not present

## 2018-12-07 DIAGNOSIS — K449 Diaphragmatic hernia without obstruction or gangrene: Secondary | ICD-10-CM | POA: Diagnosis not present

## 2018-12-07 DIAGNOSIS — Z66 Do not resuscitate: Secondary | ICD-10-CM | POA: Diagnosis not present

## 2018-12-13 DIAGNOSIS — K449 Diaphragmatic hernia without obstruction or gangrene: Secondary | ICD-10-CM | POA: Diagnosis not present

## 2018-12-13 DIAGNOSIS — Z66 Do not resuscitate: Secondary | ICD-10-CM | POA: Diagnosis not present

## 2018-12-13 DIAGNOSIS — J84112 Idiopathic pulmonary fibrosis: Secondary | ICD-10-CM | POA: Diagnosis not present

## 2018-12-14 DIAGNOSIS — K449 Diaphragmatic hernia without obstruction or gangrene: Secondary | ICD-10-CM | POA: Diagnosis not present

## 2018-12-14 DIAGNOSIS — J84112 Idiopathic pulmonary fibrosis: Secondary | ICD-10-CM | POA: Diagnosis not present

## 2018-12-14 DIAGNOSIS — Z66 Do not resuscitate: Secondary | ICD-10-CM | POA: Diagnosis not present

## 2018-12-18 DIAGNOSIS — K449 Diaphragmatic hernia without obstruction or gangrene: Secondary | ICD-10-CM | POA: Diagnosis not present

## 2018-12-18 DIAGNOSIS — Z66 Do not resuscitate: Secondary | ICD-10-CM | POA: Diagnosis not present

## 2018-12-18 DIAGNOSIS — J84112 Idiopathic pulmonary fibrosis: Secondary | ICD-10-CM | POA: Diagnosis not present

## 2018-12-19 DIAGNOSIS — Z66 Do not resuscitate: Secondary | ICD-10-CM | POA: Diagnosis not present

## 2018-12-19 DIAGNOSIS — K449 Diaphragmatic hernia without obstruction or gangrene: Secondary | ICD-10-CM | POA: Diagnosis not present

## 2018-12-19 DIAGNOSIS — J84112 Idiopathic pulmonary fibrosis: Secondary | ICD-10-CM | POA: Diagnosis not present

## 2018-12-21 DIAGNOSIS — K449 Diaphragmatic hernia without obstruction or gangrene: Secondary | ICD-10-CM | POA: Diagnosis not present

## 2018-12-21 DIAGNOSIS — Z66 Do not resuscitate: Secondary | ICD-10-CM | POA: Diagnosis not present

## 2018-12-21 DIAGNOSIS — J84112 Idiopathic pulmonary fibrosis: Secondary | ICD-10-CM | POA: Diagnosis not present

## 2018-12-25 DIAGNOSIS — J84112 Idiopathic pulmonary fibrosis: Secondary | ICD-10-CM | POA: Diagnosis not present

## 2018-12-25 DIAGNOSIS — K449 Diaphragmatic hernia without obstruction or gangrene: Secondary | ICD-10-CM | POA: Diagnosis not present

## 2018-12-25 DIAGNOSIS — Z66 Do not resuscitate: Secondary | ICD-10-CM | POA: Diagnosis not present

## 2018-12-28 DIAGNOSIS — K449 Diaphragmatic hernia without obstruction or gangrene: Secondary | ICD-10-CM | POA: Diagnosis not present

## 2018-12-28 DIAGNOSIS — Z66 Do not resuscitate: Secondary | ICD-10-CM | POA: Diagnosis not present

## 2018-12-28 DIAGNOSIS — J84112 Idiopathic pulmonary fibrosis: Secondary | ICD-10-CM | POA: Diagnosis not present

## 2019-01-19 DEATH — deceased
